# Patient Record
Sex: Female | Born: 1950
Health system: Southern US, Community
[De-identification: ages and names within clinical notes are randomized; demographics above are authoritative.]

## PROBLEM LIST (undated history)

## (undated) ENCOUNTER — Emergency Department (HOSPITAL_COMMUNITY): Admission: EM | Disposition: A | Discharge status: Home / Self Care | Payer: Medicare Other

## (undated) DIAGNOSIS — E785 Hyperlipidemia, unspecified: Secondary | ICD-10-CM

## (undated) DIAGNOSIS — H409 Unspecified glaucoma: Secondary | ICD-10-CM

## (undated) DIAGNOSIS — R609 Edema, unspecified: Secondary | ICD-10-CM

## (undated) DIAGNOSIS — R059 Cough, unspecified: Secondary | ICD-10-CM

## (undated) DIAGNOSIS — R7989 Other specified abnormal findings of blood chemistry: Secondary | ICD-10-CM

## (undated) DIAGNOSIS — I10 Essential (primary) hypertension: Secondary | ICD-10-CM

## (undated) DIAGNOSIS — M899 Disorder of bone, unspecified: Secondary | ICD-10-CM

## (undated) DIAGNOSIS — R000 Tachycardia, unspecified: Secondary | ICD-10-CM

## (undated) DIAGNOSIS — F209 Schizophrenia, unspecified: Secondary | ICD-10-CM

## (undated) DIAGNOSIS — F17200 Nicotine dependence, unspecified, uncomplicated: Secondary | ICD-10-CM

## (undated) DIAGNOSIS — R05 Cough: Secondary | ICD-10-CM

## (undated) DIAGNOSIS — M949 Disorder of cartilage, unspecified: Secondary | ICD-10-CM

## (undated) DIAGNOSIS — A562 Chlamydial infection of genitourinary tract, unspecified: Secondary | ICD-10-CM

## (undated) DIAGNOSIS — M8580 Other specified disorders of bone density and structure, unspecified site: Secondary | ICD-10-CM

## (undated) HISTORY — DX: Tachycardia, unspecified: R00.0

## (undated) HISTORY — DX: Disorder of cartilage, unspecified: M94.9

## (undated) HISTORY — DX: Cough, unspecified: R05.9

## (undated) HISTORY — DX: Edema, unspecified: R60.9

## (undated) HISTORY — DX: Nicotine dependence, unspecified, uncomplicated: F17.200

## (undated) HISTORY — DX: Essential (primary) hypertension: I10

## (undated) HISTORY — DX: Other specified disorders of bone density and structure, unspecified site: M85.80

## (undated) HISTORY — DX: Schizophrenia, unspecified: F20.9

## (undated) HISTORY — DX: Other specified abnormal findings of blood chemistry: R79.89

## (undated) HISTORY — DX: Chlamydial infection of genitourinary tract, unspecified: A56.2

## (undated) HISTORY — PX: NO PAST SURGERIES: SHX2092

## (undated) HISTORY — DX: Unspecified glaucoma: H40.9

## (undated) HISTORY — PX: BREAST EXCISIONAL BIOPSY: SUR124

## (undated) HISTORY — DX: Disorder of bone, unspecified: M89.9

## (undated) HISTORY — DX: Cough: R05

---

## 1999-04-06 ENCOUNTER — Emergency Department (HOSPITAL_COMMUNITY): Admission: EM | Admit: 1999-04-06 | Discharge: 1999-04-06 | Payer: Self-pay | Admitting: Emergency Medicine

## 1999-04-19 ENCOUNTER — Encounter: Admission: RE | Admit: 1999-04-19 | Discharge: 1999-04-22 | Payer: Self-pay | Admitting: Family Medicine

## 1999-04-23 ENCOUNTER — Encounter: Admission: RE | Admit: 1999-04-23 | Discharge: 1999-05-17 | Payer: Self-pay | Admitting: Family Medicine

## 2005-11-15 ENCOUNTER — Emergency Department (HOSPITAL_COMMUNITY): Admission: EM | Admit: 2005-11-15 | Discharge: 2005-11-15 | Payer: Self-pay | Admitting: Family Medicine

## 2008-05-22 ENCOUNTER — Emergency Department (HOSPITAL_COMMUNITY): Admission: EM | Admit: 2008-05-22 | Discharge: 2008-05-22 | Payer: Self-pay | Admitting: Family Medicine

## 2008-06-22 ENCOUNTER — Emergency Department (HOSPITAL_COMMUNITY): Admission: EM | Admit: 2008-06-22 | Discharge: 2008-06-22 | Payer: Self-pay | Admitting: Family Medicine

## 2010-05-29 ENCOUNTER — Emergency Department (HOSPITAL_COMMUNITY): Admission: EM | Admit: 2010-05-29 | Discharge: 2010-05-29 | Payer: Self-pay | Admitting: Emergency Medicine

## 2010-05-29 DIAGNOSIS — R000 Tachycardia, unspecified: Secondary | ICD-10-CM

## 2010-05-29 HISTORY — DX: Tachycardia, unspecified: R00.0

## 2011-02-04 ENCOUNTER — Encounter (HOSPITAL_COMMUNITY): Payer: 59 | Attending: Endocrinology

## 2011-02-04 DIAGNOSIS — M949 Disorder of cartilage, unspecified: Secondary | ICD-10-CM | POA: Insufficient documentation

## 2011-02-04 DIAGNOSIS — M899 Disorder of bone, unspecified: Secondary | ICD-10-CM | POA: Insufficient documentation

## 2011-04-09 ENCOUNTER — Other Ambulatory Visit (HOSPITAL_COMMUNITY): Payer: Self-pay | Admitting: Endocrinology

## 2011-04-15 ENCOUNTER — Encounter (HOSPITAL_COMMUNITY): Payer: Self-pay

## 2011-04-23 ENCOUNTER — Ambulatory Visit (HOSPITAL_COMMUNITY): Admission: RE | Admit: 2011-04-23 | Payer: Self-pay | Source: Ambulatory Visit

## 2011-04-23 ENCOUNTER — Ambulatory Visit (HOSPITAL_COMMUNITY): Payer: Self-pay

## 2011-04-23 ENCOUNTER — Encounter (HOSPITAL_COMMUNITY)
Admission: RE | Admit: 2011-04-23 | Discharge: 2011-04-23 | Disposition: A | Discharge status: Home / Self Care | Payer: PRIVATE HEALTH INSURANCE | Source: Ambulatory Visit | Attending: Endocrinology | Admitting: Endocrinology

## 2011-04-23 DIAGNOSIS — E213 Hyperparathyroidism, unspecified: Secondary | ICD-10-CM | POA: Insufficient documentation

## 2011-04-23 MED ORDER — TECHNETIUM TC 99M SESTAMIBI - CARDIOLITE
25.0000 | Freq: Once | INTRAVENOUS | Status: AC | PRN
  Administered 2011-04-23: 25 via INTRAVENOUS

## 2011-05-19 ENCOUNTER — Encounter (INDEPENDENT_AMBULATORY_CARE_PROVIDER_SITE_OTHER): Payer: Self-pay | Admitting: Surgery

## 2011-05-20 ENCOUNTER — Ambulatory Visit (INDEPENDENT_AMBULATORY_CARE_PROVIDER_SITE_OTHER): Payer: PRIVATE HEALTH INSURANCE | Admitting: Surgery

## 2011-05-20 ENCOUNTER — Encounter (INDEPENDENT_AMBULATORY_CARE_PROVIDER_SITE_OTHER): Payer: Self-pay | Admitting: Surgery

## 2011-05-20 VITALS — BP 116/68 | HR 60 | Temp 96.9°F | Ht 63.0 in | Wt 150.0 lb

## 2011-05-20 DIAGNOSIS — E210 Primary hyperparathyroidism: Secondary | ICD-10-CM

## 2011-05-20 NOTE — Progress Notes (Signed)
Chief Complaint  Patient presents with  . Parathyroid Adenoma    primary hyperparathyroidism    HISTORY: Patient is a 60 year old female referred by her endocrinologist for evaluation of suspected primary hyperparathyroidism. Patient was initially diagnosed with hypercalcemia by her primary care physician. Calcium levels of range from 11.2-11.6. Intact PTH levels have varied widely over the past several months ranging from a low of 17 to a high of 140 with the most recent level being 80. Patient does have a history of bone density scanning showing osteopenia. She denies nephrolithiasis. She denies fatigue. She denies fractures.  Patient has a history of schizophrenia. She has never taken lithium. She does take vitamin D supplements.  There is no family history of endocrinopathy.  Patient underwent nuclear medicine sestamibi scan on August 1. This study failed to localize evidence of a parathyroid adenoma. Patient is now referred to surgery for further recommendations for management.  Past Medical History  Diagnosis Date  . Hyperparathyroidism   . Schizophrenia   . Hypertension   . Osteopenia   . Vitamin D deficiency   . Cough      Current Outpatient Prescriptions  Medication Sig Dispense Refill  . alendronate (FOSAMAX) 70 MG tablet Take 70 mg by mouth every 7 (seven) days. Take with a full glass of water on an empty stomach.       Marland Kitchen atenolol (TENORMIN) 50 MG tablet Take 50 mg by mouth daily.        . brinzolamide (AZOPT) 1 % ophthalmic suspension Place 1 drop into both eyes 3 (three) times daily.        . cholecalciferol (VITAMIN D) 1000 UNITS tablet Take 2,000 Units by mouth daily.        . enalapril (VASOTEC) 10 MG tablet Take 10 mg by mouth daily.        Marland Kitchen thioridazine (MELLARIL) 50 MG tablet Take 50 mg by mouth 2 (two) times daily.       . Multiple Vitamins-Minerals (MULTIVITAMIN WITH MINERALS) tablet Take 1 tablet by mouth daily.           No Known Allergies   History  reviewed. No pertinent family history.   History   Social History  . Marital Status: Married    Spouse Name: N/A    Number of Children: N/A  . Years of Education: N/A   Social History Main Topics  . Smoking status: Never Smoker   . Smokeless tobacco: None  . Alcohol Use: Yes  . Drug Use: No  . Sexually Active:    Other Topics Concern  . None   Social History Narrative  . None     REVIEW OF SYSTEMS - PERTINENT POSITIVES ONLY: The patient denies fatigue. She does have a history of osteopenia. She denies fractures. She denies nephrolithiasis.   EXAM: Filed Vitals:   05/20/11 0934  BP: 116/68  Pulse: 60  Temp: 96.9 F (36.1 C)    HEENT: normocephalic; pupils equal and reactive; sclerae clear; dentition good; mucous membranes moist NECK:  ; symmetric on extension; no palpable anterior or posterior cervical lymphadenopathy; no supraclavicular masses; no tenderness CHEST: clear to auscultation bilaterally without rales, rhonchi, or wheezes CARDIAC: regular rate and rhythm without significant murmur; peripheral pulses are full EXT:  non-tender without edema; no deformity NEURO: no gross focal deficits; no sign of tremor   LABORATORY RESULTS: See E-Chart for most recent results   RADIOLOGY RESULTS: See E-Chart or I-Site for most recent results   IMPRESSION: #1  primary hyperparathyroidism #2 osteopenia #3 hypertension #4 schizophrenia #5 vitamin D deficiency   PLAN: The patient and I discussed all of the above findings. I have recommended an MRI scan of the neck with contrast in hopes of localizing a parathyroid adenoma. Approximately 15% of parathyroid adenoma fail to localize with nuclear medicine parathyroid scanning.  If the MRI scan indicates an abnormal parathyroid consistent with adenoma, we will consider minimally invasive surgery. If the MRI scan is unrevealing then I think the patient will return in 6 months with repeat laboratory studies her  evaluation.  We will contact the patient with the results of her MRI scan.   Velora Heckler, MD, FACS General & Endocrine Surgery [facility]      Visit Diagnoses: 1. Hyperparathyroidism, primary     Primary Care Physician: Doran Durand, MD

## 2011-05-28 ENCOUNTER — Ambulatory Visit
Admission: RE | Admit: 2011-05-28 | Discharge: 2011-05-28 | Disposition: A | Discharge status: Home / Self Care | Payer: PRIVATE HEALTH INSURANCE | Source: Ambulatory Visit | Attending: Surgery | Admitting: Surgery

## 2011-05-28 DIAGNOSIS — E210 Primary hyperparathyroidism: Secondary | ICD-10-CM

## 2011-12-18 ENCOUNTER — Other Ambulatory Visit: Payer: Self-pay | Admitting: Family Medicine

## 2011-12-18 DIAGNOSIS — A562 Chlamydial infection of genitourinary tract, unspecified: Secondary | ICD-10-CM

## 2011-12-18 DIAGNOSIS — Z1231 Encounter for screening mammogram for malignant neoplasm of breast: Secondary | ICD-10-CM

## 2011-12-18 HISTORY — DX: Chlamydial infection of genitourinary tract, unspecified: A56.2

## 2012-01-12 ENCOUNTER — Ambulatory Visit
Admission: RE | Admit: 2012-01-12 | Discharge: 2012-01-12 | Disposition: A | Discharge status: Home / Self Care | Payer: PRIVATE HEALTH INSURANCE | Source: Ambulatory Visit | Attending: Family Medicine | Admitting: Family Medicine

## 2012-01-12 DIAGNOSIS — Z1231 Encounter for screening mammogram for malignant neoplasm of breast: Secondary | ICD-10-CM

## 2012-01-12 LAB — HM MAMMOGRAPHY

## 2012-01-13 ENCOUNTER — Other Ambulatory Visit: Payer: Self-pay | Admitting: Family Medicine

## 2012-01-13 DIAGNOSIS — R928 Other abnormal and inconclusive findings on diagnostic imaging of breast: Secondary | ICD-10-CM

## 2012-01-20 ENCOUNTER — Other Ambulatory Visit: Payer: Self-pay | Admitting: Family Medicine

## 2012-01-20 ENCOUNTER — Ambulatory Visit
Admission: RE | Admit: 2012-01-20 | Discharge: 2012-01-20 | Disposition: A | Discharge status: Home / Self Care | Payer: PRIVATE HEALTH INSURANCE | Source: Ambulatory Visit | Attending: Family Medicine | Admitting: Family Medicine

## 2012-01-20 DIAGNOSIS — R928 Other abnormal and inconclusive findings on diagnostic imaging of breast: Secondary | ICD-10-CM

## 2012-02-02 ENCOUNTER — Ambulatory Visit
Admission: RE | Admit: 2012-02-02 | Discharge: 2012-02-02 | Disposition: A | Discharge status: Home / Self Care | Payer: PRIVATE HEALTH INSURANCE | Source: Ambulatory Visit | Attending: Family Medicine | Admitting: Family Medicine

## 2012-02-02 ENCOUNTER — Other Ambulatory Visit: Payer: Self-pay | Admitting: Family Medicine

## 2012-02-02 DIAGNOSIS — R928 Other abnormal and inconclusive findings on diagnostic imaging of breast: Secondary | ICD-10-CM

## 2012-04-20 ENCOUNTER — Other Ambulatory Visit: Payer: Self-pay | Admitting: Internal Medicine

## 2012-04-20 DIAGNOSIS — Z780 Asymptomatic menopausal state: Secondary | ICD-10-CM

## 2012-04-26 ENCOUNTER — Ambulatory Visit
Admission: RE | Admit: 2012-04-26 | Discharge: 2012-04-26 | Disposition: A | Discharge status: Home / Self Care | Payer: PRIVATE HEALTH INSURANCE | Source: Ambulatory Visit | Attending: Internal Medicine | Admitting: Internal Medicine

## 2012-04-26 DIAGNOSIS — Z780 Asymptomatic menopausal state: Secondary | ICD-10-CM

## 2012-07-29 ENCOUNTER — Emergency Department (INDEPENDENT_AMBULATORY_CARE_PROVIDER_SITE_OTHER)
Admission: EM | Admit: 2012-07-29 | Discharge: 2012-07-29 | Disposition: A | Discharge status: Home / Self Care | Payer: PRIVATE HEALTH INSURANCE | Source: Home / Self Care | Attending: Emergency Medicine | Admitting: Emergency Medicine

## 2012-07-29 ENCOUNTER — Encounter (HOSPITAL_COMMUNITY): Payer: Self-pay | Admitting: Emergency Medicine

## 2012-07-29 DIAGNOSIS — K5289 Other specified noninfective gastroenteritis and colitis: Secondary | ICD-10-CM

## 2012-07-29 DIAGNOSIS — K529 Noninfective gastroenteritis and colitis, unspecified: Secondary | ICD-10-CM

## 2012-07-29 LAB — POCT I-STAT, CHEM 8
Hemoglobin: 13.3 g/dL (ref 12.0–15.0)
Sodium: 143 mEq/L (ref 135–145)
TCO2: 22 mmol/L (ref 0–100)

## 2012-07-29 MED ORDER — CIPROFLOXACIN HCL 500 MG PO TABS: 500.0000 mg | ORAL_TABLET | Freq: Two times a day (BID) | ORAL | Status: DC

## 2012-07-29 MED ORDER — DIPHENOXYLATE-ATROPINE 2.5-0.025 MG PO TABS: 1.0000 | ORAL_TABLET | Freq: Four times a day (QID) | ORAL | Status: DC | PRN

## 2012-07-29 MED ORDER — OMEPRAZOLE 20 MG PO CPDR: 20.0000 mg | DELAYED_RELEASE_CAPSULE | Freq: Every day | ORAL | Status: DC

## 2012-07-29 MED ORDER — ONDANSETRON 4 MG PO TBDP
ORAL_TABLET | ORAL | Status: AC
  Filled 2012-07-29: qty 2

## 2012-07-29 MED ORDER — ONDANSETRON 4 MG PO TBDP
8.0000 mg | ORAL_TABLET | Freq: Once | ORAL | Status: AC
  Administered 2012-07-29: 8 mg via ORAL

## 2012-07-29 MED ORDER — ONDANSETRON 8 MG PO TBDP: 8.0000 mg | ORAL_TABLET | Freq: Three times a day (TID) | ORAL | Status: DC | PRN

## 2012-07-29 NOTE — ED Provider Notes (Signed)
Chief Complaint  Patient presents with  . Emesis    vomiting and diarrhea started on sat. after eating food from church.     History of Present Illness:   The patient is a 61 year old female who presents with a six-day history of nausea, vomiting, and diarrhea. This came on after she ate a plate of chitterlings that she had purchased at a church. She also has burning epigastric and periumbilical abdominal pain. She denies any fever, chills, blood in the vomitus, coffee-ground emesis, or bilious emesis. She's had no hematochezia or melena. She denies any other suspicious ingestions. She's had no prior history of GI problems. She has hypertension and takes atenolol and enalapril.  Review of Systems:  Other than noted above, the patient denies any of the following symptoms: Systemic:  No fevers, chills, sweats, weight loss or gain, fatigue, or tiredness. ENT:  No nasal congestion, rhinorrhea, or sore throat. Lungs:  No cough, wheezing, or shortness of breath. Cardiac:  No chest pain, syncope, or presyncope. GI:  No abdominal pain, nausea, vomiting, anorexia, diarrhea, constipation, blood in stool or vomitus. GU:  No dysuria, frequency, or urgency.  PMFSH:  Past medical history, family history, social history, meds, and allergies were reviewed.  Physical Exam:   Vital signs:  BP 148/77  Pulse 91  Temp 99 F (37.2 C) (Oral)  Resp 16  SpO2 97% Filed Vitals:   07/29/12 1709 07/29/12 1758 Supine  180011/07/13  Sitting  07/29/12 1801 Standing   BP: 152/74 137/87 148/78 148/77  Pulse: 95 90 91 91  Temp: 99 F (37.2 C)     TempSrc: Oral     Resp: 16     SpO2: 97%      General:  Alert and oriented.  In no distress.  Skin warm and dry.  Good skin turgor, brisk capillary refill. ENT:  No scleral icterus, moist mucous membranes, no oral lesions, pharynx clear. Lungs:  Breath sounds clear and equal bilaterally.  No wheezes, rales, or rhonchi. Heart:  Rhythm regular, without extrasystoles.   No gallops or murmers. Abdomen:  Soft, flat, nondistended. There is slight pain to palpation in the epigastrium and the periumbilical area but no guarding or rebound. No organomegaly or mass. Bowel sounds are hyperactive. Skin: Clear, warm, and dry.  Good turgor.  Brisk capillary refill.  Labs:   Results for orders placed during the hospital encounter of 07/29/12  POCT I-STAT, CHEM 8      Component Value Range   Sodium 143  135 - 145 mEq/L   Potassium 2.9 (*) 3.5 - 5.1 mEq/L   Chloride 107  96 - 112 mEq/L   BUN 6  6 - 23 mg/dL   Creatinine, Ser 1.61  0.50 - 1.10 mg/dL   Glucose, Bld 096 (*) 70 - 99 mg/dL   Calcium, Ion 0.45 (*) 1.12 - 1.23 mmol/L   TCO2 22  0 - 100 mmol/L   Hemoglobin 13.3  12.0 - 15.0 g/dL   HCT 40.9  81.1 - 91.4 %    Assessment:  The encounter diagnosis was Gastroenteritis.  This could be viral although it suspicious that she might have gotten an infection from the plate of chitterlings that she ate.  Plan:   1.  The following meds were prescribed:   New Prescriptions   CIPROFLOXACIN (CIPRO) 500 MG TABLET    Take 1 tablet (500 mg total) by mouth every 12 (twelve) hours.   DIPHENOXYLATE-ATROPINE (LOMOTIL) 2.5-0.025 MG PER TABLET  Take 1 tablet by mouth 4 (four) times daily as needed for diarrhea or loose stools.   OMEPRAZOLE (PRILOSEC) 20 MG CAPSULE    Take 1 capsule (20 mg total) by mouth daily.   ONDANSETRON (ZOFRAN ODT) 8 MG DISINTEGRATING TABLET    Take 1 tablet (8 mg total) by mouth every 8 (eight) hours as needed for nausea.   2.  The patient was instructed in symptomatic care and handouts were given. 3.  The patient was told to return if becoming worse in any way, if no better in 2 or 3 days, and given some red flag symptoms that would indicate earlier return. 4.  The patient was told to take only sips of clear liquids for the next 24 hours and then advance to a b.r.a.t. Diet.      Reuben Likes, MD 07/29/12 2128

## 2012-07-29 NOTE — ED Notes (Signed)
Pt c/o of vomiting episode on Saturday after eating food from church.   Sunday things got better. Pt states that she started to have the same symptoms again today sudden on set  Nausea/vomiting x2 and diarrhea. Pt states she has a burning sensation in her stomach.   Pt has used tums and tylenol with no relief of pain.

## 2012-12-23 ENCOUNTER — Other Ambulatory Visit: Payer: Self-pay

## 2012-12-23 DIAGNOSIS — Z1231 Encounter for screening mammogram for malignant neoplasm of breast: Secondary | ICD-10-CM

## 2013-01-07 ENCOUNTER — Other Ambulatory Visit (HOSPITAL_BASED_OUTPATIENT_CLINIC_OR_DEPARTMENT_OTHER): Payer: Self-pay | Admitting: Internal Medicine

## 2013-01-25 ENCOUNTER — Ambulatory Visit
Admission: RE | Admit: 2013-01-25 | Discharge: 2013-01-25 | Disposition: A | Discharge status: Home / Self Care | Payer: PRIVATE HEALTH INSURANCE | Source: Ambulatory Visit

## 2013-01-25 DIAGNOSIS — Z1231 Encounter for screening mammogram for malignant neoplasm of breast: Secondary | ICD-10-CM

## 2013-02-15 ENCOUNTER — Encounter: Payer: Self-pay | Admitting: *Deleted

## 2013-02-16 ENCOUNTER — Encounter: Payer: Self-pay | Admitting: Internal Medicine

## 2013-02-16 ENCOUNTER — Ambulatory Visit (INDEPENDENT_AMBULATORY_CARE_PROVIDER_SITE_OTHER): Payer: Medicare Other | Admitting: Internal Medicine

## 2013-02-16 VITALS — BP 138/76 | HR 74 | Temp 98.0°F | Resp 15 | Ht 63.0 in | Wt 157.0 lb

## 2013-02-16 DIAGNOSIS — F209 Schizophrenia, unspecified: Secondary | ICD-10-CM

## 2013-02-16 DIAGNOSIS — M1612 Unilateral primary osteoarthritis, left hip: Secondary | ICD-10-CM

## 2013-02-16 DIAGNOSIS — Z0000 Encounter for general adult medical examination without abnormal findings: Secondary | ICD-10-CM

## 2013-02-16 DIAGNOSIS — M169 Osteoarthritis of hip, unspecified: Secondary | ICD-10-CM

## 2013-02-16 DIAGNOSIS — H409 Unspecified glaucoma: Secondary | ICD-10-CM

## 2013-02-16 DIAGNOSIS — I10 Essential (primary) hypertension: Secondary | ICD-10-CM

## 2013-02-16 DIAGNOSIS — K219 Gastro-esophageal reflux disease without esophagitis: Secondary | ICD-10-CM

## 2013-02-16 DIAGNOSIS — M810 Age-related osteoporosis without current pathological fracture: Secondary | ICD-10-CM

## 2013-02-16 DIAGNOSIS — E785 Hyperlipidemia, unspecified: Secondary | ICD-10-CM

## 2013-02-16 NOTE — Progress Notes (Signed)
Subjective:    Patient ID: [name], female    DOB: 1951-02-23, 62 y.o.   MRN: [id number]  Chief Complaint  Patient presents with  . Annual Exam    Complains of Left hip pain   No Known Allergies   HPI  General exam- mammogram 12/23/12 normal. dexa scan 2012 showed ? osteoporosis as per patient. Normal pap smear 11/2011. uptodate with flu and tdap vaccine. Has not taken shingles vaccine she wants to read about it before deciding on taking it.  Left hip pain- She has been having pain in her left hip with trying to change position and then after she gets up, it goes away. Pain is 3/10, intermittent, non radiating, has not taken pain medication, no numbness/ tingling  Follows with dr balan from endocrinology for hyperparathyroidism  Taking her cholesterol medication and bp meds  Reflux symptoms are minimal. No further nausea  Review of Systems  Constitutional: Negative for fever, chills, appetite change and unexpected weight change.  HENT: Negative for hearing loss, ear pain, mouth sores and neck pain.   Eyes: Negative for visual disturbance.       Wears corrective lenses, seen by eye doctor in April 2014, using her eye drops for glaucoma  Respiratory: Negative for cough, chest tightness and shortness of breath.   Cardiovascular: Negative for chest pain, palpitations and leg swelling.  Gastrointestinal: Negative for nausea, vomiting, abdominal pain, diarrhea, constipation and blood in stool.  Endocrine: Negative for polydipsia, polyphagia and polyuria.  Genitourinary: Negative for dysuria, frequency, hematuria, flank pain and vaginal discharge.  Musculoskeletal: Negative for back pain, joint swelling and gait problem.  Skin: Negative for pallor and rash.  Neurological: Negative for dizziness, tremors, light-headedness and headaches.  Hematological: Negative for adenopathy.  Psychiatric/Behavioral: Negative for suicidal ideas, behavioral problems, sleep disturbance and  agitation.    Past Medical History  Diagnosis Date  . Hyperparathyroidism   . Schizophrenia   . Hypertension   . Osteopenia   . Vitamin D deficiency   . Cough   . Other abnormal blood chemistry   . Chlamydia trachomatis infection of unspecified genitourinary site 12/18/2011  . Other and unspecified hyperlipidemia   . Disorder of bone and cartilage, unspecified   . Edema   . Tobacco use disorder   . Tachycardia, unspecified 05/29/2010  . Unspecified glaucoma(365.9)    History reviewed. No pertinent past surgical history.  Family History  Problem Relation Age of Onset  . Hypertension Other   . Diabetes Other   . Cancer Mother   . Cancer Brother     Throat   History   Social History  . Marital Status: Married    Spouse Name: N/A    Number of Children: N/A  . Years of Education: N/A   Occupational History  . Not on file.   Social History Main Topics  . Smoking status: Never Smoker   . Smokeless tobacco: Not on file  . Alcohol Use: Yes  . Drug Use: No  . Sexually Active: Not on file   Other Topics Concern  . Not on file   Social History Narrative  . No narrative on file      Objective:   Physical Exam  Constitutional: She is oriented to person, place, and time. She appears well-developed and well-nourished. No distress.  HENT:  Head: Normocephalic and atraumatic.  Right Ear: External ear normal.  Left Ear: External ear normal.  Nose: Nose normal.  Mouth/Throat: No oropharyngeal exudate.  Eyes: Conjunctivae  and EOM are normal. Pupils are equal, round, and reactive to light. Right eye exhibits no discharge. Left eye exhibits no discharge. No scleral icterus.  Neck: Normal range of motion. Neck supple. No JVD present. No tracheal deviation present. No thyromegaly present.  No carotid bruits  Cardiovascular: Normal rate, regular rhythm, normal heart sounds and intact distal pulses.   No murmur heard. Pulmonary/Chest: Effort normal and breath sounds normal.  No stridor. No respiratory distress. She has no wheezes. She has no rales.  Abdominal: Soft. Bowel sounds are normal. She exhibits no distension and no mass. There is no tenderness. There is no rebound and no guarding.  Genitourinary: Vagina normal. No vaginal discharge found.  Musculoskeletal: Normal range of motion. She exhibits no edema and no tenderness.  Lymphadenopathy:    She has no cervical adenopathy.  Neurological: She is alert and oriented to person, place, and time. She has normal reflexes. No cranial nerve deficit. Coordination normal.  Skin: Skin is warm and dry. No rash noted. She is not diaphoretic. No erythema. No pallor.  Psychiatric: She has a normal mood and affect. Her behavior is normal. Judgment and thought content normal.    BP 138/76  Pulse 74  Temp(Src) 98 F (36.7 C) (Oral)  Resp 15  Ht 5\' 3"  (1.6 m)  Wt 157 lb (71.215 kg)  BMI 27.82 kg/m2  Labs-  12/16/2011 CBC Wbc 9.5 Rbc 4.11 Hemoglobin 12.3  CMP Glucose 101 Bun 9 Creatinine 0.69 Lipid Panel Cholesterol 222 Triglycerides 322 Hdl 45 Ldl 113  TSH 1.960  Vitamin D 25 Hydroxy 24.3  03/23/12 A1c 5.5 10/14/2012 CBC; WBC 6.2, RBC 4.38, Hemoglobin 13.2 CMP; Glucose 104, BUN 8, Creatinine 0.72 Lipid Panel; Cholesterol 155, Triglycerides 200, HDL 57, LDL 58     CBC    Component Value Date/Time   HGB 13.3 07/29/2012 1816   HCT 39.0 07/29/2012 1816    CMP     Component Value Date/Time   NA 143 07/29/2012 1816   K 2.9* 07/29/2012 1816   CL 107 07/29/2012 1816   GLUCOSE 106* 07/29/2012 1816   BUN 6 07/29/2012 1816   CREATININE 0.80 07/29/2012 1816   ekg reviewed, NSR, t wave abnormality in high lateral leads  Assessment & Plan:   Osteoporosis- tolerating alendronate well. Continue current regimen. Weight bearing exercise encouraged. Continue vit d  Hyperparathyroidism- follows with dr balan  HTN- bp well controlled. Continue current regimen. No changes made. Monitor renal function  Hyperlipidemia-  continue current regimen of statin  gerd- symptoms currently under control, continue ppi  Schizophrenia- takes thioridazine and sympotms stable at present. Monitor clinically  Glaucoma- follows with eye MD  General exam- uptodate with mammogram and dexa scan. upotdate with other immunization except shingles.   Left hip pain- likley arthritis pain with minimal discomfort, to take prn tylenol for now and notify if no improvement

## 2013-02-21 ENCOUNTER — Other Ambulatory Visit: Payer: Self-pay | Admitting: *Deleted

## 2013-02-21 ENCOUNTER — Other Ambulatory Visit: Payer: Self-pay | Admitting: Internal Medicine

## 2013-02-23 DIAGNOSIS — M1612 Unilateral primary osteoarthritis, left hip: Secondary | ICD-10-CM | POA: Insufficient documentation

## 2013-02-23 DIAGNOSIS — F209 Schizophrenia, unspecified: Secondary | ICD-10-CM | POA: Insufficient documentation

## 2013-02-23 DIAGNOSIS — K219 Gastro-esophageal reflux disease without esophagitis: Secondary | ICD-10-CM | POA: Insufficient documentation

## 2013-02-23 DIAGNOSIS — Z0000 Encounter for general adult medical examination without abnormal findings: Secondary | ICD-10-CM | POA: Insufficient documentation

## 2013-02-23 DIAGNOSIS — H409 Unspecified glaucoma: Secondary | ICD-10-CM | POA: Insufficient documentation

## 2013-02-23 DIAGNOSIS — M810 Age-related osteoporosis without current pathological fracture: Secondary | ICD-10-CM | POA: Insufficient documentation

## 2013-02-23 DIAGNOSIS — E785 Hyperlipidemia, unspecified: Secondary | ICD-10-CM | POA: Insufficient documentation

## 2013-02-23 DIAGNOSIS — I10 Essential (primary) hypertension: Secondary | ICD-10-CM | POA: Insufficient documentation

## 2013-03-02 LAB — FECAL OCCULT BLOOD, IMMUNOCHEMICAL: Fecal Occult Bld: NEGATIVE

## 2013-03-29 ENCOUNTER — Other Ambulatory Visit: Payer: Self-pay | Admitting: Geriatric Medicine

## 2013-03-29 MED ORDER — ENALAPRIL MALEATE 10 MG PO TABS: 10.0000 mg | ORAL_TABLET | Freq: Every day | ORAL | Status: DC

## 2013-05-04 ENCOUNTER — Other Ambulatory Visit: Payer: Self-pay | Admitting: Geriatric Medicine

## 2013-05-04 MED ORDER — ATORVASTATIN CALCIUM 10 MG PO TABS: 10.0000 mg | ORAL_TABLET | Freq: Every day | ORAL | Status: DC

## 2013-05-06 ENCOUNTER — Other Ambulatory Visit: Payer: Self-pay | Admitting: *Deleted

## 2013-05-06 MED ORDER — ATORVASTATIN CALCIUM 10 MG PO TABS: ORAL_TABLET | ORAL | Status: DC

## 2013-06-10 ENCOUNTER — Encounter: Payer: Self-pay | Admitting: *Deleted

## 2013-06-14 ENCOUNTER — Encounter: Payer: Self-pay | Admitting: Internal Medicine

## 2013-06-14 ENCOUNTER — Ambulatory Visit
Admission: RE | Admit: 2013-06-14 | Discharge: 2013-06-14 | Disposition: A | Discharge status: Home / Self Care | Payer: PRIVATE HEALTH INSURANCE | Source: Ambulatory Visit | Attending: Internal Medicine | Admitting: Internal Medicine

## 2013-06-14 ENCOUNTER — Ambulatory Visit (INDEPENDENT_AMBULATORY_CARE_PROVIDER_SITE_OTHER): Payer: Medicare Other | Admitting: Internal Medicine

## 2013-06-14 VITALS — BP 142/86 | HR 74 | Temp 97.9°F | Wt 159.0 lb

## 2013-06-14 DIAGNOSIS — M79609 Pain in unspecified limb: Secondary | ICD-10-CM

## 2013-06-14 DIAGNOSIS — Z23 Encounter for immunization: Secondary | ICD-10-CM | POA: Insufficient documentation

## 2013-06-14 DIAGNOSIS — M79671 Pain in right foot: Secondary | ICD-10-CM | POA: Insufficient documentation

## 2013-06-14 DIAGNOSIS — I10 Essential (primary) hypertension: Secondary | ICD-10-CM

## 2013-06-14 DIAGNOSIS — E785 Hyperlipidemia, unspecified: Secondary | ICD-10-CM

## 2013-06-14 DIAGNOSIS — K219 Gastro-esophageal reflux disease without esophagitis: Secondary | ICD-10-CM

## 2013-06-14 DIAGNOSIS — M810 Age-related osteoporosis without current pathological fracture: Secondary | ICD-10-CM

## 2013-06-14 MED ORDER — NAPROXEN 500 MG PO TABS: 500.0000 mg | ORAL_TABLET | Freq: Two times a day (BID) | ORAL | Status: DC

## 2013-06-14 NOTE — Patient Instructions (Signed)
  If you do not see improvement in your foot pain by the end of this week or notice further worsening of pain or swelling, please get in touch with the office for further evaluation

## 2013-06-14 NOTE — Progress Notes (Signed)
Patient ID: [name], female   DOB: 1951/05/26, 62 y.o.   MRN: [id number]  Chief Complaint  Patient presents with  . Medical Managment of Chronic Issues    4 month follow-up   . Foot Problem    Right foot injury x 1 month or longer- patient c/o of foot swelling and pain    No Known Allergies  HPI 62 y/o female patient is here for routine follow up.Today she complaints of pain in the right foot since last Thursday. She was at her cousin's funeral and was standing for 2 hours and was wearing heels. Finally when she sat down, she felt her right foot to be hurting. The pain is in medial side of great toe and foot. She has noticed some swelling in that area. She has applied ice pack, soaked in epsom salt and had ACE bandage on. She also took some aleve 220 mg which helped with the pain. At present she complaints of soreness and pain and grades it as 8/10. She also mentions about having trauma in the same foot several month back when meat from the freezer compartment had fallen on her leg. That pain had resolved though. She is using a crutch at present No new change in her diet Denies any known history of gout She is complaint with her medications She also has hx of osteoporosis and hyperparathyroidism and takes fosamax and vtamin d Her left hip pain has resolved Follows with dr Talmage Nap from endocrinology for hyperparathyroidism  Review of Systems  Constitutional: Negative for fever, chills, appetite change and unexpected weight change.  HENT: Negative for hearing loss, ear pain, mouth sores and neck pain.   Eyes: Negative for visual disturbance.        Wears corrective lenses, seen by eye doctor in April 2014, using her eye drops for glaucoma  Respiratory: Negative for cough, chest tightness and shortness of breath.   Cardiovascular: Negative for chest pain, palpitations and leg swelling.  Gastrointestinal: Negative for nausea, vomiting, abdominal pain, diarrhea, constipation and blood in  stool.  Endocrine: Negative for polydipsia, polyphagia and polyuria.  Genitourinary: Negative for dysuria, frequency, hematuria, flank pain and vaginal discharge.  Musculoskeletal: Negative for back pain Skin: Negative for pallor and rash.  Neurological: Negative for dizziness, tremors, light-headedness and headaches.  Hematological: Negative for adenopathy.  Psychiatric/Behavioral: Negative for suicidal ideas, behavioral problems, sleep disturbance and agitation.   Past Medical History  Diagnosis Date  . Hyperparathyroidism   . Schizophrenia   . Hypertension   . Osteopenia   . Vitamin D deficiency   . Cough   . Other abnormal blood chemistry   . Chlamydia trachomatis infection of unspecified genitourinary site 12/18/2011  . Other and unspecified hyperlipidemia   . Disorder of bone and cartilage, unspecified   . Edema   . Tobacco use disorder   . Tachycardia, unspecified 05/29/2010  . Unspecified glaucoma(365.9)    History reviewed. No pertinent past surgical history.  Medication reviewed  Physical Exam   BP 142/86  Pulse 74  Temp(Src) 97.9 F (36.6 C) (Oral)  Wt 159 lb (72.122 kg)  BMI 28.17 kg/m2  SpO2 99%  Constitutional: She is oriented to person, place, and time. She appears well-developed and well-nourished. No distress.  HENT:   Head: Normocephalic and atraumatic.  Nose: Nose normal.   Mouth/Throat: No oropharyngeal exudate.  Eyes: Conjunctivae and EOM are normal. Pupils are equal, round, and reactive to light. Right eye exhibits no discharge. Left eye exhibits no discharge.  No scleral icterus.  Neck: Normal range of motion. Neck supple. No JVD present. No tracheal deviation present. No thyromegaly present.  Cardiovascular: Normal rate, regular rhythm, normal heart sounds and intact distal pulses.    No murmur heard. Pulmonary/Chest: Effort normal and breath sounds normal. No stridor. No respiratory distress. She has no wheezes. She has no rales.  Abdominal:  Soft. Bowel sounds are normal. She exhibits no distension and no mass. There is no tenderness. There is no rebound and no guarding.  Musculoskeletal: tenderness on right foot in the metatarsal area, swelling noted, no warmth. Has discomfort on great toe flexion. Good dorsalis pedis pulse  Lymphadenopathy:    She has no cervical adenopathy.  Neurological: She is alert and oriented to person, place, and time. She has normal reflexes. No cranial nerve deficit. Coordination normal.  Skin: Skin is warm and dry. No rash noted. She is not diaphoretic. No erythema. No pallor.  Psychiatric: She has a normal mood and affect. Her behavior is normal. Judgment and thought content normal.    Assessment/plan  Right foot pain- likely from trauma with her prolonged standing on her heels causing inflammation and possible tendonitis.will get foot xray to rule out metatarsal fracture given her osteoporosis and hyperparathyroidism history.  Another possibility is acute gout. Check uric acid level. To avid weight bearing until xray rules out fracture, foot rest and naprosyn 500 mg bid for 5 days, heat pack. Reassess if no improvement  Osteoporosis- tolerating alendronate and vitamin d well. Continue current regimen.  HTN- bp well controlled. Continue current regimen. No changes made. Monitor renal function  gerd- symptoms currently under control, continue ppi

## 2013-06-15 ENCOUNTER — Telehealth: Payer: Self-pay

## 2013-06-15 ENCOUNTER — Ambulatory Visit: Payer: Medicare Other | Admitting: Internal Medicine

## 2013-06-15 MED ORDER — ALLOPURINOL 100 MG PO TABS: 100.0000 mg | ORAL_TABLET | Freq: Three times a day (TID) | ORAL | Status: DC

## 2013-06-15 MED ORDER — ALLOPURINOL 100 MG PO TABS: 100.0000 mg | ORAL_TABLET | Freq: Every day | ORAL | Status: DC

## 2013-06-15 NOTE — Telephone Encounter (Signed)
Message copied by Maurice Small on Wed Jun 15, 2013  1:28 PM ------      Message from: Kimber Relic      Created: Wed Jun 15, 2013 11:17 AM       Call patient. Uric acid is high. This is consistent with acute gout. Start allopurinol 100 mg (#100) One 3 times daily to help gout. Continue naprosyn until pain is better. ------

## 2013-06-15 NOTE — Telephone Encounter (Signed)
Discussed with patient, patient verbalized understanding of results. RX sent to pharmacy   

## 2013-06-24 ENCOUNTER — Other Ambulatory Visit (HOSPITAL_BASED_OUTPATIENT_CLINIC_OR_DEPARTMENT_OTHER): Payer: Self-pay | Admitting: Internal Medicine

## 2013-09-06 ENCOUNTER — Encounter: Payer: Medicare Other | Admitting: Internal Medicine

## 2013-09-08 NOTE — Progress Notes (Signed)
This encounter was created in error - please disregard.

## 2013-09-14 ENCOUNTER — Other Ambulatory Visit: Payer: Self-pay | Admitting: Nurse Practitioner

## 2013-09-21 ENCOUNTER — Other Ambulatory Visit: Payer: Self-pay | Admitting: Internal Medicine

## 2013-11-23 ENCOUNTER — Other Ambulatory Visit: Payer: Self-pay | Admitting: Internal Medicine

## 2013-12-20 ENCOUNTER — Encounter: Payer: Self-pay | Admitting: Internal Medicine

## 2013-12-20 ENCOUNTER — Ambulatory Visit (INDEPENDENT_AMBULATORY_CARE_PROVIDER_SITE_OTHER): Payer: Medicare Other | Admitting: Internal Medicine

## 2013-12-20 VITALS — BP 120/70 | HR 76 | Temp 99.5°F | Wt 154.0 lb

## 2013-12-20 DIAGNOSIS — E785 Hyperlipidemia, unspecified: Secondary | ICD-10-CM

## 2013-12-20 DIAGNOSIS — I10 Essential (primary) hypertension: Secondary | ICD-10-CM

## 2013-12-20 DIAGNOSIS — M810 Age-related osteoporosis without current pathological fracture: Secondary | ICD-10-CM

## 2013-12-20 DIAGNOSIS — H409 Unspecified glaucoma: Secondary | ICD-10-CM

## 2013-12-20 DIAGNOSIS — K219 Gastro-esophageal reflux disease without esophagitis: Secondary | ICD-10-CM

## 2013-12-20 MED ORDER — ATENOLOL 50 MG PO TABS: 50.0000 mg | ORAL_TABLET | Freq: Every day | ORAL | Status: DC

## 2013-12-20 NOTE — Progress Notes (Signed)
Patient ID: [name], female   DOB: 1950/11/20, 63 y.o.   MRN: [id number]     Chief Complaint  Patient presents with  . Medical Managment of Chronic Issues    Follow-up (last OV 06/14/2014)   No Known Allergies  HPI 63 y/o female patient is here for routine follow up. She denies any complaints this visit. She has been walking for exercise and has been careful about her diet. She has cut down on salt intake and eats more vegetables. She is complaint with her medications. She has history of hypertension and osteoporosis. She Follows with dr Talmage Nap from endocrinology for hyperparathyroidism  Review of Systems   Constitutional: Negative for fever, chills, appetite change. She has been exercising and lost 5 lbs since last visit. HENT: Negative for hearing loss, ear pain, mouth sores and neck pain.    Eyes: Negative for visual disturbance. Wears corrective lenses, seen by eye doctor feb 2015.using her eye drops for glaucoma  Respiratory: Negative for cough, chest tightness and shortness of breath.    Cardiovascular: Negative for chest pain, palpitations and leg swelling.   Gastrointestinal: Negative for nausea, vomiting, abdominal pain, diarrhea, constipation and blood in stool.   Endocrine: Negative for polydipsia, polyphagia and polyuria.   Genitourinary: Negative for dysuria, frequency, hematuria, flank pain and vaginal discharge.   Musculoskeletal: Negative for back pain Skin: Negative for pallor and rash.   Neurological: Negative for dizziness, tremors, light-headedness and headaches.   Hematological: Negative for adenopathy.   Psychiatric/Behavioral: Negative for suicidal ideas, behavioral problems, sleep disturbance and agitation.     Past Medical History  Diagnosis Date  . Hyperparathyroidism   . Schizophrenia   . Hypertension   . Osteopenia   . Vitamin D deficiency   . Cough   . Other abnormal blood chemistry   . Chlamydia trachomatis infection of unspecified  genitourinary site 12/18/2011  . Other and unspecified hyperlipidemia   . Disorder of bone and cartilage, unspecified   . Edema   . Tobacco use disorder   . Tachycardia, unspecified 05/29/2010  . Unspecified glaucoma    Current Outpatient Prescriptions on File Prior to Visit  Medication Sig Dispense Refill  . alendronate (FOSAMAX) 70 MG tablet Take 70 mg by mouth every 7 (seven) days. Take 1 tablet once weekly to treat osteoporosis.Take with a full glass of water on an empty stomach.      Marland Kitchen atenolol (TENORMIN) 50 MG tablet TAKE ONE AND A HALF TABLET BY MOUTH DAILY FOR BLOOD PRESSURE  45 tablet  5  . atorvastatin (LIPITOR) 10 MG tablet Take one tablet once a day at bedtime for cholesterol  30 tablet  5  . bimatoprost (LUMIGAN) 0.03 % ophthalmic solution 1 drop at bedtime. One drop in both eyes once a day for glaucoma      . brinzolamide (AZOPT) 1 % ophthalmic suspension Place 1 drop into both eyes 3 (three) times daily.        . cholecalciferol (VITAMIN D) 1000 UNITS tablet Take 2,000 Units by mouth daily.        . enalapril (VASOTEC) 10 MG tablet TAKE 1 TABLET (10 MG TOTAL) BY MOUTH DAILY.  30 tablet  3  . Multiple Vitamins-Minerals (MULTIVITAMIN WITH MINERALS) tablet Take 1 tablet by mouth daily. Take 1/2 tablet daily.      Marland Kitchen omeprazole (PRILOSEC) 20 MG capsule Take 1 capsule (20 mg total) by mouth daily.  15 capsule  0  . thioridazine (MELLARIL) 50 MG tablet Take  50 mg by mouth. Take 2 tablets at bedtime.       No current facility-administered medications on file prior to visit.   History reviewed. No pertinent past surgical history.  Physical exam BP 120/70  Pulse 76  Temp(Src) 99.5 F (37.5 C) (Oral)  Wt 154 lb (69.854 kg)  SpO2 98%  Constitutional: She is oriented to person, place, and time. She appears well-developed and well-nourished. No distress.  HENT:   Head: Normocephalic and atraumatic.  Nose: Nose normal.   Mouth/Throat: No oropharyngeal exudate.  Eyes:  Conjunctivae and EOM are normal. Pupils are equal, round, and reactive to light. Right eye exhibits no discharge. Left eye exhibits no discharge. No scleral icterus.  Neck: Normal range of motion. Neck supple. No JVD present. No tracheal deviation present. No thyromegaly present.No carotid bruits  Cardiovascular: Normal rate, regular rhythm, normal heart sounds and intact distal pulses.    No murmur heard. Pulmonary/Chest: Effort normal and breath sounds normal. No stridor. No respiratory distress. She has no wheezes. She has no rales.  Abdominal: Soft. Bowel sounds are normal. She exhibits no distension and no mass.  Musculoskeletal: Normal range of motion. She exhibits no edema and no tenderness.  Lymphadenopathy: She has no cervical adenopathy.  Neurological: She is alert and oriented to person, place, and time.  Skin: Skin is warm and dry. No rash noted. She is not diaphoretic. No erythema. No pallor.  Psychiatric: She has a normal mood and affect. Her behavior is normal. Judgment and thought content normal.   Labs- CBC    Component Value Date/Time   HGB 13.3 07/29/2012 1816   HCT 39.0 07/29/2012 1816    CMP     Component Value Date/Time   NA 143 07/29/2012 1816   K 2.9* 07/29/2012 1816   CL 107 07/29/2012 1816   GLUCOSE 106* 07/29/2012 1816   BUN 6 07/29/2012 1816   CREATININE 0.80 07/29/2012 1816     Assessment/plan  1. Unspecified essential hypertension Improved bp reading. Has lost weight, congratulated on exercising and losing weight. Will continue enalapril for now and decrease atenolol to 50 from 75 mg daily - CMP  2. GERD (gastroesophageal reflux disease) Stable. Continue prilosec 20 mg daily for now and  Consider tapering off if tolerated next visit - CBC with Differential  3. Osteoporosis, unspecified Stable. Continue alendronate for now with vitamin d - Vitamin D, 1,25-dihydroxy  4. Hyperlipidemia Continue lipitor 10 mg daily and check lipid panel - Lipid  Panel  5. Glaucoma Continue azopt and lumigan for now

## 2013-12-22 ENCOUNTER — Telehealth: Payer: Self-pay

## 2013-12-22 DIAGNOSIS — E876 Hypokalemia: Secondary | ICD-10-CM

## 2013-12-22 LAB — COMPREHENSIVE METABOLIC PANEL
A/G RATIO: 2.2 (ref 1.1–2.5)
ALT: 52 IU/L — AB (ref 0–32)
AST: 55 IU/L — AB (ref 0–40)
Albumin: 4.2 g/dL (ref 3.6–4.8)
Alkaline Phosphatase: 71 IU/L (ref 39–117)
BUN/Creatinine Ratio: 7 — ABNORMAL LOW (ref 11–26)
BUN: 5 mg/dL — ABNORMAL LOW (ref 8–27)
CHLORIDE: 106 mmol/L (ref 97–108)
CO2: 24 mmol/L (ref 18–29)
Calcium: 11.2 mg/dL — ABNORMAL HIGH (ref 8.7–10.3)
Creatinine, Ser: 0.68 mg/dL (ref 0.57–1.00)
GFR calc Af Amer: 109 mL/min/{1.73_m2} (ref >59)
GFR, EST NON AFRICAN AMERICAN: 95 mL/min/{1.73_m2} (ref >59)
GLUCOSE: 96 mg/dL (ref 65–99)
Globulin, Total: 1.9 g/dL (ref 1.5–4.5)
POTASSIUM: 3 mmol/L — AB (ref 3.5–5.2)
SODIUM: 149 mmol/L — AB (ref 134–144)
Total Bilirubin: 0.8 mg/dL (ref 0.0–1.2)
Total Protein: 6.1 g/dL (ref 6.0–8.5)

## 2013-12-22 LAB — CBC WITH DIFFERENTIAL/PLATELET
Basophils Absolute: 0 10*3/uL (ref 0.0–0.2)
Basos: 0 %
EOS ABS: 0 10*3/uL (ref 0.0–0.4)
EOS: 1 %
HEMATOCRIT: 37.2 % (ref 34.0–46.6)
Hemoglobin: 12.8 g/dL (ref 11.1–15.9)
IMMATURE GRANS (ABS): 0 10*3/uL (ref 0.0–0.1)
IMMATURE GRANULOCYTES: 0 %
LYMPHS ABS: 3.7 10*3/uL — AB (ref 0.7–3.1)
Lymphs: 52 %
MCH: 31.3 pg (ref 26.6–33.0)
MCHC: 34.4 g/dL (ref 31.5–35.7)
MCV: 91 fL (ref 79–97)
MONOS ABS: 0.5 10*3/uL (ref 0.1–0.9)
Monocytes: 7 %
NEUTROS PCT: 40 %
Neutrophils Absolute: 2.8 10*3/uL (ref 1.4–7.0)
RBC: 4.09 x10E6/uL (ref 3.77–5.28)
RDW: 14.4 % (ref 12.3–15.4)
WBC: 7.1 10*3/uL (ref 3.4–10.8)

## 2013-12-22 LAB — LIPID PANEL
CHOLESTEROL TOTAL: 128 mg/dL (ref 100–199)
Chol/HDL Ratio: 2 ratio units (ref 0.0–4.4)
HDL: 63 mg/dL (ref >39)
LDL Calculated: 32 mg/dL (ref 0–99)
TRIGLYCERIDES: 165 mg/dL — AB (ref 0–149)
VLDL CHOLESTEROL CAL: 33 mg/dL (ref 5–40)

## 2013-12-22 LAB — VITAMIN D 1,25 DIHYDROXY: Vit D, 1,25-Dihydroxy: 36.8 pg/mL (ref 19.9–79.3)

## 2013-12-22 MED ORDER — POTASSIUM CHLORIDE CRYS ER 20 MEQ PO TBCR: 20.0000 meq | EXTENDED_RELEASE_TABLET | Freq: Two times a day (BID) | ORAL | Status: DC

## 2013-12-22 NOTE — Telephone Encounter (Signed)
Discussed with patient, [facility] scheduled appointment for labs on 01/03/14 (patient will not pick-up rx until 12/27/12). Future order placed for labs

## 2013-12-22 NOTE — Telephone Encounter (Signed)
Message copied by Maurice SmallBEATTY, Carsin Randazzo C on Thu Dec 22, 2013  4:03 PM ------      Message from: Oneal GroutPANDEY, MAHIMA      Created: Wed Dec 21, 2013  7:00 PM       Pending vitamin d level. You have low potassium level. Send script for potassium chloride 20 meq twice a day for 1 week and recheck bmp. Your sodium level is high. I want you to keep yourself hydrated for now and cut down salt intake. ------

## 2014-01-03 ENCOUNTER — Other Ambulatory Visit: Payer: Medicare Other

## 2014-01-03 DIAGNOSIS — E876 Hypokalemia: Secondary | ICD-10-CM

## 2014-01-04 LAB — BASIC METABOLIC PANEL
BUN/Creatinine Ratio: 9 — ABNORMAL LOW (ref 11–26)
BUN: 6 mg/dL — ABNORMAL LOW (ref 8–27)
CO2: 25 mmol/L (ref 18–29)
Calcium: 11.3 mg/dL — ABNORMAL HIGH (ref 8.7–10.3)
Chloride: 108 mmol/L (ref 97–108)
Creatinine, Ser: 0.68 mg/dL (ref 0.57–1.00)
GFR calc Af Amer: 109 mL/min/{1.73_m2} (ref >59)
GFR calc non Af Amer: 95 mL/min/{1.73_m2} (ref >59)
Glucose: 136 mg/dL — ABNORMAL HIGH (ref 65–99)
Potassium: 4.1 mmol/L (ref 3.5–5.2)
Sodium: 147 mmol/L — ABNORMAL HIGH (ref 134–144)

## 2014-02-07 ENCOUNTER — Other Ambulatory Visit: Payer: Self-pay

## 2014-02-07 DIAGNOSIS — Z1231 Encounter for screening mammogram for malignant neoplasm of breast: Secondary | ICD-10-CM

## 2014-02-22 ENCOUNTER — Encounter (INDEPENDENT_AMBULATORY_CARE_PROVIDER_SITE_OTHER): Payer: Self-pay

## 2014-02-22 ENCOUNTER — Ambulatory Visit
Admission: RE | Admit: 2014-02-22 | Discharge: 2014-02-22 | Disposition: A | Discharge status: Home / Self Care | Payer: Medicare Other | Source: Ambulatory Visit

## 2014-02-22 DIAGNOSIS — Z1231 Encounter for screening mammogram for malignant neoplasm of breast: Secondary | ICD-10-CM

## 2014-03-13 ENCOUNTER — Other Ambulatory Visit: Payer: Self-pay | Admitting: Internal Medicine

## 2014-03-15 ENCOUNTER — Ambulatory Visit (INDEPENDENT_AMBULATORY_CARE_PROVIDER_SITE_OTHER): Payer: Medicare Other | Admitting: Internal Medicine

## 2014-03-15 ENCOUNTER — Encounter: Payer: Self-pay | Admitting: Internal Medicine

## 2014-03-15 VITALS — BP 134/60 | HR 96 | Temp 98.2°F | Resp 20 | Ht 63.39 in | Wt 152.6 lb

## 2014-03-15 DIAGNOSIS — R739 Hyperglycemia, unspecified: Secondary | ICD-10-CM

## 2014-03-15 DIAGNOSIS — M810 Age-related osteoporosis without current pathological fracture: Secondary | ICD-10-CM

## 2014-03-15 DIAGNOSIS — H409 Unspecified glaucoma: Secondary | ICD-10-CM

## 2014-03-15 DIAGNOSIS — E876 Hypokalemia: Secondary | ICD-10-CM

## 2014-03-15 DIAGNOSIS — I10 Essential (primary) hypertension: Secondary | ICD-10-CM

## 2014-03-15 DIAGNOSIS — F209 Schizophrenia, unspecified: Secondary | ICD-10-CM

## 2014-03-15 DIAGNOSIS — R7309 Other abnormal glucose: Secondary | ICD-10-CM

## 2014-03-15 DIAGNOSIS — Z0000 Encounter for general adult medical examination without abnormal findings: Secondary | ICD-10-CM

## 2014-03-15 DIAGNOSIS — E785 Hyperlipidemia, unspecified: Secondary | ICD-10-CM

## 2014-03-15 DIAGNOSIS — K219 Gastro-esophageal reflux disease without esophagitis: Secondary | ICD-10-CM

## 2014-03-15 MED ORDER — ZOSTER VACCINE LIVE 19400 UNT/0.65ML ~~LOC~~ SOLR: 0.6500 mL | Freq: Once | SUBCUTANEOUS | Status: DC

## 2014-03-15 MED ORDER — PNEUMOCOCCAL 13-VAL CONJ VACC IM SUSP: 0.5000 mL | INTRAMUSCULAR | Status: DC

## 2014-03-15 NOTE — Progress Notes (Signed)
Patient ID: [name], female   DOB: 26-Mar-1951, 63 y.o.   MRN: [id number]    Chief Complaint  Patient presents with  . Annual Exam   No Known Allergies  HPI 63 y/o female patient is here for annual exam. She has history of HTN, GERD, primary hyperparathyroidism, osteoporosis, schizophrenia, OA, glaucoma. Last pap smear 2013 normal Never had colonoscopy before. Denies melena, rectal bleed or constipation No falls reported Mood has been good. Smoking persists. Does not want to quit Denies leg pain while walking. No skin color change Denies sores/ wounds in legs dexa scan 2013 osteoporosis- no record for review Her reflux symptom have resolved completely  Review of Systems  Constitutional: Negative for fever, chills, malaise/fatigue and diaphoresis.  HENT: Negative for congestion, hearing loss and sore throat.   Eyes: Negative for blurred vision, double vision and discharge.  Respiratory: Negative for cough, sputum production, shortness of breath and wheezing.   Cardiovascular: Negative for chest pain, palpitations, orthopnea and leg swelling.  Gastrointestinal: Negative for heartburn, nausea, vomiting, abdominal pain, diarrhea and constipation.  Genitourinary: Negative for dysuria, urgency, frequency and flank pain.  Musculoskeletal: Negative for back pain, falls, joint pain and myalgias.  Skin: Negative for itching and rash.  Neurological: Negative for dizziness, tingling, focal weakness and headaches.  Psychiatric/Behavioral: Negative for depression and memory loss. The patient is not nervous/anxious.    Past Medical History  Diagnosis Date  . Hyperparathyroidism   . Schizophrenia   . Hypertension   . Osteopenia   . Vitamin D deficiency   . Cough   . Other abnormal blood chemistry   . Chlamydia trachomatis infection of unspecified genitourinary site 12/18/2011  . Other and unspecified hyperlipidemia   . Disorder of bone and cartilage, unspecified   . Edema   .  Tobacco use disorder   . Tachycardia, unspecified 05/29/2010  . Unspecified glaucoma    History reviewed. No pertinent past surgical history. Family History  Problem Relation Age of Onset  . Hypertension Other   . Diabetes Other   . Cancer Mother   . Cancer Brother     Throat   History   Social History  . Marital Status: Married    Spouse Name: N/A    Number of Children: N/A  . Years of Education: N/A   Occupational History  . Not on file.   Social History Main Topics  . Smoking status: Former Smoker    Quit date: 09/22/2012  . Smokeless tobacco: Not on file     Comment: Quite age 63   . Alcohol Use: Yes  . Drug Use: No  . Sexual Activity: Not on file   Other Topics Concern  . Not on file   Social History Narrative  . No narrative on file   Current Outpatient Prescriptions on File Prior to Visit  Medication Sig Dispense Refill  . atenolol (TENORMIN) 50 MG tablet Take 1 tablet (50 mg total) by mouth daily.  30 tablet  5  . atorvastatin (LIPITOR) 10 MG tablet Take one tablet once a day at bedtime for cholesterol  30 tablet  5  . bimatoprost (LUMIGAN) 0.03 % ophthalmic solution 1 drop at bedtime. One drop in both eyes once a day for glaucoma      . brinzolamide (AZOPT) 1 % ophthalmic suspension Place 1 drop into both eyes 3 (three) times daily.        . cholecalciferol (VITAMIN D) 1000 UNITS tablet Take 2,000 Units by mouth daily.        .Marland Kitchen  enalapril (VASOTEC) 10 MG tablet TAKE 1 TABLET (10 MG TOTAL) BY MOUTH DAILY.  30 tablet  3  . Multiple Vitamins-Minerals (MULTIVITAMIN WITH MINERALS) tablet Take 1 tablet by mouth daily. Take 1/2 tablet daily.      Marland Kitchen. omeprazole (PRILOSEC) 20 MG capsule Take 1 capsule (20 mg total) by mouth daily.  15 capsule  0  . potassium chloride SA (K-DUR,KLOR-CON) 20 MEQ tablet Take 1 tablet (20 mEq total) by mouth 2 (two) times daily.  60 tablet  0  . thioridazine (MELLARIL) 50 MG tablet Take 50 mg by mouth. Take 2 tablets at bedtime.      Marland Kitchen.  alendronate (FOSAMAX) 70 MG tablet Take 70 mg by mouth every 7 (seven) days. Take 1 tablet once weekly to treat osteoporosis.Take with a full glass of water on an empty stomach.       No current facility-administered medications on file prior to visit.     Physical exam BP 134/60  Pulse 96  Temp(Src) 98.2 F (36.8 C) (Oral)  Resp 20  Ht 5' 3.39" (1.61 m)  Wt 152 lb 9.6 oz (69.219 kg)  BMI 26.70 kg/m2  SpO2 98%  Wt Readings from Last 3 Encounters:  03/15/14 152 lb 9.6 oz (69.219 kg)  12/20/13 154 lb (69.854 kg)  06/14/13 159 lb (72.122 kg)   Constitutional: She is oriented to person, place, and time. She appears well-developed and well-nourished. No distress. She is overweight HENT:   Head: Normocephalic and atraumatic.  Nose: Nose normal.   Mouth/Throat: No oropharyngeal exudate. Upper and lower dentures present Eyes: Conjunctivae and EOM are normal. Pupils are equal, round, and reactive to light. Right eye exhibits no discharge. Left eye exhibits no discharge. No scleral icterus. Wears glasses Neck: Normal range of motion. Neck supple. No JVD present. No tracheal deviation present. No thyromegaly present.No carotid bruits   Cardiovascular: Normal rate, regular rhythm, normal heart sounds and intact distal pulses.    No murmur heard. Pulmonary/Chest: Effort normal and breath sounds normal. No stridor. No respiratory distress. She has no wheezes. She has no rales.   Abdominal: Soft. Bowel sounds are normal. She exhibits no distension and no mass.   Musculoskeletal: Normal range of motion. She exhibits no edema and no tenderness.  Lymphadenopathy: She has no cervical adenopathy.  Neurological: She is alert and oriented to person, place, and time.   Skin: Skin is warm and dry. No rash noted. She is not diaphoretic. No erythema. No pallor.  Psychiatric: She has a normal mood and affect. Her behavior is normal. Judgment and thought content normal  imaging 03/15/14 MMSE 26/30, failed  clock draw 03/15/14 EKG normal sinus rhythm, T wave abnormality in high lateral leads  Labs- Lab Results  Component Value Date   WBC 7.1 12/20/2013   HGB 12.8 12/20/2013   HCT 37.2 12/20/2013   MCV 91 12/20/2013   CMP     Component Value Date/Time   NA 147* 01/03/2014 0855   NA 143 07/29/2012 1816   K 4.1 01/03/2014 0855   CL 108 01/03/2014 0855   CO2 25 01/03/2014 0855   GLUCOSE 136* 01/03/2014 0855   GLUCOSE 106* 07/29/2012 1816   BUN 6* 01/03/2014 0855   BUN 6 07/29/2012 1816   CREATININE 0.68 01/03/2014 0855   CALCIUM 11.3* 01/03/2014 0855   PROT 6.1 12/20/2013 1537   AST 55* 12/20/2013 1537   ALT 52* 12/20/2013 1537   ALKPHOS 71 12/20/2013 1537   BILITOT 0.8 12/20/2013 1537  GFRNONAA 95 01/03/2014 0855   GFRAA 109 01/03/2014 0855   Lipid Panel     Component Value Date/Time   TRIG 165* 12/20/2013 1537   HDL 63 12/20/2013 1537   CHOLHDL 2.0 12/20/2013 1537   LDLCALC 32 12/20/2013 1537    Immunization History  Administered Date(s) Administered  . Influenza,inj,Quad PF,36+ Mos 06/14/2013  . Td 09/23/1995  . Tdap 12/18/2011   Assessment/plan  1. Unspecified essential hypertension Stable readings. continue enalapril 10 mg daily with atenolol 50 mg daily  2. Gastroesophageal reflux disease, esophagitis presence not specified Resolved. D/c omerpazole for now with her hx of osteoporosis and absence of symptom  3. Osteoporosis, unspecified Continue weekly alendronate and vit d supplement  4. Glaucoma Stable, continue her lumigan and azopt  5. Hyperlipidemia Continue lipitor 10 mg daily. Monitor clinically  6. Schizophrenia, unspecified type Stable. Continue thioridazine 50 mg daily  7. Routine general medical examination at a health care facility the patient was counseled regarding the appropriate use of alcohol, regular self-examination of the breasts on a monthly basis, prevention of dental and periodontal disease, diet, regular sustained exercise for at least 30 minutes 5  times per week, routine screening interval for mammogram as recommended by the American Cancer Society and ACOG, the proper use of sunscreen and protective clothing, tobacco use, and recommended schedule for GI hemoccult testing, colonoscopy, cholesterol, thyroid and diabetes screening. Pneumococcal vaccine prevnar 13 provided today. Script for zostavax sent to pharmacy. Negative depression screening. FOBT card provided for screening for colon cancer. Last pap 2013 normal  8. Hyperglycemia Check a1c with her hyperglycemia and overweight with history fo HTN - Hemoglobin A1c - Basic Metabolic Panel  9. Hypokalemia Off kcl supplement. Check bmp - Basic Metabolic Panel

## 2014-03-15 NOTE — Progress Notes (Signed)
Clock not passed

## 2014-03-16 LAB — BASIC METABOLIC PANEL
BUN/Creatinine Ratio: 7 — ABNORMAL LOW (ref 11–26)
BUN: 7 mg/dL — ABNORMAL LOW (ref 8–27)
CALCIUM: 11.7 mg/dL — AB (ref 8.7–10.3)
CO2: 21 mmol/L (ref 18–29)
Chloride: 106 mmol/L (ref 97–108)
Creatinine, Ser: 0.96 mg/dL (ref 0.57–1.00)
GFR, EST AFRICAN AMERICAN: 74 mL/min/{1.73_m2} (ref >59)
GFR, EST NON AFRICAN AMERICAN: 64 mL/min/{1.73_m2} (ref >59)
Glucose: 113 mg/dL — ABNORMAL HIGH (ref 65–99)
POTASSIUM: 3.7 mmol/L (ref 3.5–5.2)
SODIUM: 144 mmol/L (ref 134–144)

## 2014-03-16 LAB — HEMOGLOBIN A1C
ESTIMATED AVERAGE GLUCOSE: 117 mg/dL
Hgb A1c MFr Bld: 5.7 % — ABNORMAL HIGH (ref 4.8–5.6)

## 2014-03-17 ENCOUNTER — Telehealth: Payer: Self-pay | Admitting: *Deleted

## 2014-03-17 NOTE — Telephone Encounter (Signed)
Spoke with patient regarding lab results and ask patient about her calcium supplement intake, she stated that she is taking a multi vitamin with D3 (1000 mg) daily. I informed her to stop taking the multi vitamin until further notice, and that we needed to recheck her calcium level. She stated that she would call me back on Monday to schedule appointment for labs. She also stated that she will cut back on sweets, fried foods, and try to exercise on a regular basis.

## 2014-03-17 NOTE — Telephone Encounter (Signed)
Message copied by Lamont SnowballICE, SHARON L on Fri Mar 17, 2014  4:37 PM ------      Message from: Oneal GroutPANDEY, MAHIMA      Created: Fri Mar 17, 2014 12:23 PM       Your calcium level is high. Are you on any calcium supplement? If yes, let's stop it. Also we need repeat lab for total calcium, ionized calcium and PTH level to assess further why your calcium level is high.  Please schedule this and notify patient. Your blood test is suggestive of prediabetes- no diabetes at present but we need to keep a check on your sugar level. Cut down on sweets, fried food and do exercise on regular basis for now ------

## 2014-03-20 ENCOUNTER — Other Ambulatory Visit: Payer: Self-pay | Admitting: *Deleted

## 2014-03-20 ENCOUNTER — Other Ambulatory Visit: Payer: Medicare Other

## 2014-03-21 LAB — PTH, INTACT AND CALCIUM
Calcium: 11.5 mg/dL — ABNORMAL HIGH (ref 8.7–10.3)
PTH: 95 pg/mL — ABNORMAL HIGH (ref 15–65)

## 2014-03-23 ENCOUNTER — Telehealth: Payer: Self-pay | Admitting: *Deleted

## 2014-03-23 NOTE — Telephone Encounter (Signed)
Message copied by Lamont SnowballICE, Khalila Buechner L on Thu Mar 23, 2014 11:21 AM ------      Message from: Oneal GroutPANDEY, MAHIMA      Created: Fri Mar 17, 2014 12:23 PM       Your calcium level is high. Are you on any calcium supplement? If yes, let's stop it. Also we need repeat lab for total calcium, ionized calcium and PTH level to assess further why your calcium level is high.  Please schedule this and notify patient. Your blood test is suggestive of prediabetes- no diabetes at present but we need to keep a check on your sugar level. Cut down on sweets, fried food and do exercise on regular basis for now ------

## 2014-03-23 NOTE — Telephone Encounter (Signed)
Spoke with patient regarding the repeated total calcium, done on Tuesday waiting for results.

## 2014-03-30 ENCOUNTER — Encounter: Payer: Self-pay | Admitting: Internal Medicine

## 2014-04-12 ENCOUNTER — Encounter: Payer: Self-pay | Admitting: Internal Medicine

## 2014-04-12 ENCOUNTER — Ambulatory Visit (INDEPENDENT_AMBULATORY_CARE_PROVIDER_SITE_OTHER): Payer: Medicare Other | Admitting: Internal Medicine

## 2014-04-12 VITALS — BP 146/68 | HR 87 | Temp 98.0°F | Wt 151.4 lb

## 2014-04-12 DIAGNOSIS — E210 Primary hyperparathyroidism: Secondary | ICD-10-CM | POA: Insufficient documentation

## 2014-04-12 DIAGNOSIS — Z23 Encounter for immunization: Secondary | ICD-10-CM

## 2014-04-12 MED ORDER — ZOSTER VACCINE LIVE 19400 UNT/0.65ML ~~LOC~~ SOLR: 0.6500 mL | Freq: Once | SUBCUTANEOUS | Status: DC

## 2014-04-12 NOTE — Progress Notes (Signed)
Patient ID: [name], female   DOB: May 29, 1951, 63 y.o.   MRN: [id number]    Chief Complaint  Patient presents with  . Follow-up    f/u on labs on 03/20/14   No Known Allergies  HPI Pt here to review her lab result and discuss management plan her recent lab had shown elevated calcium level. PTH was ordered and is elevated as well Denies any history of kidney disease No hx of kidney stones Denies headache or confusion No bone pain No falls Has hx of HTN and Osteoporosis on fosamax  ROS See hpi  Past Medical History  Diagnosis Date  . Hyperparathyroidism   . Schizophrenia   . Hypertension   . Osteopenia   . Vitamin D deficiency   . Cough   . Other abnormal blood chemistry   . Chlamydia trachomatis infection of unspecified genitourinary site 12/18/2011  . Other and unspecified hyperlipidemia   . Disorder of bone and cartilage, unspecified   . Edema   . Tobacco use disorder   . Tachycardia, unspecified 05/29/2010  . Unspecified glaucoma    Current Outpatient Prescriptions on File Prior to Visit  Medication Sig Dispense Refill  . alendronate (FOSAMAX) 70 MG tablet Take 70 mg by mouth every 7 (seven) days. Take 1 tablet once weekly to treat osteoporosis.Take with a full glass of water on an empty stomach.      Marland Kitchen atenolol (TENORMIN) 50 MG tablet Take 1 tablet (50 mg total) by mouth daily.  30 tablet  5  . atorvastatin (LIPITOR) 10 MG tablet Take one tablet once a day at bedtime for cholesterol  30 tablet  5  . bimatoprost (LUMIGAN) 0.03 % ophthalmic solution 1 drop at bedtime. One drop in both eyes once a day for glaucoma      . brinzolamide (AZOPT) 1 % ophthalmic suspension Place 1 drop into both eyes 3 (three) times daily.        . enalapril (VASOTEC) 10 MG tablet TAKE 1 TABLET (10 MG TOTAL) BY MOUTH DAILY.  30 tablet  3  . thioridazine (MELLARIL) 50 MG tablet Take 50 mg by mouth. Take 2 tablets at bedtime.       No current facility-administered medications on file  prior to visit.    Physical exam BP 146/68  Pulse 87  Temp(Src) 98 F (36.7 C) (Oral)  Wt 151 lb 6.4 oz (68.675 kg)  SpO2 99%  gen- elderly female in NAD HEENT- no pallor, no icterus Neck- no lymphadenopathy, no thyromegaly, no carotid bruit cvs- normal s1, s2, rrr respi- CTAB abdo- bowel sound present, soft , non tender Ext- able to move all 4 Neuro- aaox 3, no focal deficit  Lab Results  Component Value Date   CALCIUM 11.5* 03/20/2014   Lab Results  Component Value Date   CREATININE 0.96 03/15/2014   BUN 7* 03/15/2014   NA 144 03/15/2014   K 3.7 03/15/2014   CL 106 03/15/2014   CO2 21 03/15/2014   Assessment/plan  1. Primary hyperparathyroidism She is asymptomatic at present. Will get dexa scan to assess her bone density given her osteoporosis history. Reviewed PTH scan from 2012 negative for adenoma. Also has HTN. Has normal GFR and no personal hx of kidney stones. Given her history of low BMD and calcium 1 point above the upper limit of normal, despite of being asymptomatic, she meets criteria for benefitting from parathyroidectomy. I will set her up for dexa scan and after reviewing result will likely refer  her to endocrinology for further evaluation for surgery  2. Need for prophylactic vaccination and inoculation against other viral diseases(V04.89) - zoster vaccine live, PF, (ZOSTAVAX) [id number] UNT/0.65ML injection; Inject 19,400 Units into the skin once.  Dispense: 1 each; Refill: 0

## 2014-04-27 ENCOUNTER — Ambulatory Visit
Admission: RE | Admit: 2014-04-27 | Discharge: 2014-04-27 | Disposition: A | Discharge status: Home / Self Care | Payer: Medicare Other | Source: Ambulatory Visit | Attending: Internal Medicine | Admitting: Internal Medicine

## 2014-04-27 DIAGNOSIS — E210 Primary hyperparathyroidism: Secondary | ICD-10-CM

## 2014-05-07 ENCOUNTER — Other Ambulatory Visit: Payer: Self-pay | Admitting: Nurse Practitioner

## 2014-06-03 ENCOUNTER — Other Ambulatory Visit: Payer: Self-pay | Admitting: Internal Medicine

## 2014-07-18 ENCOUNTER — Encounter: Payer: Self-pay | Admitting: Internal Medicine

## 2014-07-18 ENCOUNTER — Ambulatory Visit (INDEPENDENT_AMBULATORY_CARE_PROVIDER_SITE_OTHER): Payer: Medicare Other | Admitting: Internal Medicine

## 2014-07-18 ENCOUNTER — Other Ambulatory Visit: Payer: Self-pay | Admitting: Internal Medicine

## 2014-07-18 ENCOUNTER — Other Ambulatory Visit: Payer: Medicare Other

## 2014-07-18 ENCOUNTER — Ambulatory Visit: Payer: Medicare Other | Admitting: Internal Medicine

## 2014-07-18 VITALS — BP 130/80 | HR 69 | Temp 98.3°F | Ht 63.0 in | Wt 153.6 lb

## 2014-07-18 DIAGNOSIS — R7309 Other abnormal glucose: Secondary | ICD-10-CM

## 2014-07-18 DIAGNOSIS — Z1211 Encounter for screening for malignant neoplasm of colon: Secondary | ICD-10-CM

## 2014-07-18 DIAGNOSIS — Z23 Encounter for immunization: Secondary | ICD-10-CM | POA: Insufficient documentation

## 2014-07-18 DIAGNOSIS — R7303 Prediabetes: Secondary | ICD-10-CM | POA: Insufficient documentation

## 2014-07-18 DIAGNOSIS — M810 Age-related osteoporosis without current pathological fracture: Secondary | ICD-10-CM

## 2014-07-18 DIAGNOSIS — I10 Essential (primary) hypertension: Secondary | ICD-10-CM | POA: Insufficient documentation

## 2014-07-18 DIAGNOSIS — E785 Hyperlipidemia, unspecified: Secondary | ICD-10-CM

## 2014-07-18 MED ORDER — ALENDRONATE SODIUM 70 MG PO TABS: 70.0000 mg | ORAL_TABLET | ORAL | Status: DC

## 2014-07-18 MED ORDER — ALENDRONATE SODIUM 70 MG PO TABS: ORAL_TABLET | ORAL | Status: DC

## 2014-07-18 NOTE — Progress Notes (Signed)
Patient ID: [name], female   DOB: 11-28-1950, 63 y.o.   MRN: [id number]    Chief Complaint  Patient presents with  . Medical Management of Chronic Issues    4 month follow up   No Known Allergies  HPI 63 y/o female pt is here for RV. Has ran out of fosamax and needs refill Compliant with her other meds bp well controlled this visit No concerns this visit.  No Known Allergies  HPI 63 y/o female patient is here for annual exam. She has history of HTN, GERD, primary hyperparathyroidism, osteoporosis, schizophrenia, OA, glaucoma. Last pap smear 2013 normal Never had colonoscopy before. Denies melena, rectal bleed or constipation No falls reported Mood has been good. Smoking persists. Does not want to quit Denies leg pain while walking. No skin color change Denies sores/ wounds in legs dexa scan 2013 osteoporosis- no record for review Her reflux symptom have resolved completely  Review of Systems  Constitutional: Negative for fever, chills, malaise/fatigue and diaphoresis.  HENT: Negative for congestion, hearing loss and sore throat.   Respiratory: Negative for cough, sputum production, shortness of breath and wheezing.   Cardiovascular: Negative for chest pain, palpitations, orthopnea and leg swelling.  Gastrointestinal: Negative for heartburn, nausea, vomiting, abdominal pain, diarrhea and constipation.  Musculoskeletal: Negative for back pain, falls, joint pain and myalgias.  Skin: Negative for itching and rash.  Neurological: Negative for dizziness, tingling, focal weakness and headaches.  Psychiatric/Behavioral: Negative for depression and memory loss. The patient is not nervous/anxious.      Past Medical History  Diagnosis Date  . Hyperparathyroidism   . Schizophrenia   . Hypertension   . Osteopenia   . Vitamin D deficiency   . Cough   . Other abnormal blood chemistry   . Chlamydia trachomatis infection of unspecified genitourinary site 12/18/2011  .  Other and unspecified hyperlipidemia   . Disorder of bone and cartilage, unspecified   . Edema   . Tobacco use disorder   . Tachycardia, unspecified 05/29/2010  . Unspecified glaucoma    Medication reviewed. [facility]   Physical exam BP 130/80 mmHg  Pulse 69  Temp(Src) 98.3 F (36.8 C) (Oral)  Ht 5\' 3"  (1.6 m)  Wt 153 lb 9.6 oz (69.673 kg)  BMI 27.22 kg/m2  SpO2 99%   Wt Readings from Last 3 Encounters:  07/18/14 153 lb 9.6 oz (69.673 kg)  04/12/14 151 lb 6.4 oz (68.675 kg)  03/15/14 152 lb 9.6 oz (69.219 kg)   Constitutional: She is oriented to person, place, and time. She appears well-developed and well-nourished. No distress. She is overweight HENT:   Head: Normocephalic and atraumatic.  Neck: Normal range of motion. Neck supple.  Cardiovascular: Normal rate, regular rhythm, normal heart sounds and intact distal pulses.    No murmur heard. Pulmonary/Chest: Effort normal and breath sounds normal. No stridor. No respiratory distress. She has no wheezes. She has no rales.   Abdominal: Soft. Bowel sounds are normal. She exhibits no distension and no mass.   Musculoskeletal: Normal range of motion. She exhibits no edema and no tenderness.  Lymphadenopathy: She has no cervical adenopathy.  Neurological: She is alert and oriented to person, place, and time.   Skin: Skin is warm and dry.  Labs   Lipid Panel     Component Value Date/Time   TRIG 165* 12/20/2013 1537   HDL 63 12/20/2013 1537   CHOLHDL 2.0 12/20/2013 1537   LDLCALC 32 12/20/2013 1537   Lab Results  Component Value Date   CREATININE 0.96 03/15/2014   imaging 03/15/14 MMSE 26/30, failed clock draw 03/15/14 EKG normal sinus rhythm, T wave abnormality in high lateral leads   Assessment/plan  1. Encounter for immunization vaccinantion provided  2. Hyperlipidemia Continue lipitor. Diet restriction - CMP - Lipid Panel - CBC (no diff)  3. Osteoporosis Fall precautions. Continue weekly alendronate and vit d  supplement  4. Essential hypertension Stable readings. continue enalapril 10 mg daily with atenolol 50 mg daily - CMP  5. Prediabetes Check a1c to rule out dm. Dietary restriction and daily exercise advised Body mass index is 27.22 kg/(m^2). - Hemoglobin A1c

## 2014-07-19 LAB — COMPREHENSIVE METABOLIC PANEL
ALBUMIN: 4.4 g/dL (ref 3.6–4.8)
ALK PHOS: 74 IU/L (ref 39–117)
ALT: 16 IU/L (ref 0–32)
AST: 21 IU/L (ref 0–40)
Albumin/Globulin Ratio: 2 (ref 1.1–2.5)
BUN/Creatinine Ratio: 12 (ref 11–26)
BUN: 10 mg/dL (ref 8–27)
CHLORIDE: 104 mmol/L (ref 97–108)
CO2: 24 mmol/L (ref 18–29)
CREATININE: 0.86 mg/dL (ref 0.57–1.00)
Calcium: 11.3 mg/dL — ABNORMAL HIGH (ref 8.7–10.3)
GFR calc Af Amer: 84 mL/min/{1.73_m2} (ref >59)
GFR calc non Af Amer: 73 mL/min/{1.73_m2} (ref >59)
GLOBULIN, TOTAL: 2.2 g/dL (ref 1.5–4.5)
GLUCOSE: 113 mg/dL — AB (ref 65–99)
Potassium: 3 mmol/L — ABNORMAL LOW (ref 3.5–5.2)
Sodium: 144 mmol/L (ref 134–144)
TOTAL PROTEIN: 6.6 g/dL (ref 6.0–8.5)
Total Bilirubin: 0.4 mg/dL (ref 0.0–1.2)

## 2014-07-19 LAB — LIPID PANEL
CHOL/HDL RATIO: 3.5 ratio (ref 0.0–4.4)
CHOLESTEROL TOTAL: 145 mg/dL (ref 100–199)
HDL: 41 mg/dL (ref >39)
LDL Calculated: 61 mg/dL (ref 0–99)
TRIGLYCERIDES: 216 mg/dL — AB (ref 0–149)
VLDL CHOLESTEROL CAL: 43 mg/dL — AB (ref 5–40)

## 2014-07-19 LAB — CBC
HCT: 40.3 % (ref 34.0–46.6)
Hemoglobin: 13.1 g/dL (ref 11.1–15.9)
MCH: 30 pg (ref 26.6–33.0)
MCHC: 32.5 g/dL (ref 31.5–35.7)
MCV: 92 fL (ref 79–97)
PLATELETS: 152 10*3/uL (ref 150–379)
RBC: 4.36 x10E6/uL (ref 3.77–5.28)
RDW: 13.2 % (ref 12.3–15.4)
WBC: 7.7 10*3/uL (ref 3.4–10.8)

## 2014-07-19 LAB — HEMOGLOBIN A1C
ESTIMATED AVERAGE GLUCOSE: 117 mg/dL
HEMOGLOBIN A1C: 5.7 % — AB (ref 4.8–5.6)

## 2014-07-22 ENCOUNTER — Other Ambulatory Visit: Payer: Self-pay | Admitting: Internal Medicine

## 2014-07-25 ENCOUNTER — Telehealth: Payer: Self-pay | Admitting: *Deleted

## 2014-07-25 ENCOUNTER — Other Ambulatory Visit: Payer: Self-pay | Admitting: *Deleted

## 2014-07-25 DIAGNOSIS — E0590 Thyrotoxicosis, unspecified without thyrotoxic crisis or storm: Secondary | ICD-10-CM

## 2014-07-25 MED ORDER — ATORVASTATIN CALCIUM 20 MG PO TABS: 20.0000 mg | ORAL_TABLET | Freq: Every day | ORAL | Status: DC

## 2014-07-25 MED ORDER — POTASSIUM CHLORIDE CRYS ER 20 MEQ PO TBCR: 20.0000 meq | EXTENDED_RELEASE_TABLET | Freq: Two times a day (BID) | ORAL | Status: AC

## 2014-07-25 NOTE — Telephone Encounter (Signed)
-----   Message from Oneal GroutMahima Pandey, MD sent at 07/21/2014  3:54 PM EDT ----- Low potassium level. Call in for potassium chloride 20 meq twice a day for 5 days and then recheck bmp. Worsened cholesterol level. Increase lipitor to 20 mg daily.  Persistent high calcium level. Please provide endocrine referral for primary hyperparathyroidism.

## 2014-07-25 NOTE — Telephone Encounter (Signed)
Spoke with patient regarding lab results she states that she thought that she was doing well and would not have to come back for 6 mos. I inform her that her potassium was a little low and she was given some medication to bring it back up, then we will recheck it. I informed her that all medication have been sent to the pharmacy and she should hear from them soon.

## 2014-11-08 DIAGNOSIS — H4011X2 Primary open-angle glaucoma, moderate stage: Secondary | ICD-10-CM | POA: Diagnosis not present

## 2014-11-14 ENCOUNTER — Encounter: Payer: Self-pay | Admitting: Internal Medicine

## 2014-11-17 ENCOUNTER — Other Ambulatory Visit: Payer: Self-pay | Admitting: Internal Medicine

## 2014-12-27 DIAGNOSIS — E559 Vitamin D deficiency, unspecified: Secondary | ICD-10-CM | POA: Diagnosis not present

## 2014-12-27 DIAGNOSIS — M899 Disorder of bone, unspecified: Secondary | ICD-10-CM | POA: Diagnosis not present

## 2014-12-27 DIAGNOSIS — E210 Primary hyperparathyroidism: Secondary | ICD-10-CM | POA: Diagnosis not present

## 2014-12-27 DIAGNOSIS — I10 Essential (primary) hypertension: Secondary | ICD-10-CM | POA: Diagnosis not present

## 2014-12-29 ENCOUNTER — Other Ambulatory Visit (HOSPITAL_COMMUNITY): Payer: Self-pay | Admitting: Endocrinology

## 2014-12-29 DIAGNOSIS — E213 Hyperparathyroidism, unspecified: Secondary | ICD-10-CM

## 2015-01-02 ENCOUNTER — Encounter (HOSPITAL_COMMUNITY)
Admission: RE | Admit: 2015-01-02 | Discharge: 2015-01-02 | Disposition: A | Discharge status: Home / Self Care | Payer: Medicare Other | Source: Ambulatory Visit | Attending: Endocrinology | Admitting: Endocrinology

## 2015-01-02 ENCOUNTER — Encounter (HOSPITAL_COMMUNITY): Payer: Medicare Other

## 2015-01-02 DIAGNOSIS — M8580 Other specified disorders of bone density and structure, unspecified site: Secondary | ICD-10-CM | POA: Diagnosis not present

## 2015-01-02 DIAGNOSIS — I10 Essential (primary) hypertension: Secondary | ICD-10-CM | POA: Insufficient documentation

## 2015-01-02 DIAGNOSIS — E559 Vitamin D deficiency, unspecified: Secondary | ICD-10-CM | POA: Diagnosis not present

## 2015-01-02 DIAGNOSIS — E785 Hyperlipidemia, unspecified: Secondary | ICD-10-CM | POA: Insufficient documentation

## 2015-01-02 DIAGNOSIS — E213 Hyperparathyroidism, unspecified: Secondary | ICD-10-CM | POA: Diagnosis not present

## 2015-01-02 MED ORDER — TECHNETIUM TC 99M SESTAMIBI GENERIC - CARDIOLITE
25.0000 | Freq: Once | INTRAVENOUS | Status: AC | PRN
  Administered 2015-01-02: 25 via INTRAVENOUS

## 2015-01-10 DIAGNOSIS — E210 Primary hyperparathyroidism: Secondary | ICD-10-CM | POA: Diagnosis not present

## 2015-01-16 ENCOUNTER — Encounter: Payer: Medicare Other | Admitting: Internal Medicine

## 2015-01-24 ENCOUNTER — Other Ambulatory Visit (HOSPITAL_COMMUNITY): Payer: Self-pay | Admitting: Endocrinology

## 2015-01-24 ENCOUNTER — Other Ambulatory Visit: Payer: Self-pay | Admitting: Internal Medicine

## 2015-01-24 ENCOUNTER — Encounter (HOSPITAL_COMMUNITY): Payer: Self-pay

## 2015-01-24 ENCOUNTER — Encounter (HOSPITAL_COMMUNITY)
Admission: RE | Admit: 2015-01-24 | Discharge: 2015-01-24 | Disposition: A | Discharge status: Home / Self Care | Payer: Medicare Other | Source: Ambulatory Visit | Attending: Endocrinology | Admitting: Endocrinology

## 2015-01-24 MED ORDER — SODIUM CHLORIDE 0.9 % IV SOLN
Freq: Once | INTRAVENOUS | Status: AC
  Administered 2015-01-24: 10:00:00 via INTRAVENOUS

## 2015-01-24 MED ORDER — ZOLEDRONIC ACID 4 MG/5ML IV CONC
4.0000 mg | Freq: Once | INTRAVENOUS | Status: AC
  Administered 2015-01-24: 4 mg via INTRAVENOUS
  Filled 2015-01-24: qty 5

## 2015-01-24 NOTE — Discharge Instructions (Signed)

## 2015-01-25 ENCOUNTER — Encounter: Payer: Self-pay | Admitting: Nurse Practitioner

## 2015-01-25 ENCOUNTER — Ambulatory Visit (INDEPENDENT_AMBULATORY_CARE_PROVIDER_SITE_OTHER): Payer: Medicare Other | Admitting: Nurse Practitioner

## 2015-01-25 DIAGNOSIS — I10 Essential (primary) hypertension: Secondary | ICD-10-CM

## 2015-01-25 DIAGNOSIS — E785 Hyperlipidemia, unspecified: Secondary | ICD-10-CM

## 2015-01-25 DIAGNOSIS — F209 Schizophrenia, unspecified: Secondary | ICD-10-CM | POA: Diagnosis not present

## 2015-01-25 DIAGNOSIS — R7309 Other abnormal glucose: Secondary | ICD-10-CM

## 2015-01-25 DIAGNOSIS — E210 Primary hyperparathyroidism: Secondary | ICD-10-CM | POA: Diagnosis not present

## 2015-01-25 DIAGNOSIS — H409 Unspecified glaucoma: Secondary | ICD-10-CM

## 2015-01-25 DIAGNOSIS — R7303 Prediabetes: Secondary | ICD-10-CM

## 2015-01-25 MED ORDER — ZOSTER VACCINE LIVE 19400 UNT/0.65ML ~~LOC~~ SOLR: 0.6500 mL | Freq: Once | SUBCUTANEOUS | Status: DC

## 2015-01-25 NOTE — Progress Notes (Signed)
Patient ID: [name], female   DOB: 1951-08-28, 64 y.o.   MRN: [id number]    PCP: Sharon SellerEUBANKS, Abbigaile Rockman K, NP  No Known Allergies  Chief Complaint  Patient presents with  . Medical Management of Chronic Issues     HPI: Patient is a 64 y.o. female seen in the office today for routine follow up, scheduled for annual exam but physical is not due at this time.   Went to endocrine due to persistent high calcium, seeing Dr Talmage NapBalan   Still following with mental health, needs appt for refills  Willing to do cologuard-- has not had screening colonoscopy   Doing well, does not have any complaints or concerns at this time.   Advanced Directive information Does patient have an advance directive?: No, Would patient like information on creating an advanced directive?: Yes - Educational materials given Review of Systems:  Review of Systems  Constitutional: Negative for fever, chills, appetite change and unexpected weight change.  HENT: Positive for dental problem (dental appt this month). Negative for ear pain, hearing loss and mouth sores.   Eyes: Negative for visual disturbance.       Using her eye drops for glaucoma-- appt this month with eye doctor  Respiratory: Negative for cough, chest tightness and shortness of breath.   Cardiovascular: Negative for chest pain, palpitations and leg swelling.  Gastrointestinal: Positive for constipation (occasionally). Negative for nausea, vomiting, abdominal pain and diarrhea.  Endocrine: Negative for polydipsia, polyphagia and polyuria.  Genitourinary: Negative for difficulty urinating.  Musculoskeletal: Negative for back pain, joint swelling and arthralgias.  Skin: Negative for pallor and rash.  Neurological: Negative for dizziness, tremors, light-headedness and headaches.  Hematological: Negative for adenopathy.  Psychiatric/Behavioral: Negative for suicidal ideas, behavioral problems, sleep disturbance and agitation.    Past Medical History    Diagnosis Date  . Hyperparathyroidism   . Schizophrenia   . Hypertension   . Osteopenia   . Vitamin D deficiency   . Cough   . Other abnormal blood chemistry   . Chlamydia trachomatis infection of unspecified genitourinary site 12/18/2011  . Other and unspecified hyperlipidemia   . Disorder of bone and cartilage, unspecified   . Edema   . Tobacco use disorder   . Tachycardia, unspecified 05/29/2010  . Unspecified glaucoma    History reviewed. No pertinent past surgical history. Social History:   reports that she quit smoking about 2 years ago. She does not have any smokeless tobacco history on file. She reports that she drinks alcohol. She reports that she does not use illicit drugs.  Family History  Problem Relation Age of Onset  . Hypertension Other   . Diabetes Other   . Cancer Mother   . Cancer Brother     Throat    Medications: Patient's Medications  New Prescriptions   No medications on file  Previous Medications   ALENDRONATE (FOSAMAX) 70 MG TABLET    Take 1 tablet once weekly to treat osteoporosis.Take with a full glass of water on an empty stomach.   ATENOLOL (TENORMIN) 50 MG TABLET    TAKE 1 TABLET BY MOUTH EVERY DAY   ATORVASTATIN (LIPITOR) 20 MG TABLET    Take 1 tablet (20 mg total) by mouth daily.   BIMATOPROST (LUMIGAN) 0.03 % OPHTHALMIC SOLUTION    1 drop at bedtime. One drop in both eyes once a day for glaucoma   BRINZOLAMIDE (AZOPT) 1 % OPHTHALMIC SUSPENSION    Place 1 drop into both eyes 3 (three)  times daily.     ENALAPRIL (VASOTEC) 10 MG TABLET    TAKE 1 TABLET BY MOUTH DAILY   THIORIDAZINE (MELLARIL) 50 MG TABLET    Take 50 mg by mouth. Take 2 tablets at bedtime.  Modified Medications   No medications on file  Discontinued Medications   ZOSTER VACCINE LIVE, PF, (ZOSTAVAX) [id number] UNT/0.65ML INJECTION    Inject 19,400 Units into the skin once.     Physical Exam:  Filed Vitals:   01/25/15 1127  BP: 142/78  Pulse: 100  Temp: 98.1 F (36.7 C)   TempSrc: Oral  Resp: 16  Height: 5' 3.5" (1.613 m)  Weight: 150 lb (68.04 kg)  SpO2: 99%    Physical Exam  Constitutional: She is oriented to person, place, and time. She appears well-developed and well-nourished. No distress.  HENT:  Head: Normocephalic and atraumatic.  Mouth/Throat: Oropharynx is clear and moist. No oropharyngeal exudate.  Eyes: Conjunctivae are normal. Pupils are equal, round, and reactive to light.  Neck: Normal range of motion. Neck supple.  Cardiovascular: Normal rate, regular rhythm and normal heart sounds.   Pulmonary/Chest: Effort normal and breath sounds normal.  Abdominal: Soft. Bowel sounds are normal.  Musculoskeletal: She exhibits no edema or tenderness.  Neurological: She is alert and oriented to person, place, and time.  Skin: Skin is warm and dry. She is not diaphoretic.  Psychiatric: She has a normal mood and affect.    Labs reviewed: Basic Metabolic Panel:  Recent Labs  44/09/204/24/15 1525 03/20/14 1326 07/18/14 1253  NA 144  --  144  K 3.7  --  3.0*  CL 106  --  104  CO2 21  --  24  GLUCOSE 113*  --  113*  BUN 7*  --  10  CREATININE 0.96  --  0.86  CALCIUM 11.7* 11.5* 11.3*   Liver Function Tests:  Recent Labs  07/18/14 1253  AST 21  ALT 16  ALKPHOS 74  BILITOT 0.4  PROT 6.6   No results for input(s): LIPASE, AMYLASE in the last 8760 hours. No results for input(s): AMMONIA in the last 8760 hours. CBC:  Recent Labs  07/18/14 1253  WBC 7.7  HGB 13.1  HCT 40.3  MCV 92  PLT 152   Lipid Panel:  Recent Labs  07/18/14 1253  CHOL 145  HDL 41  LDLCALC 61  TRIG 216*  CHOLHDL 3.5   TSH: No results for input(s): TSH in the last 8760 hours. A1C: Lab Results  Component Value Date   HGBA1C 5.7* 07/18/2014     Assessment/Plan  1. Essential hypertension -elevated today, reports compliance with medications -discussed lifestyle modifications   - Comprehensive metabolic panel - CBC with Differential/Platelet;  Future - Comprehensive metabolic panel; Future -will follow up blood pressure in 2 weeks  2. Primary hyperparathyroidism -do not see records from endocrine, will request these records  3. Prediabetes -lifestyle modifications  - Hemoglobin A1c; Future  4. Schizophrenia, unspecified type -following with mental health, needs appts for refill and follow up, conts on mellaril, denies changes in mood  5. Glaucoma -cont on eye drops, has follow up this month  6. Hyperlipidemia -conts on lipitor  - Lipid panel; Future  2 weeks for BP check and Follow up in 5 months with fasting blood work prior to visit

## 2015-01-25 NOTE — Patient Instructions (Signed)
Please sign record release from endocrine -- we need records from Dr Willeen CassBalan's office.  Follow up in 5 months with fasting blood work proper to visit for physical

## 2015-01-26 ENCOUNTER — Other Ambulatory Visit: Payer: Self-pay | Admitting: Nurse Practitioner

## 2015-01-26 LAB — COMPREHENSIVE METABOLIC PANEL
A/G RATIO: 2.3 (ref 1.1–2.5)
ALBUMIN: 4.5 g/dL (ref 3.6–4.8)
ALT: 45 IU/L — ABNORMAL HIGH (ref 0–32)
AST: 40 IU/L (ref 0–40)
Alkaline Phosphatase: 64 IU/L (ref 39–117)
BUN/Creatinine Ratio: 11 (ref 11–26)
BUN: 8 mg/dL (ref 8–27)
Bilirubin Total: 0.5 mg/dL (ref 0.0–1.2)
CALCIUM: 11.5 mg/dL — AB (ref 8.7–10.3)
CO2: 26 mmol/L (ref 18–29)
CREATININE: 0.7 mg/dL (ref 0.57–1.00)
Chloride: 104 mmol/L (ref 97–108)
GFR calc Af Amer: 107 mL/min/{1.73_m2} (ref >59)
GFR, EST NON AFRICAN AMERICAN: 93 mL/min/{1.73_m2} (ref >59)
GLOBULIN, TOTAL: 2 g/dL (ref 1.5–4.5)
GLUCOSE: 123 mg/dL — AB (ref 65–99)
Potassium: 2.9 mmol/L — ABNORMAL LOW (ref 3.5–5.2)
Sodium: 149 mmol/L — ABNORMAL HIGH (ref 134–144)
TOTAL PROTEIN: 6.5 g/dL (ref 6.0–8.5)

## 2015-01-26 MED ORDER — POTASSIUM CHLORIDE CRYS ER 20 MEQ PO TBCR: EXTENDED_RELEASE_TABLET | ORAL | Status: DC

## 2015-01-29 ENCOUNTER — Telehealth: Payer: Self-pay | Admitting: *Deleted

## 2015-01-29 NOTE — Telephone Encounter (Signed)
Patient called and wanted to know why [facility] was called in to her pharmacy. Reviewed labs and patient's Potassium was low and Jessica faxed to pharmacy. Explained to patient and agreed. Appointment made for 1 week for patient to follow up. Lab wasn't sent to clinical due to system being messed up.

## 2015-02-05 ENCOUNTER — Ambulatory Visit (INDEPENDENT_AMBULATORY_CARE_PROVIDER_SITE_OTHER): Payer: Medicare Other | Admitting: Nurse Practitioner

## 2015-02-05 ENCOUNTER — Encounter: Payer: Self-pay | Admitting: Nurse Practitioner

## 2015-02-05 VITALS — BP 140/70 | HR 120 | Temp 98.2°F | Resp 16 | Ht 64.0 in | Wt 153.0 lb

## 2015-02-05 DIAGNOSIS — E876 Hypokalemia: Secondary | ICD-10-CM

## 2015-02-05 DIAGNOSIS — R000 Tachycardia, unspecified: Secondary | ICD-10-CM | POA: Diagnosis not present

## 2015-02-05 DIAGNOSIS — I10 Essential (primary) hypertension: Secondary | ICD-10-CM

## 2015-02-05 NOTE — Patient Instructions (Signed)
Low sodium diet Will follow up in 2-3 weeks after dental work has been complete  Make sure you are staying hydrated

## 2015-02-05 NOTE — Progress Notes (Signed)
Patient ID: [name], female   DOB: 1950-10-03, 64 y.o.   MRN: [id number]    PCP: Sharon SellerEUBANKS, Khloie Hamada K, NP  No Known Allergies  Chief Complaint  Patient presents with  . Follow-up    Follow-up from 01/25/15, low potassium and high blood pressure      HPI: Patient is a 64 y.o. female seen in the office today to follow up lab work, had low potassium on recent lab work and was prescribed 40 meq KCL for 2 days, now taking 20 meq daily.  No chest pains, shortness of breath, palpitations, leg cramps.  Review of Systems:  Review of Systems  Constitutional: Negative for fever, chills, appetite change and unexpected weight change.  HENT: Positive for dental problem (on amoxilcillin for lab work). Negative for ear pain, hearing loss and mouth sores.   Eyes: Negative for visual disturbance.  Respiratory: Negative for cough, chest tightness and shortness of breath.   Cardiovascular: Negative for chest pain, palpitations and leg swelling.  Skin: Negative for pallor and rash.  Neurological: Negative for dizziness, light-headedness and headaches.  Psychiatric/Behavioral: Negative for suicidal ideas and sleep disturbance.    Past Medical History  Diagnosis Date  . Hyperparathyroidism   . Schizophrenia   . Hypertension   . Osteopenia   . Vitamin D deficiency   . Cough   . Other abnormal blood chemistry   . Chlamydia trachomatis infection of unspecified genitourinary site 12/18/2011  . Other and unspecified hyperlipidemia   . Disorder of bone and cartilage, unspecified   . Edema   . Tobacco use disorder   . Tachycardia, unspecified 05/29/2010  . Unspecified glaucoma    No past surgical history on file. Social History:   reports that she quit smoking about 2 years ago. She does not have any smokeless tobacco history on file. She reports that she drinks alcohol. She reports that she does not use illicit drugs.  Family History  Problem Relation Age of Onset  . Hypertension Other   .  Diabetes Other   . Cancer Mother   . Cancer Brother     Throat    Medications: Patient's Medications  New Prescriptions   No medications on file  Previous Medications   ALENDRONATE (FOSAMAX) 70 MG TABLET    Take 1 tablet once weekly to treat osteoporosis.Take with a full glass of water on an empty stomach.   AMOXICILLIN (AMOXIL) 500 MG CAPSULE    1 by mouth three times daily for tooth infection, will completed Thursday 02/08/15   ATENOLOL (TENORMIN) 50 MG TABLET    TAKE 1 TABLET BY MOUTH EVERY DAY   ATORVASTATIN (LIPITOR) 20 MG TABLET    Take 1 tablet (20 mg total) by mouth daily.   BIMATOPROST (LUMIGAN) 0.03 % OPHTHALMIC SOLUTION    1 drop at bedtime. One drop in both eyes once a day for glaucoma   BRINZOLAMIDE (AZOPT) 1 % OPHTHALMIC SUSPENSION    Place 1 drop into both eyes 3 (three) times daily.     ENALAPRIL (VASOTEC) 10 MG TABLET    TAKE 1 TABLET BY MOUTH DAILY   THIORIDAZINE (MELLARIL) 50 MG TABLET    Take 50 mg by mouth. Take 2 tablets at bedtime.  Modified Medications   Modified Medication Previous Medication   POTASSIUM CHLORIDE SA (K-DUR,KLOR-CON) 20 MEQ TABLET potassium chloride SA (K-DUR,KLOR-CON) 20 MEQ tablet      1 tablet daily for supplement    Take 2 tablets (to equal 40 meq) by mouth  today and tomorrow then reduce to 1 tablet daily for supplement  Discontinued Medications   No medications on file     Physical Exam:  Filed Vitals:   02/05/15 1454  BP: 140/70  Pulse: 120  Temp: 98.2 F (36.8 C)  TempSrc: Oral  Resp: 16  Height: 5\' 4"  (1.626 m)  Weight: 153 lb (69.4 kg)  SpO2: 98%    Physical Exam  Constitutional: She is oriented to person, place, and time. She appears well-developed and well-nourished. No distress.  Neck: Normal range of motion. Neck supple.  Cardiovascular: Normal rate, regular rhythm and normal heart sounds.   Pulmonary/Chest: Effort normal and breath sounds normal.  Abdominal: Soft. Bowel sounds are normal.  Musculoskeletal: She  exhibits no edema or tenderness.  Neurological: She is alert and oriented to person, place, and time.  Skin: Skin is warm and dry. She is not diaphoretic.  Psychiatric: She has a normal mood and affect.    Labs reviewed: Basic Metabolic Panel:  Recent Labs  16/06/9605/24/15 1525 03/20/14 1326 07/18/14 1253 01/25/15 1210  NA 144  --  144 149*  K 3.7  --  3.0* 2.9*  CL 106  --  104 104  CO2 21  --  24 26  GLUCOSE 113*  --  113* 123*  BUN 7*  --  10 8  CREATININE 0.96  --  0.86 0.70  CALCIUM 11.7* 11.5* 11.3* 11.5*   Liver Function Tests:  Recent Labs  07/18/14 1253 01/25/15 1210  AST 21 40  ALT 16 45*  ALKPHOS 74 64  BILITOT 0.4 0.5  PROT 6.6 6.5   No results for input(s): LIPASE, AMYLASE in the last 8760 hours. No results for input(s): AMMONIA in the last 8760 hours. CBC:  Recent Labs  07/18/14 1253  WBC 7.7  HGB 13.1  HCT 40.3  MCV 92  PLT 152   Lipid Panel:  Recent Labs  07/18/14 1253  CHOL 145  HDL 41  LDLCALC 61  TRIG 216*  CHOLHDL 3.5   TSH: No results for input(s): TSH in the last 8760 hours. A1C: Lab Results  Component Value Date   HGBA1C 5.7* 07/18/2014     Assessment/Plan 1. Tachycardia - EKG 12-Lead, without acute findings, heart rate improved by end of visit to 90 -will cont to monitor will check TSH at next visit, also may benefit from dose adjustment in her betablocker for improved blood pressure and HR control  2. Hypokalemia -has been complaint with potassium, will follow up  Basic metabolic panel  3. Essential hypertension -conts on enalapril and atenolol    Follow up TSH at next visit

## 2015-02-06 DIAGNOSIS — H4011X2 Primary open-angle glaucoma, moderate stage: Secondary | ICD-10-CM | POA: Diagnosis not present

## 2015-02-06 LAB — BASIC METABOLIC PANEL
BUN/Creatinine Ratio: 8 — ABNORMAL LOW (ref 11–26)
BUN: 6 mg/dL — AB (ref 8–27)
CO2: 25 mmol/L (ref 18–29)
Calcium: 12.2 mg/dL — ABNORMAL HIGH (ref 8.7–10.3)
Chloride: 104 mmol/L (ref 97–108)
Creatinine, Ser: 0.73 mg/dL (ref 0.57–1.00)
GFR calc Af Amer: 102 mL/min/{1.73_m2} (ref >59)
GFR, EST NON AFRICAN AMERICAN: 89 mL/min/{1.73_m2} (ref >59)
GLUCOSE: 140 mg/dL — AB (ref 65–99)
Potassium: 2.7 mmol/L — ABNORMAL LOW (ref 3.5–5.2)
Sodium: 145 mmol/L — ABNORMAL HIGH (ref 134–144)

## 2015-02-12 DIAGNOSIS — Z1211 Encounter for screening for malignant neoplasm of colon: Secondary | ICD-10-CM | POA: Diagnosis not present

## 2015-02-12 DIAGNOSIS — Z1212 Encounter for screening for malignant neoplasm of rectum: Secondary | ICD-10-CM | POA: Diagnosis not present

## 2015-02-12 LAB — FECAL OCCULT BLOOD, GUAIAC: FECAL OCCULT BLD: NEGATIVE

## 2015-02-13 ENCOUNTER — Ambulatory Visit (INDEPENDENT_AMBULATORY_CARE_PROVIDER_SITE_OTHER): Payer: Medicare Other | Admitting: Nurse Practitioner

## 2015-02-13 ENCOUNTER — Encounter: Payer: Self-pay | Admitting: Nurse Practitioner

## 2015-02-13 VITALS — BP 118/66 | HR 88 | Temp 98.1°F | Resp 20 | Ht 62.0 in | Wt 145.4 lb

## 2015-02-13 DIAGNOSIS — E876 Hypokalemia: Secondary | ICD-10-CM

## 2015-02-13 DIAGNOSIS — I10 Essential (primary) hypertension: Secondary | ICD-10-CM

## 2015-02-13 MED ORDER — POTASSIUM CHLORIDE ER 20 MEQ PO TBCR: EXTENDED_RELEASE_TABLET | ORAL | Status: DC

## 2015-02-13 NOTE — Patient Instructions (Signed)
continue to take potassium 2 tablets daily for supplement, will get blood work today and be intouch with you.

## 2015-02-13 NOTE — Progress Notes (Signed)
Patient ID: [name], female   DOB: December 11, 1950, 64 y.o.   MRN: [id number]    PCP: [name], NP  No Known Allergies  Chief Complaint  Patient presents with  . Follow-up    1 week follow-up, Discuss labs ( copy printed)     HPI: Patient is a 64 y.o. female seen in the office today to follow up lab work.  Took potassium 20 meq 2 tablets for 2 days then 2 tablets there after. Does not like taking them because they are so big but reports compliance.  No diarrhea, N/V, denies chest pains palpitations, leg pains.   Review of Systems:  Review of Systems  Constitutional: Negative for fever, chills, appetite change and unexpected weight change.  HENT: Positive for dental problem (plans to get teeth pulled).   Eyes: Negative for visual disturbance.  Respiratory: Negative for cough, chest tightness and shortness of breath.   Cardiovascular: Negative for chest pain, palpitations and leg swelling.  Gastrointestinal: Negative for nausea, abdominal pain, diarrhea, constipation and abdominal distention.  Skin: Negative for pallor and rash.  Neurological: Negative for dizziness, light-headedness and headaches.  Psychiatric/Behavioral: Negative for suicidal ideas and sleep disturbance.    Past Medical History  Diagnosis Date  . Hyperparathyroidism   . Schizophrenia   . Hypertension   . Osteopenia   . Vitamin D deficiency   . Cough   . Other abnormal blood chemistry   . Chlamydia trachomatis infection of unspecified genitourinary site 12/18/2011  . Other and unspecified hyperlipidemia   . Disorder of bone and cartilage, unspecified   . Edema   . Tobacco use disorder   . Tachycardia, unspecified 05/29/2010  . Unspecified glaucoma    History reviewed. No pertinent past surgical history. Social History:   reports that she quit smoking about 2 years ago. She does not have any smokeless tobacco history on file. She reports that she drinks alcohol. She reports that she does  not use illicit drugs.  Family History  Problem Relation Age of Onset  . Hypertension Other   . Diabetes Other   . Cancer Mother   . Cancer Brother     Throat    Medications: Patient's Medications  New Prescriptions   No medications on file  Previous Medications   ALENDRONATE (FOSAMAX) 70 MG TABLET    Take 1 tablet once weekly to treat osteoporosis.Take with a full glass of water on an empty stomach.   ATENOLOL (TENORMIN) 50 MG TABLET    TAKE 1 TABLET BY MOUTH EVERY DAY   ATORVASTATIN (LIPITOR) 20 MG TABLET    Take 1 tablet (20 mg total) by mouth daily.   ENALAPRIL (VASOTEC) 10 MG TABLET    TAKE 1 TABLET BY MOUTH DAILY   POTASSIUM CHLORIDE SA (K-DUR,KLOR-CON) 20 MEQ TABLET    2 by mouth two times daily x 2 days then 2 by mouth once daily, recheck potassium in 1 week   THIORIDAZINE (MELLARIL) 50 MG TABLET    Take 50 mg by mouth. Take 2 tablets at bedtime.  Modified Medications   No medications on file  Discontinued Medications   AMOXICILLIN (AMOXIL) 500 MG CAPSULE    1 by mouth three times daily for tooth infection, will completed Thursday 02/08/15   BIMATOPROST (LUMIGAN) 0.03 % OPHTHALMIC SOLUTION    1 drop at bedtime. One drop in both eyes once a day for glaucoma   BRINZOLAMIDE (AZOPT) 1 % OPHTHALMIC SUSPENSION    Place 1 drop into both  eyes 3 (three) times daily.       Physical Exam:  Filed Vitals:   02/13/15 0944  BP: 118/66  Pulse: 88  Temp: 98.1 F (36.7 C)  TempSrc: Oral  Resp: 20  Height: 5\' 2"  (1.575 m)  Weight: 145 lb 6.4 oz (65.953 kg)  SpO2: 97%    Physical Exam  Constitutional: She is oriented to person, place, and time. She appears well-developed and well-nourished. No distress.  Neck: Normal range of motion. Neck supple.  Cardiovascular: Normal rate, regular rhythm and normal heart sounds.   Pulmonary/Chest: Effort normal and breath sounds normal.  Abdominal: Soft. Bowel sounds are normal.  Musculoskeletal: She exhibits no edema or tenderness.    Neurological: She is alert and oriented to person, place, and time.  Skin: Skin is warm and dry. She is not diaphoretic.  Psychiatric: She has a normal mood and affect.    Labs reviewed: Basic Metabolic Panel:  Recent Labs  16/06/9609/27/15 1253 01/25/15 1210 02/05/15 1544  NA 144 149* 145*  K 3.0* 2.9* 2.7*  CL 104 104 104  CO2 24 26 25   GLUCOSE 113* 123* 140*  BUN 10 8 6*  CREATININE 0.86 0.70 0.73  CALCIUM 11.3* 11.5* 12.2*   Liver Function Tests:  Recent Labs  07/18/14 1253 01/25/15 1210  AST 21 40  ALT 16 45*  ALKPHOS 74 64  BILITOT 0.4 0.5  PROT 6.6 6.5   No results for input(s): LIPASE, AMYLASE in the last 8760 hours. No results for input(s): AMMONIA in the last 8760 hours. CBC:  Recent Labs  07/18/14 1253  WBC 7.7  HGB 13.1  HCT 40.3  MCV 92  PLT 152   Lipid Panel:  Recent Labs  07/18/14 1253  CHOL 145  HDL 41  LDLCALC 61  TRIG 216*  CHOLHDL 3.5   TSH: No results for input(s): TSH in the last 8760 hours. A1C: Lab Results  Component Value Date   HGBA1C 5.7* 07/18/2014     Assessment/Plan 1. Hypokalemia -compliance with medications, medications reviewed, no obvious cause to why her potassium is low.  - Basic metabolic panel - potassium chloride 20 MEQ TBCR; Take 2 tablets to equal 40 meq daily for potassium supplement  Dispense: 60 tablet; Refill: 2  2. Essential hypertension Improved today, will cont current regimen

## 2015-02-14 ENCOUNTER — Telehealth: Payer: Self-pay | Admitting: Nurse Practitioner

## 2015-02-14 ENCOUNTER — Observation Stay (HOSPITAL_COMMUNITY)
Admission: EM | Admit: 2015-02-14 | Discharge: 2015-02-16 | Disposition: A | Discharge status: Home / Self Care | Payer: Medicare Other | Attending: Internal Medicine | Admitting: Internal Medicine

## 2015-02-14 ENCOUNTER — Encounter (HOSPITAL_COMMUNITY): Payer: Self-pay | Admitting: Family Medicine

## 2015-02-14 DIAGNOSIS — F209 Schizophrenia, unspecified: Secondary | ICD-10-CM | POA: Diagnosis present

## 2015-02-14 DIAGNOSIS — E785 Hyperlipidemia, unspecified: Secondary | ICD-10-CM | POA: Diagnosis not present

## 2015-02-14 DIAGNOSIS — E559 Vitamin D deficiency, unspecified: Secondary | ICD-10-CM | POA: Diagnosis not present

## 2015-02-14 DIAGNOSIS — E876 Hypokalemia: Secondary | ICD-10-CM | POA: Insufficient documentation

## 2015-02-14 DIAGNOSIS — H409 Unspecified glaucoma: Secondary | ICD-10-CM | POA: Diagnosis not present

## 2015-02-14 DIAGNOSIS — I10 Essential (primary) hypertension: Secondary | ICD-10-CM | POA: Insufficient documentation

## 2015-02-14 DIAGNOSIS — E210 Primary hyperparathyroidism: Principal | ICD-10-CM | POA: Diagnosis present

## 2015-02-14 HISTORY — DX: Hypercalcemia: E83.52

## 2015-02-14 LAB — CBC
HCT: 40.5 % (ref 36.0–46.0)
Hemoglobin: 13 g/dL (ref 12.0–15.0)
MCH: 29.9 pg (ref 26.0–34.0)
MCHC: 32.1 g/dL (ref 30.0–36.0)
MCV: 93.1 fL (ref 78.0–100.0)
PLATELETS: 199 10*3/uL (ref 150–400)
RBC: 4.35 MIL/uL (ref 3.87–5.11)
RDW: 13.1 % (ref 11.5–15.5)
WBC: 7.9 10*3/uL (ref 4.0–10.5)

## 2015-02-14 LAB — BASIC METABOLIC PANEL
BUN/Creatinine Ratio: 14 (ref 11–26)
BUN: 12 mg/dL (ref 8–27)
CHLORIDE: 101 mmol/L (ref 97–108)
CO2: 20 mmol/L (ref 18–29)
Calcium: 14.5 mg/dL (ref 8.7–10.3)
Creatinine, Ser: 0.85 mg/dL (ref 0.57–1.00)
GFR calc Af Amer: 85 mL/min/{1.73_m2} (ref >59)
GFR calc non Af Amer: 74 mL/min/{1.73_m2} (ref >59)
Glucose: 88 mg/dL (ref 65–99)
Potassium: 5.1 mmol/L (ref 3.5–5.2)
Sodium: 138 mmol/L (ref 134–144)

## 2015-02-14 LAB — COMPREHENSIVE METABOLIC PANEL
ALK PHOS: 58 U/L (ref 38–126)
ALT: 40 U/L (ref 14–54)
ANION GAP: 9 (ref 5–15)
AST: 38 U/L (ref 15–41)
Albumin: 4.3 g/dL (ref 3.5–5.0)
BUN: 13 mg/dL (ref 6–20)
CO2: 20 mmol/L — AB (ref 22–32)
CREATININE: 1.03 mg/dL — AB (ref 0.44–1.00)
Calcium: 14.2 mg/dL (ref 8.9–10.3)
Chloride: 107 mmol/L (ref 101–111)
GFR calc Af Amer: >60 mL/min (ref >60)
GFR calc non Af Amer: 57 mL/min — ABNORMAL LOW (ref >60)
GLUCOSE: 136 mg/dL — AB (ref 65–99)
POTASSIUM: 4.9 mmol/L (ref 3.5–5.1)
Sodium: 136 mmol/L (ref 135–145)
TOTAL PROTEIN: 7.5 g/dL (ref 6.5–8.1)
Total Bilirubin: 0.8 mg/dL (ref 0.3–1.2)

## 2015-02-14 LAB — CREATININE, SERUM
Creatinine, Ser: 0.88 mg/dL (ref 0.44–1.00)
GFR calc Af Amer: >60 mL/min (ref >60)

## 2015-02-14 LAB — CBC WITH DIFFERENTIAL/PLATELET
Basophils Absolute: 0 10*3/uL (ref 0.0–0.1)
Basophils Relative: 0 % (ref 0–1)
EOS PCT: 1 % (ref 0–5)
Eosinophils Absolute: 0.1 10*3/uL (ref 0.0–0.7)
HCT: 42.3 % (ref 36.0–46.0)
Hemoglobin: 14 g/dL (ref 12.0–15.0)
LYMPHS ABS: 2.7 10*3/uL (ref 0.7–4.0)
Lymphocytes Relative: 33 % (ref 12–46)
MCH: 30.6 pg (ref 26.0–34.0)
MCHC: 33.1 g/dL (ref 30.0–36.0)
MCV: 92.6 fL (ref 78.0–100.0)
MONO ABS: 0.7 10*3/uL (ref 0.1–1.0)
MONOS PCT: 8 % (ref 3–12)
Neutro Abs: 4.7 10*3/uL (ref 1.7–7.7)
Neutrophils Relative %: 58 % (ref 43–77)
PLATELETS: 210 10*3/uL (ref 150–400)
RBC: 4.57 MIL/uL (ref 3.87–5.11)
RDW: 13.1 % (ref 11.5–15.5)
WBC: 8.2 10*3/uL (ref 4.0–10.5)

## 2015-02-14 MED ORDER — HEPARIN SODIUM (PORCINE) 5000 UNIT/ML IJ SOLN
5000.0000 [IU] | Freq: Three times a day (TID) | INTRAMUSCULAR | Status: DC
  Administered 2015-02-14 – 2015-02-16 (×5): 5000 [IU] via SUBCUTANEOUS
  Filled 2015-02-14 (×7): qty 1

## 2015-02-14 MED ORDER — ACETAMINOPHEN 325 MG PO TABS: 650.0000 mg | ORAL_TABLET | Freq: Four times a day (QID) | ORAL | Status: DC | PRN

## 2015-02-14 MED ORDER — FUROSEMIDE 10 MG/ML IJ SOLN
20.0000 mg | Freq: Two times a day (BID) | INTRAMUSCULAR | Status: DC
  Administered 2015-02-14 – 2015-02-15 (×2): 20 mg via INTRAVENOUS
  Filled 2015-02-14 (×4): qty 2

## 2015-02-14 MED ORDER — ONDANSETRON HCL 4 MG/2ML IJ SOLN: 4.0000 mg | Freq: Four times a day (QID) | INTRAMUSCULAR | Status: DC | PRN

## 2015-02-14 MED ORDER — CINACALCET HCL 30 MG PO TABS
30.0000 mg | ORAL_TABLET | Freq: Two times a day (BID) | ORAL | Status: DC
  Administered 2015-02-14 – 2015-02-16 (×5): 30 mg via ORAL
  Filled 2015-02-14 (×6): qty 1

## 2015-02-14 MED ORDER — HYDROCODONE-ACETAMINOPHEN 5-325 MG PO TABS: 1.0000 | ORAL_TABLET | ORAL | Status: DC | PRN

## 2015-02-14 MED ORDER — SODIUM CHLORIDE 0.9 % IV SOLN
INTRAVENOUS | Status: AC
  Administered 2015-02-14: 15:00:00 via INTRAVENOUS

## 2015-02-14 MED ORDER — ATENOLOL 50 MG PO TABS
50.0000 mg | ORAL_TABLET | Freq: Every day | ORAL | Status: DC
  Administered 2015-02-15 – 2015-02-16 (×2): 50 mg via ORAL
  Filled 2015-02-14 (×2): qty 1

## 2015-02-14 MED ORDER — ACETAMINOPHEN 650 MG RE SUPP: 650.0000 mg | Freq: Four times a day (QID) | RECTAL | Status: DC | PRN

## 2015-02-14 MED ORDER — ALENDRONATE SODIUM 70 MG PO TABS: 70.0000 mg | ORAL_TABLET | ORAL | Status: DC

## 2015-02-14 MED ORDER — BISACODYL 10 MG RE SUPP: 10.0000 mg | Freq: Every day | RECTAL | Status: DC | PRN

## 2015-02-14 MED ORDER — DORZOLAMIDE HCL 2 % OP SOLN
1.0000 [drp] | Freq: Three times a day (TID) | OPHTHALMIC | Status: DC
  Administered 2015-02-14 – 2015-02-16 (×7): 1 [drp] via OPHTHALMIC
  Filled 2015-02-14: qty 10

## 2015-02-14 MED ORDER — LATANOPROST 0.005 % OP SOLN
1.0000 [drp] | Freq: Every day | OPHTHALMIC | Status: DC
  Administered 2015-02-14 – 2015-02-15 (×2): 1 [drp] via OPHTHALMIC
  Filled 2015-02-14: qty 2.5

## 2015-02-14 MED ORDER — ONDANSETRON HCL 4 MG/2ML IJ SOLN: 4.0000 mg | Freq: Three times a day (TID) | INTRAMUSCULAR | Status: DC | PRN

## 2015-02-14 MED ORDER — ATORVASTATIN CALCIUM 20 MG PO TABS
20.0000 mg | ORAL_TABLET | Freq: Every day | ORAL | Status: DC
  Administered 2015-02-15 – 2015-02-16 (×2): 20 mg via ORAL
  Filled 2015-02-14 (×2): qty 1

## 2015-02-14 MED ORDER — SODIUM CHLORIDE 0.9 % IV SOLN
INTRAVENOUS | Status: DC
  Administered 2015-02-14: 17:00:00 via INTRAVENOUS

## 2015-02-14 MED ORDER — SODIUM CHLORIDE 0.9 % IV SOLN: INTRAVENOUS | Status: DC

## 2015-02-14 MED ORDER — POLYETHYLENE GLYCOL 3350 17 G PO PACK
17.0000 g | PACK | Freq: Every day | ORAL | Status: DC | PRN
  Filled 2015-02-14: qty 1

## 2015-02-14 MED ORDER — SODIUM CHLORIDE 0.9 % IV BOLUS (SEPSIS)
1000.0000 mL | Freq: Once | INTRAVENOUS | Status: AC
  Administered 2015-02-14: 1000 mL via INTRAVENOUS

## 2015-02-14 MED ORDER — ONDANSETRON HCL 4 MG PO TABS: 4.0000 mg | ORAL_TABLET | Freq: Four times a day (QID) | ORAL | Status: DC | PRN

## 2015-02-14 MED ORDER — THIORIDAZINE HCL 100 MG PO TABS
100.0000 mg | ORAL_TABLET | Freq: Every day | ORAL | Status: DC
  Administered 2015-02-14 – 2015-02-15 (×2): 100 mg via ORAL
  Filled 2015-02-14 (×3): qty 1

## 2015-02-14 MED ORDER — ENALAPRIL MALEATE 10 MG PO TABS
10.0000 mg | ORAL_TABLET | Freq: Every day | ORAL | Status: DC
  Administered 2015-02-15 – 2015-02-16 (×2): 10 mg via ORAL
  Filled 2015-02-14 (×2): qty 1

## 2015-02-14 NOTE — ED Notes (Signed)
Pt here for elevated calcium of 14. Sent here by doctor. Denies any problems, symptoms, complaints.

## 2015-02-14 NOTE — Progress Notes (Signed)
Report given.

## 2015-02-14 NOTE — ED Provider Notes (Signed)
CSN: [id number]     Arrival date & time 02/14/15  1013 History   First MD Initiated Contact with Patient 02/14/15 1031     Chief Complaint  Patient presents with  . elevated calcium      (Consider location/radiation/quality/duration/timing/severity/associated sxs/prior Treatment) HPI Comments: Patient with history of primary hyperparathyroidism on Zometa per her endocrinologist -- presents with complaint of elevated calcium. Patient had a office visit yesterday and had labs drawn. Her calcium was found to be greater than 14.5 and patient was referred to the emergency department. Patient denies weakness, chest pain, shortness of breath. No urinary symptoms. She has had constipation for which she has taken a laxitive. No nausea or vomiting. No abdominal pain. No bone pain. No new or worsening fatigue. No bradycardia, lightheadedness, or dizziness. No new confusion or hallucinations. Patient is on potassium supplementation. No evidence of calcium supplementation. She was told to stop multivitamin last year.   The history is provided by the patient and medical records.    Past Medical History  Diagnosis Date  . Hyperparathyroidism   . Schizophrenia   . Hypertension   . Osteopenia   . Vitamin D deficiency   . Cough   . Other abnormal blood chemistry   . Chlamydia trachomatis infection of unspecified genitourinary site 12/18/2011  . Other and unspecified hyperlipidemia   . Disorder of bone and cartilage, unspecified   . Edema   . Tobacco use disorder   . Tachycardia, unspecified 05/29/2010  . Unspecified glaucoma    History reviewed. No pertinent past surgical history. Family History  Problem Relation Age of Onset  . Hypertension Other   . Diabetes Other   . Cancer Mother   . Cancer Brother     Throat   History  Substance Use Topics  . Smoking status: Former Smoker    Quit date: 09/22/2012  . Smokeless tobacco: Not on file     Comment: Quite age 43   . Alcohol Use: Yes   OB  History    No data available     Review of Systems  Constitutional: Negative for fever.  HENT: Negative for rhinorrhea and sore throat.   Eyes: Negative for redness.  Respiratory: Negative for cough.   Cardiovascular: Negative for chest pain.  Gastrointestinal: Positive for constipation. Negative for nausea, vomiting, abdominal pain and diarrhea.  Genitourinary: Negative for dysuria and hematuria.  Musculoskeletal: Negative for myalgias.  Skin: Negative for rash.  Neurological: Negative for headaches.      Allergies  Review of patient's allergies indicates no known allergies.  Home Medications   Prior to Admission medications   Medication Sig Start Date End Date Taking? Authorizing Provider  alendronate (FOSAMAX) 70 MG tablet Take 1 tablet once weekly to treat osteoporosis.Take with a full glass of water on an empty stomach. 07/18/14   Oneal Grout, MD  atenolol (TENORMIN) 50 MG tablet TAKE 1 TABLET BY MOUTH EVERY DAY 11/17/14   Oneal Grout, MD  atorvastatin (LIPITOR) 20 MG tablet Take 1 tablet (20 mg total) by mouth daily. 07/25/14   Oneal Grout, MD  dorzolamide (TRUSOPT) 2 % ophthalmic solution Place 1 drop into both eyes 3 (three) times daily.    Historical Provider, MD  enalapril (VASOTEC) 10 MG tablet TAKE 1 TABLET BY MOUTH DAILY 01/24/15   Kimber Relic, MD  latanoprost (XALATAN) 0.005 % ophthalmic solution Place 1 drop into both eyes at bedtime.    Historical Provider, MD  potassium chloride 20 MEQ TBCR Take 2  tablets to equal 40 meq daily for potassium supplement 02/13/15   Sharon SellerJessica K Eubanks, NP  thioridazine (MELLARIL) 50 MG tablet Take 50 mg by mouth. Take 2 tablets at bedtime.    Historical Provider, MD   BP 153/72 mmHg  Pulse 96  Temp(Src) 97.9 F (36.6 C)  Resp 18  SpO2 100%   Physical Exam  Constitutional: She is oriented to person, place, and time. She appears well-developed and well-nourished.  HENT:  Head: Normocephalic and atraumatic.  Mouth/Throat:  Oropharynx is clear and moist.  Eyes: Conjunctivae are normal. Right eye exhibits no discharge. Left eye exhibits no discharge.  Neck: Normal range of motion. Neck supple.  Cardiovascular: Normal rate, regular rhythm and normal heart sounds.   No murmur heard. Pulmonary/Chest: Effort normal and breath sounds normal. No respiratory distress. She has no wheezes. She has no rales.  Abdominal: Soft. There is no tenderness.  Neurological: She is alert and oriented to person, place, and time. She has normal reflexes. No cranial nerve deficit. Coordination normal.  Skin: Skin is warm and dry.  Psychiatric: She has a normal mood and affect.  Nursing note and vitals reviewed.   ED Course  Procedures (including critical care time) Labs Review Labs Reviewed  COMPREHENSIVE METABOLIC PANEL - Abnormal; Notable for the following:    CO2 20 (*)    Glucose, Bld 136 (*)    Creatinine, Ser 1.03 (*)    Calcium 14.2 (*)    GFR calc non Af Amer 57 (*)    All other components within normal limits  CBC WITH DIFFERENTIAL/PLATELET  PARATHYROID HORMONE, INTACT (NO CA)  CALCIUM, IONIZED    Imaging Review No results found.   EKG Interpretation None          11:05 AM Patient seen and examined. Work-up initiated. Medications ordered. EKG reviewed. No bradycardia or short Qtc.= 397.   Vital signs reviewed and are as follows: BP 153/72 mmHg  Pulse 96  Temp(Src) 97.9 F (36.6 C)  Resp 18  SpO2 100%  12:40 PM Admit to [facility].   MDM   Final diagnoses:  Hypercalcemia   Patient with mildly symptomatic hypercalcemia. Admit.    Renne CriglerJoshua Samanthamarie Ezzell, PA-C 02/14/15 1241  Geoffery Lyonsouglas Delo, MD 02/14/15 [phone]

## 2015-02-14 NOTE — ED Notes (Signed)
Pt undressed, in gown, on monitor 

## 2015-02-14 NOTE — Progress Notes (Signed)
PHARMACIST - PHYSICIAN COMMUNICATION  CONCERNING: P&T Medication Policy Regarding Oral Bisphosphonates  RECOMMENDATION: Your order for alendronate (Fosamax), ibandronate (Boniva), or risedronate (Actonel) has been discontinued at this time.  If the patient's post-hospital medical condition warrants safe use of this class of drugs, please resume the pre-hospital regimen upon discharge.  DESCRIPTION:  Alendronate (Fosamax), ibandronate (Boniva), and risedronate (Actonel) can cause severe esophageal erosions in patients who are unable to remain upright at least 30 minutes after taking this medication.   Since brief interruptions in therapy are thought to have minimal impact on bone mineral density, the Pharmacy & Therapeutics Committee has established that bisphosphonate orders should be routinely discontinued during hospitalization.   To override this safety policy and permit administration of Boniva, Fosamax, or Actonel in the hospital, prescribers must write "DO NOT HOLD" in the comments section when placing the order for this class of medications.   Baldemar FridayMasters, Britania Shreeve M  02/14/2015 4:24 PM

## 2015-02-14 NOTE — ED Notes (Signed)
Pt reports her MD told her to come in because her calcium was elevated. Pt admits she does take calcium pills. Denies any symptoms.

## 2015-02-14 NOTE — ED Notes (Signed)
Unable to obtain IV access X 2.  

## 2015-02-14 NOTE — H&P (Signed)
Patient Demographics  [name], is a 64 y.o. female  MRN: [id number]   DOB - June 24, 1951  Admit Date - 02/14/2015  Outpatient Primary MD for the patient is [name], NP   With History of -  Past Medical History  Diagnosis Date  . Hyperparathyroidism   . Schizophrenia   . Hypertension   . Osteopenia   . Vitamin D deficiency   . Cough   . Other abnormal blood chemistry   . Chlamydia trachomatis infection of unspecified genitourinary site 12/18/2011  . Other and unspecified hyperlipidemia   . Disorder of bone and cartilage, unspecified   . Edema   . Tobacco use disorder   . Tachycardia, unspecified 05/29/2010  . Unspecified glaucoma       History reviewed. No pertinent past surgical history.  in for   Chief Complaint  Patient presents with  . elevated calcium      HPI  [name]  is a 64 y.o. female, with past medical history of primary hyperparathyroidism, hypertension, glaucoma, osteoporosis, schizophrenia, vitamin D deficiency, patient was sent by her PCP for hypercalcemia ,patient with knownHistory of primary hyperparathyroidism, following with Dr. Talmage Nap, from endocrinology, patient reports there are no plans for surgery as of recently, patient has been taking Fosamax, and Zometa infusions (reports most recent infusion was in earlier this month her hypercalcemia, baseline calcium is 11.5, at [facility] office yesterday was 14.5, so they called her and requested her to come to ED today, patient denies any bone pain, fatigue, paresthesias, cramps, only endorses constipation, patient started on IV fluids in ED, and hospitalist service requested to admit, patient reports she is on calcium supplement, even though it could not be verified by her medication list, As well reports she was recently started on potassium supplement given her hypokalemia .   Review of Systems    In addition to the HPI above, No Fever-chills, No Headache, No changes with Vision or  hearing, No problems swallowing food or Liquids, No Chest pain, Cough or Shortness of Breath, No Abdominal pain, No Nausea or Vommitting,  reports constipation No Blood in stool or Urine, No dysuria, No new skin rashes or bruises, No new joints pains-aches,  No new weakness, tingling, numbness in any extremity, No recent weight gain or loss, No polyuria, polydypsia or polyphagia, No significant Mental Stressors.  A full 10 point Review of Systems was done, except as stated above, all other Review of Systems were negative.   Social History History  Substance Use Topics  . Smoking status: Former Smoker    Quit date: 09/22/2012  . Smokeless tobacco: Not on file     Comment: Quite age 60   . Alcohol Use: Yes     Family History Family History  Problem Relation Age of Onset  . Hypertension Other   . Diabetes Other   . Cancer Mother   . Cancer Brother     Throat     Prior to Admission medications   Medication Sig Start Date End Date Taking? Authorizing Provider  alendronate (FOSAMAX) 70 MG tablet Take 1 tablet once weekly to treat osteoporosis.Take with a full glass of water on an empty stomach. 07/18/14  Yes Mahima Pandey, MD  atenolol (TENORMIN) 50 MG tablet TAKE 1 TABLET BY MOUTH EVERY DAY 11/17/14  Yes Mahima Pandey, MD  atorvastatin (LIPITOR) 20 MG tablet Take 1 tablet (20 mg total) by mouth daily. 07/25/14  Yes Mahima Pandey, MD  dorzolamide (TRUSOPT) 2 %  ophthalmic solution Place 1 drop into both eyes 3 (three) times daily.   Yes Historical Provider, MD  enalapril (VASOTEC) 10 MG tablet TAKE 1 TABLET BY MOUTH DAILY 01/24/15  Yes Kimber RelicArthur G Green, MD  latanoprost (XALATAN) 0.005 % ophthalmic solution Place 1 drop into both eyes at bedtime.   Yes Historical Provider, MD  potassium chloride 20 MEQ TBCR Take 2 tablets to equal 40 meq daily for potassium supplement 02/13/15  Yes Sharon SellerJessica K Eubanks, NP  thioridazine (MELLARIL) 50 MG tablet Take 50 mg by mouth. Take 2 tablets at  bedtime.    Historical Provider, MD    No Known Allergies  Physical Exam  Vitals  Blood pressure 129/63, pulse 87, temperature 97.9 F (36.6 C), resp. rate 13, SpO2 100 %.   1. General  elderly female  lying in bed in NAD,    2. Normal affect and insight, Not Suicidal or Homicidal, Awake Alert, Oriented X 3.  3. No F.N deficits, ALL C.Nerves Intact, Strength 5/5 all 4 extremities, Sensation intact all 4 extremities, Plantars down going.  4. Ears and Eyes appear Normal, Conjunctivae clear, PERRLA. Moist Oral Mucosa.  5. Supple Neck, No JVD, No cervical lymphadenopathy appriciated, No Carotid Bruits.  6. Symmetrical Chest wall movement, Good air movement bilaterally, CTAB.  7. RRR, No Gallops, Rubs or Murmurs, No Parasternal Heave.  8. Positive Bowel Sounds, Abdomen Soft, No tenderness, No organomegaly appriciated,No rebound -guarding or rigidity.  9.  No Cyanosis, Normal Skin Turgor, No Skin Rash or Bruise.  10. Good muscle tone,  joints appear normal , no effusions, Normal ROM.  11. No Palpable Lymph Nodes in Neck or Axillae    Data Review  CBC  Recent Labs Lab 02/14/15 1041  WBC 8.2  HGB 14.0  HCT 42.3  PLT 210  MCV 92.6  MCH 30.6  MCHC 33.1  RDW 13.1  LYMPHSABS 2.7  MONOABS 0.7  EOSABS 0.1  BASOSABS 0.0   ------------------------------------------------------------------------------------------------------------------  Chemistries   Recent Labs Lab 02/13/15 1029 02/14/15 1041  NA 138 136  K 5.1 4.9  CL 101 107  CO2 20 20*  GLUCOSE 88 136*  BUN 12 13  CREATININE 0.85 1.03*  CALCIUM 14.5* 14.2*  AST  --  38  ALT  --  40  ALKPHOS  --  58  BILITOT  --  0.8   ------------------------------------------------------------------------------------------------------------------ estimated creatinine clearance is 50.5 mL/min (by C-G formula based on Cr of  1.03). ------------------------------------------------------------------------------------------------------------------ No results for input(s): TSH, T4TOTAL, T3FREE, THYROIDAB in the last 72 hours.  Invalid input(s): FREET3   Coagulation profile No results for input(s): INR, PROTIME in the last 168 hours. ------------------------------------------------------------------------------------------------------------------- No results for input(s): DDIMER in the last 72 hours. -------------------------------------------------------------------------------------------------------------------  Cardiac Enzymes No results for input(s): CKMB, TROPONINI, MYOGLOBIN in the last 168 hours.  Invalid input(s): CK ------------------------------------------------------------------------------------------------------------------ Invalid input(s): POCBNP   ---------------------------------------------------------------------------------------------------------------  Urinalysis No results found for: COLORURINE, APPEARANCEUR, LABSPEC, PHURINE, GLUCOSEU, HGBUR, BILIRUBINUR, KETONESUR, PROTEINUR, UROBILINOGEN, NITRITE, LEUKOCYTESUR  ----------------------------------------------------------------------------------------------------------------  Imaging results:   No results found.  My personal review of EKG: Rhythm NSR, Rate  82 /min, QTc 397  , J-point elevation in inferior leads.    Assessment & Plan  Principal Problem:   Hyperlipidemia Active Problems:   Hyperparathyroidism, primary   Schizophrenia   Glaucoma   Hypercalcemia    Hypercalcemia secondary to primary hyperparathyroidism  - Patient is asymptomatic , presented aftershe was called by her  PCP for abnormal labs , no EKG changes . -  We'll start on IV normal saline, received 1 L fluid bolus in ED, will continue at 125 mL/h, and start on Lasix 20 mg IV twice a day . - Will start on Sensipar 30 mg twice a day  - Will continue  with Fosamax , plan was discussed with nephrology Dr. Hyman Hopes, if no improvement with calcium level with current management, may need official nephrology consult. - We'll check ionized calcium, PTH intact, PTH related peptide for baseline. - hold calcium supplements - Monitor on telemetry  -Continue  to follow with endocrinology as an outpatient dr Talmage Nap.   Hyperlipidemia - Continue with statin  History of schizophrenia  - Continue with home medication   Glaucoma - Continue with home medication  Hypertension - Continue with home medication  History of hypokalemia - Potassium is currently on higher normal side, will hold supplement, but anticipates it will drop on diuresis, will monitor closely, and will resume potassium supplement if needed.  DVT Prophylaxis Heparin -   AM Labs Ordered, also please review Full Orders  Family Communication: Admission, patients condition and plan of care including tests being ordered have been discussed with the patient  who indicate understanding and agree with the plan and Code Status.  Code Status full  Likely DC to  home when stable   Condition GUARDED    Time spent in minutes : 55 minutes     Ronni Osterberg M.D on 02/14/2015 at 1:03 PM  Between 7am to 7pm - Pager - [phone]  After 7pm go to [email or web address] - password [email or web address]  And look for the night coverage person covering me after hours  [facility] Office  [phone]

## 2015-02-14 NOTE — Progress Notes (Signed)
Pt admitted to the unit at 1400. Pt mental status is alert and oriented. Pt oriented to room, staff, and call bell. Skin is intact. Full assessment charted in [facility]. Call bell within reach. Visitor guidelines reviewed w/ pt and/or family.

## 2015-02-14 NOTE — Telephone Encounter (Signed)
Dr Leanord Hawkingobson called early this morning for critical lab, who called to notify and discuss plan of care due to elevated calcium. Pt with pmh of schizophrenia and resist to go to the ED in the past.  Elevated calcium of > 14.5 on most recent labs, pt being treated by endocrine for hyperparathyroidism by Dr Lurene ShadowBallen and was infused on 01/24/15 with Zometa. Dr Lurene ShadowBallen called with lab results for consult (hospitalization vs outpt options). it was decided pt needed to go to ED for IV fluid and treatment of elevated calcium. Pt called and informed of lab results and agreeable to go to the hospital. Pt wished to go to [place] and we notified the ED she was on her way.

## 2015-02-15 DIAGNOSIS — E210 Primary hyperparathyroidism: Secondary | ICD-10-CM

## 2015-02-15 DIAGNOSIS — E785 Hyperlipidemia, unspecified: Secondary | ICD-10-CM | POA: Diagnosis not present

## 2015-02-15 DIAGNOSIS — F209 Schizophrenia, unspecified: Secondary | ICD-10-CM

## 2015-02-15 LAB — COMPREHENSIVE METABOLIC PANEL
ALT: 36 U/L (ref 14–54)
AST: 34 U/L (ref 15–41)
Albumin: 3.4 g/dL — ABNORMAL LOW (ref 3.5–5.0)
Alkaline Phosphatase: 45 U/L (ref 38–126)
Anion gap: 6 (ref 5–15)
BILIRUBIN TOTAL: 0.5 mg/dL (ref 0.3–1.2)
BUN: 10 mg/dL (ref 6–20)
CO2: 18 mmol/L — ABNORMAL LOW (ref 22–32)
Calcium: 12.4 mg/dL — ABNORMAL HIGH (ref 8.9–10.3)
Chloride: 115 mmol/L — ABNORMAL HIGH (ref 101–111)
Creatinine, Ser: 0.81 mg/dL (ref 0.44–1.00)
GFR calc Af Amer: >60 mL/min (ref >60)
GFR calc non Af Amer: >60 mL/min (ref >60)
GLUCOSE: 88 mg/dL (ref 65–99)
Potassium: 4.2 mmol/L (ref 3.5–5.1)
SODIUM: 139 mmol/L (ref 135–145)
Total Protein: 6 g/dL — ABNORMAL LOW (ref 6.5–8.1)

## 2015-02-15 LAB — CBC
HCT: 39.1 % (ref 36.0–46.0)
Hemoglobin: 12.5 g/dL (ref 12.0–15.0)
MCH: 30.3 pg (ref 26.0–34.0)
MCHC: 32 g/dL (ref 30.0–36.0)
MCV: 94.7 fL (ref 78.0–100.0)
Platelets: 166 10*3/uL (ref 150–400)
RBC: 4.13 MIL/uL (ref 3.87–5.11)
RDW: 13.3 % (ref 11.5–15.5)
WBC: 5.2 10*3/uL (ref 4.0–10.5)

## 2015-02-15 LAB — PARATHYROID HORMONE, INTACT (NO CA)
PTH: 222 pg/mL — ABNORMAL HIGH (ref 15–65)
PTH: 48 pg/mL (ref 15–65)

## 2015-02-15 LAB — CALCIUM, IONIZED
CALCIUM, IONIZED, SERUM: 7.7 mg/dL — AB (ref 4.5–5.6)
Calcium, Ionized, Serum: 7.6 mg/dL — ABNORMAL HIGH (ref 4.5–5.6)

## 2015-02-15 NOTE — Evaluation (Addendum)
Physical Therapy Evaluation/Discharge Patient Details Name: [name] MRN: [id number] DOB: [birth year] Today's Date: 02/15/2015   History of Present Illness  Pt adm with Hypercalcemia secondary to primary hyperparathyroidism. PMH - schizophrenia, HTN  Clinical Impression  Pt doing well with mobility and no further PT needed.  Ready for dc from PT standpoint.      Follow Up Recommendations No PT follow up    Equipment Recommendations  None recommended by PT    Recommendations for Other Services       Precautions / Restrictions Precautions Precautions: None      Mobility  Bed Mobility Overal bed mobility: Independent                Transfers Overall transfer level: Independent Equipment used: None                Ambulation/Gait Ambulation/Gait assistance: Modified independent (Device/Increase time) Ambulation Distance (Feet): 500 Feet Assistive device: None Gait Pattern/deviations: WFL(Within Functional Limits)        Stairs            Wheelchair Mobility    Modified Rankin (Stroke Patients Only)       Balance Overall balance assessment: No apparent balance deficits (not formally assessed)                                           Pertinent Vitals/Pain Pain Assessment: No/denies pain    Home Living Family/patient expects to be discharged to:: Private residence Living Arrangements: Alone;Other (Comment) (husband sometimes)                    Prior Function Level of Independence: Independent               Hand Dominance        Extremity/Trunk Assessment   Upper Extremity Assessment: Overall WFL for tasks assessed           Lower Extremity Assessment: Overall WFL for tasks assessed      Cervical / Trunk Assessment: Normal  Communication   Communication: No difficulties  Cognition Arousal/Alertness: Awake/alert Behavior During Therapy: WFL for tasks assessed/performed Overall  Cognitive Status: Within Functional Limits for tasks assessed                      General Comments      Exercises        Assessment/Plan    PT Assessment Patent does not need any further PT services  PT Diagnosis Difficulty walking   PT Problem List    PT Treatment Interventions     PT Goals (Current goals can be found in the Care Plan section) Acute Rehab PT Goals PT Goal Formulation: All assessment and education complete, DC therapy    Frequency     Barriers to discharge        Co-evaluation               End of Session   Activity Tolerance: Patient tolerated treatment well Patient left: in chair;with call bell/phone within reach Nurse Communication: Mobility status         Time: [id number] PT Time Calculation (min) (ACUTE ONLY): 12 min   Charges:   PT Evaluation $Initial PT Evaluation Tier I: 1 Procedure     PT G Codes:        [name] 02/15/2015, 2:09 PM  [employer] PT [phone]

## 2015-02-15 NOTE — Progress Notes (Signed)
PROGRESS NOTE  [name]:[id number] DOB: 04-11-1951 DOA: 02/14/2015 PCP: [name], NP  Assessment/Plan: Hypercalcemia secondary to primary hyperparathyroidism  - Patient is asymptomatic , presented after she was called by her PCP for abnormal labs , no EKG changes . - received 1 L fluid bolus in ED, d/c IVF and lasix - Will start on Sensipar 30 mg twice a day  - per admitting dr, plan was discussed with nephrology Dr. Hyman Hopes, if no improvement with calcium level with current management, may need official nephrology consult. - PTH intact, PTH related peptide for baseline- first was 222- repeat was 48 - hold calcium supplements - Monitor on telemetry  -Continue to follow with endocrinology as an outpatient dr Talmage Nap.   Hyperlipidemia - Continue with statin  History of schizophrenia  - Continue with home medication   Glaucoma - Continue with home medication  Hypertension - Continue with home medication  History of hypokalemia - Potassium is currently on higher normal side, will hold supplement, but anticipates it will drop on diuresis, will monitor closely, and will resume potassium supplement if needed.   Code Status: full Family Communication: patient Disposition Plan: home in AM?   Consultants:    Procedures:    Antibiotics:    HPI/Subjective: Feeling well, no SOB  Objective: Filed Vitals:   02/15/15 1133  BP: 124/62  Pulse:   Temp:   Resp:     Intake/Output Summary (Last 24 hours) at 02/15/15 1427 Last data filed at 02/15/15 1103  Gross per 24 hour  Intake    360 ml  Output   4350 ml  Net  -3990 ml   Filed Weights   02/14/15 1422  Weight: 655.448 kg (1445 lb)    Exam:   General:  A+OX3, NAD  Cardiovascular: rrr  Respiratory: clear  Abdomen: +BS, soft  Musculoskeletal: no edema   Data Reviewed: Basic Metabolic Panel:  Recent Labs Lab 02/13/15 1029 02/14/15 1041 02/14/15 1610 02/15/15 0517  NA 138 136   --  139  K 5.1 4.9  --  4.2  CL 101 107  --  115*  CO2 20 20*  --  18*  GLUCOSE 88 136*  --  88  BUN 12 13  --  10  CREATININE 0.85 1.03* 0.88 0.81  CALCIUM 14.5* 14.2*  --  12.4*   Liver Function Tests:  Recent Labs Lab 02/14/15 1041 02/15/15 0517  AST 38 34  ALT 40 36  ALKPHOS 58 45  BILITOT 0.8 0.5  PROT 7.5 6.0*  ALBUMIN 4.3 3.4*   No results for input(s): LIPASE, AMYLASE in the last 168 hours. No results for input(s): AMMONIA in the last 168 hours. CBC:  Recent Labs Lab 02/14/15 1041 02/14/15 1610 02/15/15 0517  WBC 8.2 7.9 5.2  NEUTROABS 4.7  --   --   HGB 14.0 13.0 12.5  HCT 42.3 40.5 39.1  MCV 92.6 93.1 94.7  PLT 210 199 166   Cardiac Enzymes: No results for input(s): CKTOTAL, CKMB, CKMBINDEX, TROPONINI in the last 168 hours. BNP (last 3 results) No results for input(s): BNP in the last 8760 hours.  ProBNP (last 3 results) No results for input(s): PROBNP in the last 8760 hours.  CBG: No results for input(s): GLUCAP in the last 168 hours.  No results found for this or any previous visit (from the past 240 hour(s)).   Studies: No results found.  Scheduled Meds: . atenolol  50 mg Oral Daily  . atorvastatin  20 mg Oral Daily  . cinacalcet  30 mg Oral BID WC  . dorzolamide  1 drop Both Eyes TID  . enalapril  10 mg Oral Daily  . furosemide  20 mg Intravenous Q12H  . heparin  5,000 Units Subcutaneous 3 times per day  . latanoprost  1 drop Both Eyes QHS  . thioridazine  100 mg Oral QHS   Continuous Infusions: . sodium chloride     Antibiotics Given (last 72 hours)    None      Principal Problem:   Hyperlipidemia Active Problems:   Hyperparathyroidism, primary   Schizophrenia   Glaucoma   Hypercalcemia    Time spent: 25 min    Naseer Hearn  [facility] Pager [phone]. If 7PM-7AM, please contact night-coverage at [email or web address], password [email or web address] 02/15/2015, 2:27 PM  LOS: 1 day

## 2015-02-15 NOTE — Progress Notes (Signed)
Utilization review completed.  

## 2015-02-16 ENCOUNTER — Encounter (HOSPITAL_COMMUNITY)
Admission: EM | Disposition: A | Discharge status: Home / Self Care | Payer: Self-pay | Source: Home / Self Care | Attending: Emergency Medicine

## 2015-02-16 DIAGNOSIS — E210 Primary hyperparathyroidism: Secondary | ICD-10-CM | POA: Diagnosis not present

## 2015-02-16 DIAGNOSIS — E785 Hyperlipidemia, unspecified: Secondary | ICD-10-CM | POA: Diagnosis not present

## 2015-02-16 DIAGNOSIS — F209 Schizophrenia, unspecified: Secondary | ICD-10-CM | POA: Diagnosis not present

## 2015-02-16 LAB — CBC
HCT: 38 % (ref 36.0–46.0)
Hemoglobin: 12.1 g/dL (ref 12.0–15.0)
MCH: 29.4 pg (ref 26.0–34.0)
MCHC: 31.8 g/dL (ref 30.0–36.0)
MCV: 92.5 fL (ref 78.0–100.0)
PLATELETS: 178 10*3/uL (ref 150–400)
RBC: 4.11 MIL/uL (ref 3.87–5.11)
RDW: 13 % (ref 11.5–15.5)
WBC: 6.6 10*3/uL (ref 4.0–10.5)

## 2015-02-16 LAB — BASIC METABOLIC PANEL
ANION GAP: 8 (ref 5–15)
BUN: 9 mg/dL (ref 6–20)
CALCIUM: 12.8 mg/dL — AB (ref 8.9–10.3)
CO2: 20 mmol/L — ABNORMAL LOW (ref 22–32)
CREATININE: 1.06 mg/dL — AB (ref 0.44–1.00)
Chloride: 111 mmol/L (ref 101–111)
GFR calc Af Amer: >60 mL/min (ref >60)
GFR calc non Af Amer: 55 mL/min — ABNORMAL LOW (ref >60)
GLUCOSE: 108 mg/dL — AB (ref 65–99)
Potassium: 3.8 mmol/L (ref 3.5–5.1)
Sodium: 139 mmol/L (ref 135–145)

## 2015-02-16 SURGERY — RIGHT HEART CATH
Anesthesia: LOCAL

## 2015-02-16 MED ORDER — CINACALCET HCL 30 MG PO TABS: 30.0000 mg | ORAL_TABLET | Freq: Two times a day (BID) | ORAL | Status: DC

## 2015-02-16 MED ORDER — THIORIDAZINE HCL 50 MG PO TABS: 50.0000 mg | ORAL_TABLET | Freq: Every day | ORAL | Status: DC

## 2015-02-16 NOTE — Progress Notes (Signed)
Pt received discharge instruction and two Rx's, pt verbalized understanding instruction, med list and follow up appt. IV dc'd, vitals stable. Waiting on family for ride.  02/16/2015 6:40 PM Josip Merolla

## 2015-02-16 NOTE — Discharge Summary (Addendum)
Physician Discharge Summary  [name] XLK:[id number] DOB: [birth year] DOA: 02/14/2015  PCP: Sharon SellerEUBANKS, JESSICA K, NP  Admit date: 02/14/2015 Discharge date: 02/16/2015  Time spent: 35 minutes  Recommendations for Outpatient Follow-up:  1. Calcium in 1 week 2. Needs to follow with Dr. Talmage NapBalan (endo) 1-2 weeks 3. Instructed to not take calcium supplements 4. Called sister to discuss patient but no answer- would think a family member would need to be involved as patient does not appear to have a good grasp of her diseases and treatment plan  Discharge Diagnoses:  Principal Problem:   Hyperlipidemia Active Problems:   Hyperparathyroidism, primary   Schizophrenia   Glaucoma   Hypercalcemia   Discharge Condition: improved  Diet recommendation: cardiac  Filed Weights   02/14/15 1422 02/15/15 1443  Weight: 655.448 kg (1445 lb) 66.588 kg (146 lb 12.8 oz)    History of present illness:  [name] is a 64 y.o. female, with past medical history of primary hyperparathyroidism, hypertension, glaucoma, osteoporosis, schizophrenia, vitamin D deficiency, patient was sent by her PCP for hypercalcemia ,patient with knownHistory of primary hyperparathyroidism, following with Dr. Talmage NapBalan, from endocrinology, patient reports there are no plans for surgery as of recently, patient has been taking Fosamax, and Zometa infusions (reports most recent infusion was in earlier this month her hypercalcemia, baseline calcium is 11.5, [facility] 14.5, so they called her and requested her to come to ED today, patient denies any bone pain, fatigue, paresthesias, cramps, only endorses constipation, patient started on IV fluids in ED, and hospitalist service requested to admit, patient reports she is on calcium supplement, even though it could not be verified by her medication list, As well reports she was recently started on potassium supplement given her hypokalemia .  Hospital Course:   Hypercalcemia secondary to primary hyperparathyroidism  - Patient is asymptomatic  - s/p IVF and lasix. - Will start on Sensipar 30 mg twice a day - discussed with Dr. Hyman HopesWebb - hold calcium supplements - Monitor on telemetry  -Continue to follow with endocrinology as an outpatient dr Talmage NapBalan.   Hyperlipidemia - Continue with statin  History of schizophrenia  - Continue with home medication   Glaucoma - Continue with home medication  Hypertension - Continue with home medication  History of hypokalemia - Potassium is currently on higher normal side, will hold supplement -recheck labs   Procedures:    Consultations:  Renal (phone)  Discharge Exam: Filed Vitals:   02/16/15 0520  BP: 119/58  Pulse: 88  Temp: 98.2 F (36.8 C)  Resp: 20    General: pleasant/cooperative Cardiovascular: rrr Respiratory: clear  Discharge Instructions   Discharge Instructions    Diet - low sodium heart healthy    Complete by:  As directed      Discharge instructions    Complete by:  As directed   Needs to follow up with Dr. Talmage NapBalan with in 1-2 weeks Calcium lab draw in 1 week     Increase activity slowly    Complete by:  As directed           Current Discharge Medication List    START taking these medications   Details  cinacalcet (SENSIPAR) 30 MG tablet Take 1 tablet (30 mg total) by mouth 2 (two) times daily with a meal. Qty: 60 tablet, Refills: 0      CONTINUE these medications which have NOT CHANGED   Details  atenolol (TENORMIN) 50 MG tablet TAKE 1 TABLET BY MOUTH  EVERY DAY Qty: 30 tablet, Refills: 5    atorvastatin (LIPITOR) 20 MG tablet Take 1 tablet (20 mg total) by mouth daily. Qty: 90 tablet, Refills: 3    dorzolamide (TRUSOPT) 2 % ophthalmic solution Place 1 drop into both eyes 3 (three) times daily.    enalapril (VASOTEC) 10 MG tablet TAKE 1 TABLET BY MOUTH DAILY Qty: 30 tablet, Refills: 5    latanoprost (XALATAN) 0.005 % ophthalmic solution Place 1  drop into both eyes at bedtime.    thioridazine (MELLARIL) 50 MG tablet Take 50 mg by mouth. Take 2 tablets at bedtime.      STOP taking these medications     alendronate (FOSAMAX) 70 MG tablet      potassium chloride 20 MEQ TBCR        No Known Allergies    The results of significant diagnostics from this hospitalization (including imaging, microbiology, ancillary and laboratory) are listed below for reference.    Significant Diagnostic Studies: No results found.  Microbiology: No results found for this or any previous visit (from the past 240 hour(s)).   Labs: Basic Metabolic Panel:  Recent Labs Lab 02/13/15 1029 02/14/15 1041 02/14/15 1610 02/15/15 0517 02/16/15 0639  NA 138 136  --  139 139  K 5.1 4.9  --  4.2 3.8  CL 101 107  --  115* 111  CO2 20 20*  --  18* 20*  GLUCOSE 88 136*  --  88 108*  BUN 12 13  --  10 9  CREATININE 0.85 1.03* 0.88 0.81 1.06*  CALCIUM 14.5* 14.2*  --  12.4* 12.8*   Liver Function Tests:  Recent Labs Lab 02/14/15 1041 02/15/15 0517  AST 38 34  ALT 40 36  ALKPHOS 58 45  BILITOT 0.8 0.5  PROT 7.5 6.0*  ALBUMIN 4.3 3.4*   No results for input(s): LIPASE, AMYLASE in the last 168 hours. No results for input(s): AMMONIA in the last 168 hours. CBC:  Recent Labs Lab 02/14/15 1041 02/14/15 1610 02/15/15 0517 02/16/15 0639  WBC 8.2 7.9 5.2 6.6  NEUTROABS 4.7  --   --   --   HGB 14.0 13.0 12.5 12.1  HCT 42.3 40.5 39.1 38.0  MCV 92.6 93.1 94.7 92.5  PLT 210 199 166 178   Cardiac Enzymes: No results for input(s): CKTOTAL, CKMB, CKMBINDEX, TROPONINI in the last 168 hours. BNP: BNP (last 3 results) No results for input(s): BNP in the last 8760 hours.  ProBNP (last 3 results) No results for input(s): PROBNP in the last 8760 hours.  CBG: No results for input(s): GLUCAP in the last 168 hours.     SignedMarlin Canary  [facility] 02/16/2015, 3:23 PM

## 2015-02-16 NOTE — Progress Notes (Signed)
NCM awaiting benefit check on sensipar 30mg  bid.

## 2015-02-16 NOTE — Progress Notes (Signed)
Patient Demographics     Patient Name Sex DOB SSN Address    [name], Betty C Female 1951/02/19 [id number] [place] [id number]    Phone: [phone] (Home) *Preferred*      Message  Received: Today    Betty NatterAndra M Robinson  [name] [name], RN            02/16/2015 Called [facility] at [phone]. Talked to CSR Laren Boomiffany G. SENSIPAR is covered as a TIER 3 Medication. No Prior Authorization required. Patient will have a retail pharmacy co-payment of $45.00 for a 30 Day Supply and $125.00 for a 90 Day Supply.    [facility].       Previous Messages

## 2015-02-16 NOTE — Progress Notes (Signed)
Pt discharging home, vitals stable, ambulates steady gait,IV dc'd. Discharging education reviewed with pt.

## 2015-02-16 NOTE — Progress Notes (Signed)
Pt is discharged to home with family members. All personal belongings  sent with pt.

## 2015-02-17 LAB — CALCIUM, IONIZED: CALCIUM, IONIZED, SERUM: 7.2 mg/dL — AB (ref 4.5–5.6)

## 2015-02-17 LAB — PARATHYROID HORMONE, INTACT (NO CA): PTH: 218 pg/mL — ABNORMAL HIGH (ref 15–65)

## 2015-02-17 NOTE — Progress Notes (Signed)
PT Note Late G Code Entry    02/15/15 1410  PT G-Codes **NOT FOR INPATIENT CLASS**  Functional Assessment Tool Used clinical judgement  Functional Limitation Mobility: Walking and moving around  Mobility: Walking and Moving Around Current Status [phone](G8978) [facility]  Mobility: Walking and Moving Around Goal Status [phone](G8979) [facility]  Mobility: Walking and Moving Around Discharge Status [phone](G8980) [facility]    [name] PT [phone]

## 2015-02-21 ENCOUNTER — Encounter: Payer: Self-pay | Admitting: *Deleted

## 2015-02-22 ENCOUNTER — Ambulatory Visit (INDEPENDENT_AMBULATORY_CARE_PROVIDER_SITE_OTHER): Payer: Medicare Other | Admitting: Nurse Practitioner

## 2015-02-22 ENCOUNTER — Encounter: Payer: Self-pay | Admitting: Nurse Practitioner

## 2015-02-22 VITALS — BP 100/64 | HR 75 | Temp 97.8°F | Resp 20 | Ht 64.0 in | Wt 145.2 lb

## 2015-02-22 DIAGNOSIS — I10 Essential (primary) hypertension: Secondary | ICD-10-CM

## 2015-02-22 DIAGNOSIS — E876 Hypokalemia: Secondary | ICD-10-CM

## 2015-02-22 DIAGNOSIS — F209 Schizophrenia, unspecified: Secondary | ICD-10-CM | POA: Diagnosis not present

## 2015-02-22 DIAGNOSIS — E210 Primary hyperparathyroidism: Secondary | ICD-10-CM

## 2015-02-22 LAB — PTH-RELATED PEPTIDE

## 2015-02-22 NOTE — Progress Notes (Signed)
Patient ID: [name], female   DOB: [birth year], 64 y.o.   MRN: [id number]    PCP: Sharon SellerEUBANKS, Anastasia Tompson K, NP  No Known Allergies  Chief Complaint  Patient presents with  . Hospitalization Follow-up    Hospital follow-up    HPI: Patient is a 64 y.o. female seen in the office today for hospital follow up. Pt with past medical history of primary hyperparathyroidism, hypertension, glaucoma, osteoporosis, schizophrenia, vitamin D deficiency, who was hospitalized after critical calcium level of 14.5, pt was started on Sensipar 30 mg twice a day. Pt also was followed for hypokalemia but potassium was actually higher normal so potassium was being held.  Pt denies taking calcium supplements today.  Taking Sensipar twice daily  Pt is still taking potassium supplements even though she was advised not to in the hospital.  Been doing well since she has been home.  Plans to follow up with Dr Lisabeth DevoidBallan next week to discuss plans for her hyperparathyroid, ?discussion of surgery  Review of Systems:  Review of Systems  Constitutional: Negative for fever, chills, appetite change and unexpected weight change.  Eyes: Negative for visual disturbance.  Respiratory: Negative for cough, chest tightness and shortness of breath.   Cardiovascular: Negative for chest pain, palpitations and leg swelling.  Gastrointestinal: Negative for nausea, abdominal pain, diarrhea, constipation and abdominal distention.  Skin: Negative for pallor and rash.  Neurological: Negative for dizziness, light-headedness and headaches.  Psychiatric/Behavioral: Negative for suicidal ideas and sleep disturbance.    Past Medical History  Diagnosis Date  . Hyperparathyroidism   . Schizophrenia   . Hypertension   . Osteopenia   . Vitamin D deficiency   . Cough   . Other abnormal blood chemistry   . Chlamydia trachomatis infection of unspecified genitourinary site 12/18/2011  . Other and unspecified hyperlipidemia   . Disorder of  bone and cartilage, unspecified   . Edema   . Tobacco use disorder   . Tachycardia, unspecified 05/29/2010  . Unspecified glaucoma   . Hypercalcemia 02/14/2015   Past Surgical History  Procedure Laterality Date  . No past surgeries     Social History:   reports that she quit smoking about 2 years ago. She has never used smokeless tobacco. She reports that she drinks alcohol. She reports that she does not use illicit drugs.  Family History  Problem Relation Age of Onset  . Hypertension Other   . Diabetes Other   . Cancer Mother   . Cancer Brother     Throat    Medications: Patient's Medications  New Prescriptions   No medications on file  Previous Medications   ATENOLOL (TENORMIN) 50 MG TABLET    TAKE 1 TABLET BY MOUTH EVERY DAY   ATORVASTATIN (LIPITOR) 20 MG TABLET    Take 1 tablet (20 mg total) by mouth daily.   CINACALCET (SENSIPAR) 30 MG TABLET    Take 1 tablet (30 mg total) by mouth 2 (two) times daily with a meal.   DORZOLAMIDE (TRUSOPT) 2 % OPHTHALMIC SOLUTION    Place 1 drop into both eyes 3 (three) times daily.   ENALAPRIL (VASOTEC) 10 MG TABLET    TAKE 1 TABLET BY MOUTH DAILY   LATANOPROST (XALATAN) 0.005 % OPHTHALMIC SOLUTION    Place 1 drop into both eyes at bedtime.   THIORIDAZINE (MELLARIL) 50 MG TABLET    Take 1 tablet (50 mg total) by mouth at bedtime. Take 2 tablets at bedtime.  Modified Medications   No medications  on file  Discontinued Medications   No medications on file     Physical Exam:  Filed Vitals:   02/22/15 0946  BP: 100/64  Pulse: 75  Temp: 97.8 F (36.6 C)  TempSrc: Oral  Resp: 20  Height:  (1.626 m)  Weight: 145 lb 3.2 oz (65.862 kg)  SpO2: 99%    Physical Exam  Constitutional: She is oriented to person, place, and time. She appears well-developed and well-nourished. No distress.  Neck: Normal range of motion. Neck supple.  Cardiovascular: Normal rate, regular rhythm and normal heart sounds.   Pulmonary/Chest: Effort  normal and breath sounds normal.  Abdominal: Soft. Bowel sounds are normal.  Musculoskeletal: She exhibits no edema or tenderness.  Neurological: She is alert and oriented to person, place, and time.  Skin: Skin is warm and dry. She is not diaphoretic.  Psychiatric: She has a normal mood and affect.    Labs reviewed: Basic Metabolic Panel:  Recent Labs  40/98/11 1041 02/14/15 1610 02/15/15 0517 02/16/15 0639  NA 136  --  139 139  K 4.9  --  4.2 3.8  CL 107  --  115* 111  CO2 20*  --  18* 20*  GLUCOSE 136*  --  88 108*  BUN 13  --  10 9  CREATININE 1.03* 0.88 0.81 1.06*  CALCIUM 14.2*  --  12.4* 12.8*   Liver Function Tests:  Recent Labs  01/25/15 1210 02/14/15 1041 02/15/15 0517  AST 40 38 34  ALT 45* 40 36  ALKPHOS 64 58 45  BILITOT 0.5 0.8 0.5  PROT 6.5 7.5 6.0*  ALBUMIN  --  4.3 3.4*   No results for input(s): LIPASE, AMYLASE in the last 8760 hours. No results for input(s): AMMONIA in the last 8760 hours. CBC:  Recent Labs  02/14/15 1041 02/14/15 1610 02/15/15 0517 02/16/15 0639  WBC 8.2 7.9 5.2 6.6  NEUTROABS 4.7  --   --   --   HGB 14.0 13.0 12.5 12.1  HCT 42.3 40.5 39.1 38.0  MCV 92.6 93.1 94.7 92.5  PLT 210 199 166 178   Lipid Panel:  Recent Labs  07/18/14 1253  CHOL 145  HDL 41  LDLCALC 61  TRIG 216*  CHOLHDL 3.5   TSH: No results for input(s): TSH in the last 8760 hours. A1C: Lab Results  Component Value Date   HGBA1C 5.7* 07/18/2014     Assessment/Plan 1. Hyperparathyroidism, primary -following with Dr Talmage Nap, to make an appt for next week due to elevated calcium and recent hospitalization -follow up bmp for calcium today  - Basic metabolic panel  2. Hypokalemia -to stop all supplements, unless instructed otherwise after lab work -follow up bmp for potassium today - Basic metabolic panel  3. Hypercalcemia -will update labs, conts on sensipar 30 mg BID - Basic metabolic panel  4. Hypertension -blood pressure  controlled today, conts on enalapril and atenolol  5. Schizophrenia -mood is stable, cont current regimen

## 2015-02-22 NOTE — Patient Instructions (Signed)
STOP potassium supplement for now  Make appt with Dr Talmage NapBalan endocrinologist, have records sent to our office   Follow up here in 6 weeks

## 2015-02-23 ENCOUNTER — Telehealth: Payer: Self-pay | Admitting: *Deleted

## 2015-02-23 ENCOUNTER — Inpatient Hospital Stay (HOSPITAL_COMMUNITY)
Admission: EM | Admit: 2015-02-23 | Discharge: 2015-02-26 | DRG: 644 | Disposition: A | Discharge status: Home / Self Care | Payer: Medicare Other | Attending: Internal Medicine | Admitting: Internal Medicine

## 2015-02-23 ENCOUNTER — Encounter (HOSPITAL_COMMUNITY): Payer: Self-pay | Admitting: Emergency Medicine

## 2015-02-23 DIAGNOSIS — E210 Primary hyperparathyroidism: Secondary | ICD-10-CM | POA: Diagnosis not present

## 2015-02-23 DIAGNOSIS — R531 Weakness: Secondary | ICD-10-CM | POA: Diagnosis not present

## 2015-02-23 DIAGNOSIS — H409 Unspecified glaucoma: Secondary | ICD-10-CM | POA: Diagnosis not present

## 2015-02-23 DIAGNOSIS — N179 Acute kidney failure, unspecified: Secondary | ICD-10-CM | POA: Diagnosis not present

## 2015-02-23 DIAGNOSIS — K219 Gastro-esophageal reflux disease without esophagitis: Secondary | ICD-10-CM | POA: Diagnosis not present

## 2015-02-23 DIAGNOSIS — I10 Essential (primary) hypertension: Secondary | ICD-10-CM | POA: Diagnosis not present

## 2015-02-23 DIAGNOSIS — M25512 Pain in left shoulder: Secondary | ICD-10-CM | POA: Diagnosis not present

## 2015-02-23 DIAGNOSIS — Z9114 Patient's other noncompliance with medication regimen: Secondary | ICD-10-CM | POA: Diagnosis present

## 2015-02-23 DIAGNOSIS — F2089 Other schizophrenia: Secondary | ICD-10-CM

## 2015-02-23 DIAGNOSIS — E860 Dehydration: Secondary | ICD-10-CM | POA: Diagnosis not present

## 2015-02-23 DIAGNOSIS — F209 Schizophrenia, unspecified: Secondary | ICD-10-CM | POA: Diagnosis present

## 2015-02-23 DIAGNOSIS — F17210 Nicotine dependence, cigarettes, uncomplicated: Secondary | ICD-10-CM | POA: Diagnosis not present

## 2015-02-23 DIAGNOSIS — Z79899 Other long term (current) drug therapy: Secondary | ICD-10-CM | POA: Diagnosis not present

## 2015-02-23 DIAGNOSIS — R5383 Other fatigue: Secondary | ICD-10-CM | POA: Diagnosis not present

## 2015-02-23 DIAGNOSIS — E785 Hyperlipidemia, unspecified: Secondary | ICD-10-CM | POA: Diagnosis present

## 2015-02-23 HISTORY — DX: Hyperlipidemia, unspecified: E78.5

## 2015-02-23 LAB — BASIC METABOLIC PANEL
BUN / CREAT RATIO: 10 — AB (ref 11–26)
BUN: 14 mg/dL (ref 8–27)
CHLORIDE: 106 mmol/L (ref 97–108)
CO2: 18 mmol/L (ref 18–29)
Calcium: 13.6 mg/dL (ref 8.7–10.3)
Creatinine, Ser: 1.36 mg/dL — ABNORMAL HIGH (ref 0.57–1.00)
GFR calc non Af Amer: 42 mL/min/{1.73_m2} — ABNORMAL LOW (ref >59)
GFR, EST AFRICAN AMERICAN: 48 mL/min/{1.73_m2} — AB (ref >59)
Glucose: 132 mg/dL — ABNORMAL HIGH (ref 65–99)
Potassium: 5 mmol/L (ref 3.5–5.2)
SODIUM: 136 mmol/L (ref 134–144)

## 2015-02-23 LAB — MAGNESIUM
Magnesium: 1 mg/dL — ABNORMAL LOW (ref 1.7–2.4)
Magnesium: 1 mg/dL — ABNORMAL LOW (ref 1.7–2.4)

## 2015-02-23 LAB — COMPREHENSIVE METABOLIC PANEL
ALBUMIN: 4.3 g/dL (ref 3.5–5.0)
ALT: 29 U/L (ref 14–54)
AST: 31 U/L (ref 15–41)
Alkaline Phosphatase: 54 U/L (ref 38–126)
Anion gap: 8 (ref 5–15)
BUN: 8 mg/dL (ref 6–20)
CHLORIDE: 111 mmol/L (ref 101–111)
CO2: 18 mmol/L — AB (ref 22–32)
CREATININE: 1.33 mg/dL — AB (ref 0.44–1.00)
Calcium: 14 mg/dL (ref 8.9–10.3)
GFR calc non Af Amer: 42 mL/min — ABNORMAL LOW (ref >60)
GFR, EST AFRICAN AMERICAN: 49 mL/min — AB (ref >60)
GLUCOSE: 117 mg/dL — AB (ref 65–99)
Potassium: 4.9 mmol/L (ref 3.5–5.1)
Sodium: 137 mmol/L (ref 135–145)
TOTAL PROTEIN: 7.8 g/dL (ref 6.5–8.1)
Total Bilirubin: 0.5 mg/dL (ref 0.3–1.2)

## 2015-02-23 LAB — CBC WITH DIFFERENTIAL/PLATELET
Basophils Absolute: 0 10*3/uL (ref 0.0–0.1)
Basophils Relative: 0 % (ref 0–1)
Eosinophils Absolute: 0.1 10*3/uL (ref 0.0–0.7)
Eosinophils Relative: 1 % (ref 0–5)
HCT: 40.9 % (ref 36.0–46.0)
Hemoglobin: 13.2 g/dL (ref 12.0–15.0)
Lymphocytes Relative: 32 % (ref 12–46)
Lymphs Abs: 2.3 10*3/uL (ref 0.7–4.0)
MCH: 29.9 pg (ref 26.0–34.0)
MCHC: 32.3 g/dL (ref 30.0–36.0)
MCV: 92.7 fL (ref 78.0–100.0)
Monocytes Absolute: 0.7 10*3/uL (ref 0.1–1.0)
Monocytes Relative: 10 % (ref 3–12)
Neutro Abs: 4 10*3/uL (ref 1.7–7.7)
Neutrophils Relative %: 57 % (ref 43–77)
Platelets: 201 10*3/uL (ref 150–400)
RBC: 4.41 MIL/uL (ref 3.87–5.11)
RDW: 12.5 % (ref 11.5–15.5)
WBC: 7 10*3/uL (ref 4.0–10.5)

## 2015-02-23 LAB — I-STAT TROPONIN, ED: Troponin i, poc: 0 ng/mL (ref 0.00–0.08)

## 2015-02-23 LAB — PHOSPHORUS: PHOSPHORUS: 2.7 mg/dL (ref 2.5–4.6)

## 2015-02-23 MED ORDER — ATORVASTATIN CALCIUM 20 MG PO TABS
20.0000 mg | ORAL_TABLET | Freq: Every day | ORAL | Status: DC
  Administered 2015-02-23 – 2015-02-25 (×3): 20 mg via ORAL
  Filled 2015-02-23 (×4): qty 1

## 2015-02-23 MED ORDER — SODIUM CHLORIDE 0.9 % IV BOLUS (SEPSIS)
1000.0000 mL | Freq: Once | INTRAVENOUS | Status: AC
  Administered 2015-02-23: 1000 mL via INTRAVENOUS

## 2015-02-23 MED ORDER — SODIUM CHLORIDE 0.9 % IV BOLUS (SEPSIS): 1000.0000 mL | Freq: Once | INTRAVENOUS | Status: DC

## 2015-02-23 MED ORDER — MAGNESIUM SULFATE 2 GM/50ML IV SOLN
2.0000 g | Freq: Once | INTRAVENOUS | Status: AC
  Administered 2015-02-23: 2 g via INTRAVENOUS
  Filled 2015-02-23: qty 50

## 2015-02-23 MED ORDER — ACETAMINOPHEN 325 MG PO TABS: 650.0000 mg | ORAL_TABLET | Freq: Four times a day (QID) | ORAL | Status: DC | PRN

## 2015-02-23 MED ORDER — ACETAMINOPHEN 650 MG RE SUPP: 650.0000 mg | Freq: Four times a day (QID) | RECTAL | Status: DC | PRN

## 2015-02-23 MED ORDER — LATANOPROST 0.005 % OP SOLN
1.0000 [drp] | Freq: Every day | OPHTHALMIC | Status: DC
  Administered 2015-02-23 – 2015-02-25 (×3): 1 [drp] via OPHTHALMIC
  Filled 2015-02-23: qty 2.5

## 2015-02-23 MED ORDER — DORZOLAMIDE HCL 2 % OP SOLN
1.0000 [drp] | Freq: Three times a day (TID) | OPHTHALMIC | Status: DC
  Administered 2015-02-23 – 2015-02-26 (×8): 1 [drp] via OPHTHALMIC
  Filled 2015-02-23: qty 10

## 2015-02-23 MED ORDER — ATENOLOL 50 MG PO TABS
50.0000 mg | ORAL_TABLET | Freq: Every day | ORAL | Status: DC
Start: 2015-02-23 — End: 2015-02-26
  Administered 2015-02-23 – 2015-02-26 (×4): 50 mg via ORAL
  Filled 2015-02-23 (×4): qty 1

## 2015-02-23 MED ORDER — PANTOPRAZOLE SODIUM 40 MG PO TBEC
40.0000 mg | DELAYED_RELEASE_TABLET | Freq: Every day | ORAL | Status: DC
  Administered 2015-02-24 – 2015-02-25 (×2): 40 mg via ORAL
  Filled 2015-02-23 (×2): qty 1

## 2015-02-23 MED ORDER — CINACALCET HCL 30 MG PO TABS
30.0000 mg | ORAL_TABLET | Freq: Two times a day (BID) | ORAL | Status: DC
  Administered 2015-02-23 – 2015-02-25 (×5): 30 mg via ORAL
  Filled 2015-02-23 (×8): qty 1

## 2015-02-23 MED ORDER — SODIUM CHLORIDE 0.9 % IV SOLN
90.0000 mg | Freq: Once | INTRAVENOUS | Status: AC
  Administered 2015-02-23: 90 mg via INTRAVENOUS
  Filled 2015-02-23: qty 10

## 2015-02-23 MED ORDER — ONDANSETRON HCL 4 MG PO TABS: 4.0000 mg | ORAL_TABLET | Freq: Four times a day (QID) | ORAL | Status: DC | PRN

## 2015-02-23 MED ORDER — HEPARIN SODIUM (PORCINE) 5000 UNIT/ML IJ SOLN
5000.0000 [IU] | Freq: Three times a day (TID) | INTRAMUSCULAR | Status: DC
  Administered 2015-02-24 – 2015-02-26 (×7): 5000 [IU] via SUBCUTANEOUS
  Filled 2015-02-23 (×10): qty 1

## 2015-02-23 MED ORDER — SODIUM CHLORIDE 0.9 % IV SOLN
INTRAVENOUS | Status: DC
  Administered 2015-02-23 – 2015-02-24 (×3): via INTRAVENOUS

## 2015-02-23 MED ORDER — THIORIDAZINE HCL 50 MG PO TABS
100.0000 mg | ORAL_TABLET | Freq: Every day | ORAL | Status: DC
  Administered 2015-02-23 – 2015-02-25 (×3): 100 mg via ORAL
  Filled 2015-02-23 (×4): qty 2

## 2015-02-23 MED ORDER — ONDANSETRON HCL 4 MG/2ML IJ SOLN: 4.0000 mg | Freq: Four times a day (QID) | INTRAMUSCULAR | Status: DC | PRN

## 2015-02-23 NOTE — H&P (Signed)
[facility] History and Physical  [name] RUE:[id number] DOB: 1951/03/21 DOA: 02/23/2015  Referring physician: Dr. Anitra Lauth PCP: [name], NP   Chief Complaint: hypercalcemia, fatigue and weakness.  HPI: [name] is a 64 y.o. female with PMH significant for primary hyperparathyroidism, HTN, glaucoma, HLD and schizophrenia; who came to ED secondary to generalized weakness, fatigue and polyuria. Patient was seen by PCP and sent to ED due to elevated Calcium. She was recently admitted due to hypercalcemia and was found to have primary  hyperparathyroidism. There is concerns for non-compliance due to schizophrenia. Patient denies CP, nausea, vomiting, diarrhea, SOB, hematochezia, melena or any other complaints. [facility] called to admit patient for further evaluation and treatment.   Review of Systems:  Negative except as mentioned on HPI.  Past Medical History  Diagnosis Date  . Hyperparathyroidism   . Hypertension   . Osteopenia   . Vitamin D deficiency   . Cough   . Other abnormal blood chemistry   . Chlamydia trachomatis infection of unspecified genitourinary site 12/18/2011  . Disorder of bone and cartilage, unspecified   . Edema   . Tobacco use disorder   . Tachycardia, unspecified 05/29/2010  . Unspecified glaucoma   . Hypercalcemia 02/14/2015  . Hyperlipemia   . Schizophrenia     "little bit" (02/23/2015)   Past Surgical History  Procedure Laterality Date  . No past surgeries     Social History:  reports that she has been smoking Cigarettes.  She has a 5.64 pack-year smoking history. She has never used smokeless tobacco. She reports that she drinks alcohol. She reports that she does not use illicit drugs.  No Known Allergies  Family History  Problem Relation Age of Onset  . Hypertension Other   . Diabetes Other   . Cancer Mother   . Cancer Brother     Throat    Prior to Admission medications   Medication Sig Start Date End Date Taking?  Authorizing Provider  alendronate (FOSAMAX) 70 MG tablet Take 70 mg by mouth once a week. 01/03/15  Yes Historical Provider, MD  atenolol (TENORMIN) 50 MG tablet TAKE 1 TABLET BY MOUTH EVERY DAY 11/17/14  Yes Mahima Pandey, MD  atorvastatin (LIPITOR) 20 MG tablet Take 1 tablet (20 mg total) by mouth daily. 07/25/14  Yes Mahima Glade Lloyd, MD  cinacalcet (SENSIPAR) 30 MG tablet Take 1 tablet (30 mg total) by mouth 2 (two) times daily with a meal. 02/16/15  Yes Jessica U Vann, DO  dorzolamide (TRUSOPT) 2 % ophthalmic solution Place 1 drop into both eyes 3 (three) times daily.   Yes Historical Provider, MD  enalapril (VASOTEC) 10 MG tablet TAKE 1 TABLET BY MOUTH DAILY 01/24/15  Yes Kimber Relic, MD  latanoprost (XALATAN) 0.005 % ophthalmic solution Place 1 drop into both eyes at bedtime.   Yes Historical Provider, MD  Neomycin-Bacitracin-Polymyxin (NEOSPORIN ORIGINAL EX) Apply 1 application topically 4 (four) times daily as needed (For skin irritation).   Yes Historical Provider, MD  thioridazine (MELLARIL) 50 MG tablet Take 1 tablet (50 mg total) by mouth at bedtime. Take 2 tablets at bedtime. 02/16/15  Yes Joseph Art, DO   Physical Exam: Filed Vitals:   02/23/15 1613 02/23/15 1615 02/23/15 1630 02/23/15 1735  BP: 120/63 124/58 118/99 140/65  Pulse: 82 81 90 94  Temp:    98.2 F (36.8 C)  TempSrc:    Oral  Resp: Height:     (  1.575 m)  Weight:    65.8 kg (145 lb 1 oz)  SpO2: 100% 100% 100% 100%    Wt Readings from Last 3 Encounters:  02/23/15 65.8 kg (145 lb 1 oz)  02/22/15 65.862 kg (145 lb 3.2 oz)  02/15/15 66.588 kg (146 lb 12.8 oz)    General:  Appears calm and comfortable; no fever, no CP and no SOB. Patient is cooperative to examination and denies hallucinations. She is oriented X 3. Eyes: PERRL, normal lids, irises & conjunctiva, no icterus, no nystagmus ENT: grossly normal hearing, dry MM, no erythema, no exudates, no thrush Neck: no LAD, masses or thyromegaly, no  JVD Cardiovascular: tachycardia, no m/r/g. No LE edema. Respiratory: CTA bilaterally, no w/r/r. Normal respiratory effort. Abdomen: soft, nt, nd; positive BS Skin: no rash or induration seen on limited exam Musculoskeletal: grossly normal tone BUE/BLE Psychiatric: grossly normal mood and affect, speech fluent and appropriate Neurologic: grossly non-focal.          Labs on Admission:  Basic Metabolic Panel:  Recent Labs Lab 02/22/15 1027 02/23/15 1259  NA 136 137  K 5.0 4.9  CL 106 111  CO2 18 18*  GLUCOSE 132* 117*  BUN 14 8  CREATININE 1.36* 1.33*  CALCIUM 13.6* 14.0*  MG  --  1.0*   Liver Function Tests:  Recent Labs Lab 02/23/15 1259  AST 31  ALT 29  ALKPHOS 54  BILITOT 0.5  PROT 7.8  ALBUMIN 4.3   CBC:  Recent Labs Lab 02/23/15 1254  WBC 7.0  NEUTROABS 4.0  HGB 13.2  HCT 40.9  MCV 92.7  PLT 201    EKG:  No acute ischemic changes; normal QT, no specific ST changes and just mild sinus tachycardia.  Assessment/Plan 1-Hypercalcemia: patient presented with polyuria, dehydration and extreme fatigue -found to have calcium 14.0 and Cr 1.33 -admitted to telemetry -IV resuscitation initiated -received 1 dose of Aredia -resumption of sensipar -will check Mg and phosphorus -follow BMET in am  2-Hyperparathyroidism, primary: patient is supposed to follow with endocrinologist and be compliant with her medications -unfortunately, given her schizophrenia i doubt that she has ability for that compliance  3-Schizophrenia: currently oriented X 3 and denies hallucinations  -will continue mellaril  4-glaucoma: will continue xalatan and trusopt  5-GERD (gastroesophageal reflux disease): continue protonix  6-Essential hypertension: will continue home medication regimen, except lisinopril given AKI -patient started on heart healthy diet  7-AKI: due to continue use of lisinopril and dehydration from hypercalcemia -nephrotoxic agents on hold -IV's initiated  for resuscitation -will follow renal function  8-hypomagnasemia: will replete and follow electrolytes   Code Status: Full DVT Prophylaxis:heparin  Family Communication: no family at bedside Disposition Plan: inpatient, LOS > 2 midnights, telemetry bed  Time spent: 65 minutes  Vassie LollMadera, [name] [facility] Pager [phone]

## 2015-02-23 NOTE — ED Provider Notes (Signed)
CSN: [id number]     Arrival date & time 02/23/15  1207 History   First MD Initiated Contact with Patient 02/23/15 1344     Chief Complaint  Patient presents with  . Abnormal Lab     (Consider location/radiation/quality/duration/timing/severity/associated sxs/prior Treatment) HPI  Pt is a 64yo female with hx of hyperparathyroidism, schizophrenia, HTN, osteopenia, Vitamin D deficiency, and hypercalcemia, sent to ED by PCP for further evaluation and tx of hypercalcemia. Pt was admitted from 02/14/15-02/16/15 for same. Pt had repeat labs yesterday which indicated pt had a Calcium of 13.6.  Pt is a poor historian but does state she is fatigued and believes it is due to all the blood for labs that has had to be taken recently. Pt denies previous hx of anemia. Pt also reports intermittent Left shoulder pain that is worse with certain movements. Denies taking any pain medication for pain. Denies recent injury to Left shoulder. Pt states she is taking her medication as prescribed, however, previous medical records indicate pt may not be compliant.  She denies CP, SOB, abdominal pain, n/v/d.   Past Medical History  Diagnosis Date  . Hyperparathyroidism   . Schizophrenia   . Hypertension   . Osteopenia   . Vitamin D deficiency   . Cough   . Other abnormal blood chemistry   . Chlamydia trachomatis infection of unspecified genitourinary site 12/18/2011  . Other and unspecified hyperlipidemia   . Disorder of bone and cartilage, unspecified   . Edema   . Tobacco use disorder   . Tachycardia, unspecified 05/29/2010  . Unspecified glaucoma   . Hypercalcemia 02/14/2015   Past Surgical History  Procedure Laterality Date  . No past surgeries     Family History  Problem Relation Age of Onset  . Hypertension Other   . Diabetes Other   . Cancer Mother   . Cancer Brother     Throat   History  Substance Use Topics  . Smoking status: Former Smoker    Quit date: 09/22/2012  . Smokeless tobacco: Never  Used     Comment: Quite age 64   . Alcohol Use: Yes   OB History    No data available     Review of Systems  Constitutional: Positive for fatigue. Negative for fever, chills, diaphoresis and appetite change.  Respiratory: Negative for cough and shortness of breath.   Cardiovascular: Negative for chest pain, palpitations and leg swelling.  Gastrointestinal: Negative for nausea, vomiting, abdominal pain, diarrhea and constipation.  Musculoskeletal: Positive for arthralgias ( intermittent, Left shoulder). Negative for myalgias, neck pain and neck stiffness.  Neurological: Negative for dizziness, seizures, syncope, weakness, light-headedness, numbness and headaches.  All other systems reviewed and are negative.     Allergies  Review of patient's allergies indicates no known allergies.  Home Medications   Prior to Admission medications   Medication Sig Start Date End Date Taking? Authorizing Provider  alendronate (FOSAMAX) 70 MG tablet Take 70 mg by mouth once a week. 01/03/15  Yes Historical Provider, MD  atenolol (TENORMIN) 50 MG tablet TAKE 1 TABLET BY MOUTH EVERY DAY 11/17/14  Yes Mahima Pandey, MD  atorvastatin (LIPITOR) 20 MG tablet Take 1 tablet (20 mg total) by mouth daily. 07/25/14  Yes Mahima Glade LloydPandey, MD  cinacalcet (SENSIPAR) 30 MG tablet Take 1 tablet (30 mg total) by mouth 2 (two) times daily with a meal. 02/16/15  Yes Jessica U Vann, DO  dorzolamide (TRUSOPT) 2 % ophthalmic solution Place 1 drop into both eyes  3 (three) times daily.   Yes Historical Provider, MD  enalapril (VASOTEC) 10 MG tablet TAKE 1 TABLET BY MOUTH DAILY 01/24/15  Yes Kimber Relic, MD  latanoprost (XALATAN) 0.005 % ophthalmic solution Place 1 drop into both eyes at bedtime.   Yes Historical Provider, MD  Neomycin-Bacitracin-Polymyxin (NEOSPORIN ORIGINAL EX) Apply 1 application topically 4 (four) times daily as needed (For skin irritation).   Yes Historical Provider, MD  thioridazine (MELLARIL) 50 MG tablet  Take 1 tablet (50 mg total) by mouth at bedtime. Take 2 tablets at bedtime. 02/16/15  Yes Jessica U Vann, DO   BP 118/99 mmHg  Pulse 90  Temp(Src) 98.7 F (37.1 C) (Oral)  Resp 16  SpO2 100% Physical Exam  Constitutional: She is oriented to person, place, and time. She appears well-developed and well-nourished. No distress.  Pt sitting in exam bed, NAD  HENT:  Head: Normocephalic and atraumatic.  Eyes: Conjunctivae and EOM are normal. Pupils are equal, round, and reactive to light. No scleral icterus.  Neck: Normal range of motion. Neck supple.  Cardiovascular: Regular rhythm and normal heart sounds.  Tachycardia present.   Regular rhythm  Pulmonary/Chest: Effort normal and breath sounds normal. No respiratory distress. She has no wheezes. She has no rales. She exhibits no tenderness.  Abdominal: Soft. Bowel sounds are normal. She exhibits no distension and no mass. There is no tenderness. There is no rebound and no guarding.  Musculoskeletal: Normal range of motion. She exhibits tenderness. She exhibits no edema.  Left shoulder: no obvious deformity or edema. Mild tenderness to [facility] joint. FROM 5/5 grip strength bilaterally.   Neurological: She is alert and oriented to person, place, and time. She has normal strength. No cranial nerve deficit or sensory deficit. Coordination normal. GCS eye subscore is 4. GCS verbal subscore is 5. GCS motor subscore is 6.  Skin: Skin is warm and dry. No rash noted. She is not diaphoretic. No erythema.  Nursing note and vitals reviewed.   ED Course  Procedures (including critical care time) Labs Review Labs Reviewed  COMPREHENSIVE METABOLIC PANEL - Abnormal; Notable for the following:    CO2 18 (*)    Glucose, Bld 117 (*)    Creatinine, Ser 1.33 (*)    Calcium 14.0 (*)    GFR calc non Af Amer 42 (*)    GFR calc Af Amer 49 (*)    All other components within normal limits  MAGNESIUM - Abnormal; Notable for the following:    Magnesium 1.0 (*)     All other components within normal limits  CBC WITH DIFFERENTIAL/PLATELET  I-STAT TROPOININ, ED    Imaging Review No results found.   EKG Interpretation   Date/Time:  Friday February 23 2015 12:16:46 EDT Ventricular Rate:  122 PR Interval:  146 QRS Duration: 76 QT Interval:  300 QTC Calculation: 427 R Axis:   50 Text Interpretation:  Sinus tachycardia new  Nonspecific T wave  abnormality Confirmed by Anitra Lauth  MD, Alphonzo Lemmings ([id number]) on 02/23/2015 4:07:13  PM      MDM   Final diagnoses:  Hypercalcemia    Pt is  63yo female sent to ED by PCP: Jonelle Sidle from [facility] for elevated Calcium.  Pt was admitted 02/14/15-02/16/15 for hypercalcemia.  Calcium at that time was 14.5 ten days ago.  Yesterday, Calcium was 13.6, repeat today is 14.0   IV fluids started.   2:58 PM Magnesium lab still pending.  Pt c/o generalized  fatigue, pt states "i think you guys are taking too much blood" pt also c/o intermittent Left shoulder pain that is worse with movement.  Shoulder pain appears musculoskeletal.  Discussed pt with Dr. Anitra Lauth, will admit to [facility] for hypercalcemia.   Consulted with Dr. Gwenlyn Perking, [facility], agreed to have pt admitted to a tele bed. Temporary admit orders placed.        Junius Finner, PA-C 02/23/15 1703  Gwyneth Sprout, MD 02/23/15 2136

## 2015-02-23 NOTE — Telephone Encounter (Signed)
Called patient regarding elevated calcium level of greater than 13.6, patient stated that she has a lot to do today and will get there sometime today. I informed the patient that it was very important that she get to the hospital, she stated that they will probably try to keep me for a couple of days. I called Shanda BumpsJessica to let her know what the patient had said, and she decided that it would good for her to call her and speak with her. [facility] she would be coming over, spoke with Candise BowensJen.

## 2015-02-23 NOTE — ED Notes (Signed)
States Dr's office called her and sent her here with low calcium. Pt was recently discharged last Friday for same

## 2015-02-23 NOTE — ED Notes (Signed)
Facility sending pt over for Ca > 13.

## 2015-02-24 DIAGNOSIS — I10 Essential (primary) hypertension: Secondary | ICD-10-CM

## 2015-02-24 DIAGNOSIS — N179 Acute kidney failure, unspecified: Secondary | ICD-10-CM

## 2015-02-24 LAB — BASIC METABOLIC PANEL
Anion gap: 8 (ref 5–15)
BUN: 9 mg/dL (ref 6–20)
CALCIUM: 12.7 mg/dL — AB (ref 8.9–10.3)
CO2: 19 mmol/L — AB (ref 22–32)
Chloride: 114 mmol/L — ABNORMAL HIGH (ref 101–111)
Creatinine, Ser: 1.05 mg/dL — ABNORMAL HIGH (ref 0.44–1.00)
GFR calc Af Amer: >60 mL/min (ref >60)
GFR calc non Af Amer: 56 mL/min — ABNORMAL LOW (ref >60)
Glucose, Bld: 75 mg/dL (ref 65–99)
POTASSIUM: 4.4 mmol/L (ref 3.5–5.1)
Sodium: 141 mmol/L (ref 135–145)

## 2015-02-24 LAB — MAGNESIUM: Magnesium: 1.5 mg/dL — ABNORMAL LOW (ref 1.7–2.4)

## 2015-02-24 MED ORDER — CALCITONIN (SALMON) 200 UNIT/ACT NA SOLN
1.0000 | Freq: Every day | NASAL | Status: DC
  Administered 2015-02-24 – 2015-02-26 (×3): 1 via NASAL
  Filled 2015-02-24: qty 3.7

## 2015-02-24 MED ORDER — FUROSEMIDE 10 MG/ML IJ SOLN
20.0000 mg | Freq: Once | INTRAMUSCULAR | Status: AC
  Administered 2015-02-24: 20 mg via INTRAVENOUS
  Filled 2015-02-24: qty 2

## 2015-02-24 MED ORDER — MAGNESIUM SULFATE 2 GM/50ML IV SOLN
2.0000 g | Freq: Once | INTRAVENOUS | Status: AC
  Administered 2015-02-24: 2 g via INTRAVENOUS
  Filled 2015-02-24: qty 50

## 2015-02-24 NOTE — Progress Notes (Signed)
PROGRESS NOTE  [name] WUJ:[id number] DOB: [birth year] DOA: 02/23/2015 PCP: Betty Seller, NP  HPI: [name] is a 64 y.o. female with PMH significant for primary hyperparathyroidism, HTN, glaucoma, HLD and schizophrenia; who came to ED secondary to generalized weakness, fatigue and polyuria. Patient was seen by PCP and sent to ED due to elevated Calcium. She was recently admitted due to hypercalcemia and was found to have primary hyperparathyroidism. There is concerns for non-compliance due to schizophrenia. Patient denies CP, nausea, vomiting, diarrhea, SOB, hematochezia, melena or any other complaints. [facility] called to admit patient for further evaluation and treatment.  Subjective / 24 H Interval events - doing well, asking when she can go home - no chest pain, shortness of breath, no abdominal pain, nausea or vomiting.   Assessment/Plan: Principal Problem:   Hypercalcemia Active Problems:   Hyperparathyroidism, primary   Schizophrenia   GERD (gastroesophageal reflux disease)   Essential hypertension   AKI (acute kidney injury)   Hypercalcemia - patient presented with polyuria, dehydration and extreme fatigue - found to have calcium 14.0 and Cr 1.33 - admitted to telemetry - IV resuscitation initiated, 20 mg IV Lasix today  - received 1 dose of Aredia - resumption of sensipar - Ca improving today still a bit elevated, calcitonin today   Hyperparathyroidism, primary - might need exploratory parathyroid surgery, defer to Dr. Talmage [name] this decision  Schizophrenia:  - currently oriented X 3 and denies hallucinations  - will continue mellaril  Glaucoma:  - will continue xalatan and trusopt  GERD (gastroesophageal reflux disease):  - continue protonix  Essential hypertension:  - will continue home medication regimen, except lisinopril given AKI - patient started on heart healthy diet  AKI:  - due to continue use of lisinopril and dehydration from  hypercalcemia - nephrotoxic agents on hold - IV's initiated for resuscitation - will follow renal function  Hypomagnasemia:  - will replete and follow electrolytes   Diet: Diet Heart Room service appropriate?: Yes; Fluid consistency:: Thin Fluids: NS DVT Prophylaxis: heparin  Code Status: Full Code Family Communication: no family bedside  Disposition Plan: home when ready   Consultants:  None   Procedures:  None     Antibiotics  Anti-infectives    None       Studies  No results found.  Objective  Filed Vitals:   02/23/15 1735 02/23/15 1950 02/24/15 0512 02/24/15 1030  BP: 140/65 122/63 116/62 122/58  Pulse: 94 89 73 79  Temp: 98.2 F (36.8 C) 98.2 F (36.8 C) 98.9 F (37.2 C)   TempSrc: Oral Oral Oral   Resp: Height:  (1.575 m)     Weight: 65.8 kg (145 lb 1 oz)     SpO2: 100% 100% 94%     Intake/Output Summary (Last 24 hours) at 02/24/15 1137 Last data filed at 02/24/15 1055  Gross per 24 hour  Intake    480 ml  Output   1250 ml  Net   -770 ml   Filed Weights   02/23/15 1735  Weight: 65.8 kg (145 lb 1 oz)    Exam:  General:  NAD  HEENT: no scleral icterus, PERRL  Cardiovascular: RRR without MRG, 2+ peripheral pulses, no edema  Respiratory: CTA biL, good air movement, no wheezing, no crackles, no rales  Abdomen: soft, non tender, BS +, no guarding  MSK/Extremities: no clubbing/cyanosis, no joint swelling  Skin: no rashes  Neuro: non focal, strength 5/5 in  all 4   Data Reviewed: Basic Metabolic Panel:  Recent Labs Lab 02/22/15 1027 02/23/15 1259 02/23/15 2007 02/24/15 0301  NA 136 137  --  141  K 5.0 4.9  --  4.4  CL 106 111  --  114*  CO2 18 18*  --  19*  GLUCOSE 132* 117*  --  75  BUN 14 8  --  9  CREATININE 1.36* 1.33*  --  1.05*  CALCIUM 13.6* 14.0*  --  12.7*  MG  --  1.0* 1.0* 1.5*  PHOS  --   --  2.7  --    Liver Function Tests:  Recent Labs Lab 02/23/15 1259  AST 31  ALT 29  ALKPHOS  54  BILITOT 0.5  PROT 7.8  ALBUMIN 4.3   No results for input(s): LIPASE, AMYLASE in the last 168 hours. No results for input(s): AMMONIA in the last 168 hours. CBC:  Recent Labs Lab 02/23/15 1254  WBC 7.0  NEUTROABS 4.0  HGB 13.2  HCT 40.9  MCV 92.7  PLT 201    Scheduled Meds: . atenolol  50 mg Oral Daily  . atorvastatin  20 mg Oral q1800  . calcitonin (salmon)  1 spray Alternating Nares Daily  . cinacalcet  30 mg Oral BID WC  . dorzolamide  1 drop Both Eyes TID  . heparin  5,000 Units Subcutaneous 3 times per day  . latanoprost  1 drop Both Eyes QHS  . pantoprazole  40 mg Oral Q1200  . thioridazine  100 mg Oral QHS   Continuous Infusions: . sodium chloride 125 mL/hr at 02/24/15 [id number]   Time spent: 25 minutes  Betty Pertostin Tram Wrenn, MD [facility] Pager [phone]. If 7 PM - 7 AM, please contact night-coverage at [email or web address], password [email or web address] 02/24/2015, 11:37 AM  LOS: 1 day

## 2015-02-25 LAB — CBC
HEMATOCRIT: 33 % — AB (ref 36.0–46.0)
HEMOGLOBIN: 10.8 g/dL — AB (ref 12.0–15.0)
MCH: 30.1 pg (ref 26.0–34.0)
MCHC: 32.7 g/dL (ref 30.0–36.0)
MCV: 91.9 fL (ref 78.0–100.0)
PLATELETS: 177 10*3/uL (ref 150–400)
RBC: 3.59 MIL/uL — AB (ref 3.87–5.11)
RDW: 12.6 % (ref 11.5–15.5)
WBC: 5.7 10*3/uL (ref 4.0–10.5)

## 2015-02-25 LAB — COMPREHENSIVE METABOLIC PANEL
ALT: 18 U/L (ref 14–54)
AST: 20 U/L (ref 15–41)
Albumin: 3.2 g/dL — ABNORMAL LOW (ref 3.5–5.0)
Alkaline Phosphatase: 43 U/L (ref 38–126)
Anion gap: 5 (ref 5–15)
BILIRUBIN TOTAL: 0.5 mg/dL (ref 0.3–1.2)
BUN: 5 mg/dL — ABNORMAL LOW (ref 6–20)
CO2: 21 mmol/L — ABNORMAL LOW (ref 22–32)
Calcium: 11.9 mg/dL — ABNORMAL HIGH (ref 8.9–10.3)
Chloride: 115 mmol/L — ABNORMAL HIGH (ref 101–111)
Creatinine, Ser: 1.08 mg/dL — ABNORMAL HIGH (ref 0.44–1.00)
GFR calc Af Amer: >60 mL/min (ref >60)
GFR calc non Af Amer: 54 mL/min — ABNORMAL LOW (ref >60)
GLUCOSE: 102 mg/dL — AB (ref 65–99)
Potassium: 3.7 mmol/L (ref 3.5–5.1)
Sodium: 141 mmol/L (ref 135–145)
Total Protein: 5.4 g/dL — ABNORMAL LOW (ref 6.5–8.1)

## 2015-02-25 LAB — MAGNESIUM: Magnesium: 1.2 mg/dL — ABNORMAL LOW (ref 1.7–2.4)

## 2015-02-25 LAB — CALCIUM: CALCIUM: 12.3 mg/dL — AB (ref 8.9–10.3)

## 2015-02-25 MED ORDER — SODIUM CHLORIDE 0.9 % IV SOLN
INTRAVENOUS | Status: AC
  Administered 2015-02-25: 16:00:00 via INTRAVENOUS

## 2015-02-25 MED ORDER — MAGNESIUM SULFATE 4 GM/100ML IV SOLN
4.0000 g | Freq: Once | INTRAVENOUS | Status: AC
  Administered 2015-02-25: 4 g via INTRAVENOUS
  Filled 2015-02-25: qty 100

## 2015-02-25 MED ORDER — FUROSEMIDE 10 MG/ML IJ SOLN
20.0000 mg | Freq: Once | INTRAMUSCULAR | Status: AC
  Administered 2015-02-25: 20 mg via INTRAVENOUS
  Filled 2015-02-25: qty 2

## 2015-02-25 NOTE — Progress Notes (Signed)
PROGRESS NOTE  [name]:[id number] DOB: September 18, 1951 DOA: 02/23/2015 PCP: [name], NP  HPI: [name] is a 64 y.o. female with PMH significant for primary hyperparathyroidism, HTN, glaucoma, HLD and schizophrenia; who came to ED secondary to generalized weakness, fatigue and polyuria. Patient was seen by PCP and sent to ED due to elevated Calcium. She was recently admitted due to hypercalcemia and was found to have primary hyperparathyroidism. There is concerns for non-compliance due to schizophrenia. Patient denies CP, nausea, vomiting, diarrhea, SOB, hematochezia, melena or any other complaints. [facility] called to admit patient for further evaluation and treatment.  Subjective / 24 H Interval events - no complaints  Assessment/Plan: Principal Problem:   Hypercalcemia Active Problems:   Hyperparathyroidism, primary   Schizophrenia   GERD (gastroesophageal reflux disease)   Essential hypertension   AKI (acute kidney injury)   Hypercalcemia - patient presented with polyuria, dehydration and extreme fatigue - found to have calcium 14.0 and Cr 1.33 - received 1 dose of Aredia - resumption of sensipar - Ca improving however needing more IV fluids for today   Hyperparathyroidism, primary - might need exploratory parathyroid surgery, defer to Dr. Talmage Nap this decision  Schizophrenia:  - currently oriented X 3 and denies hallucinations  - will continue mellaril  Glaucoma:  - will continue xalatan and trusopt  GERD (gastroesophageal reflux disease):  - continue protonix  Essential hypertension:  - will continue home medication regimen, except lisinopril given AKI - patient started on heart healthy diet  AKI:  - due to continue use of lisinopril and dehydration from hypercalcemia - nephrotoxic agents on hold - IV's initiated for resuscitation - Cr improving  Hypomagnasemia:  - will replete and follow electrolytes   Diet: Diet Heart Room service  appropriate?: Yes; Fluid consistency:: Thin Fluids: NS DVT Prophylaxis: heparin  Code Status: Full Code Family Communication: no family bedside  Disposition Plan: home when ready   Consultants:  None   Procedures:  None     Antibiotics  Anti-infectives    None       Studies  No results found.  Objective  Filed Vitals:   02/24/15 1327 02/24/15 2002 02/25/15 0345 02/25/15 0952  BP: 110/61 122/65 118/58 122/64  Pulse: 76 77 74 88  Temp: 98.6 F (37 C) 98.6 F (37 C) 98.6 F (37 C)   TempSrc: Oral Oral Oral   Resp: Height:      Weight:      SpO2: 100% 99% 98%     Intake/Output Summary (Last 24 hours) at 02/25/15 1521 Last data filed at 02/25/15 1413  Gross per 24 hour  Intake    240 ml  Output   3850 ml  Net  -3610 ml   Filed Weights   02/23/15 1735  Weight: 65.8 kg (145 lb 1 oz)    Exam:  General:  NAD  HEENT: no scleral icterus, PERRL  Cardiovascular: RRR without MRG, 2+ peripheral pulses, no edema  Respiratory: CTA biL, good air movement, no wheezing, no crackles, no rales  Abdomen: soft, non tender, BS +, no guarding  MSK/Extremities: no clubbing/cyanosis, no joint swelling  Skin: no rashes  Neuro: non focal, strength 5/5 in all 4   Data Reviewed: Basic Metabolic Panel:  Recent Labs Lab 02/22/15 1027 02/23/15 1259 02/23/15 2007 02/24/15 0301 02/25/15 0455 02/25/15 1338  NA 136 137  --  141 141  --   K 5.0 4.9  --  4.4  3.7  --   CL 106 111  --  114* 115*  --   CO2 18 18*  --  19* 21*  --   GLUCOSE 132* 117*  --  75 102*  --   BUN 14 8  --  9 5*  --   CREATININE 1.36* 1.33*  --  1.05* 1.08*  --   CALCIUM 13.6* 14.0*  --  12.7* 11.9* 12.3*  MG  --  1.0* 1.0* 1.5* 1.2*  --   PHOS  --   --  2.7  --   --   --    Liver Function Tests:  Recent Labs Lab 02/23/15 1259 02/25/15 0455  AST 31 20  ALT 29 18  ALKPHOS 54 43  BILITOT 0.5 0.5  PROT 7.8 5.4*  ALBUMIN 4.3 3.2*   No results for input(s): LIPASE,  AMYLASE in the last 168 hours. No results for input(s): AMMONIA in the last 168 hours. CBC:  Recent Labs Lab 02/23/15 1254 02/25/15 0455  WBC 7.0 5.7  NEUTROABS 4.0  --   HGB 13.2 10.8*  HCT 40.9 33.0*  MCV 92.7 91.9  PLT 201 177    Scheduled Meds: . atenolol  50 mg Oral Daily  . atorvastatin  20 mg Oral q1800  . calcitonin (salmon)  1 spray Alternating Nares Daily  . cinacalcet  30 mg Oral BID WC  . dorzolamide  1 drop Both Eyes TID  . heparin  5,000 Units Subcutaneous 3 times per day  . latanoprost  1 drop Both Eyes QHS  . pantoprazole  40 mg Oral Q1200  . thioridazine  100 mg Oral QHS   Continuous Infusions: . sodium chloride      Pamella Pertostin Naila Elizondo, MD [facility] Pager [phone]. If 7 PM - 7 AM, please contact night-coverage at [email or web address], password [email or web address] 02/25/2015, 3:21 PM  LOS: 2 days

## 2015-02-26 ENCOUNTER — Telehealth: Payer: Self-pay

## 2015-02-26 LAB — COMPREHENSIVE METABOLIC PANEL
ALBUMIN: 3.1 g/dL — AB (ref 3.5–5.0)
ALT: 18 U/L (ref 14–54)
AST: 19 U/L (ref 15–41)
Alkaline Phosphatase: 45 U/L (ref 38–126)
Anion gap: 6 (ref 5–15)
BUN: <5 mg/dL — ABNORMAL LOW (ref 6–20)
CHLORIDE: 112 mmol/L — AB (ref 101–111)
CO2: 23 mmol/L (ref 22–32)
CREATININE: 1.15 mg/dL — AB (ref 0.44–1.00)
Calcium: 11.6 mg/dL — ABNORMAL HIGH (ref 8.9–10.3)
GFR calc Af Amer: 58 mL/min — ABNORMAL LOW (ref >60)
GFR, EST NON AFRICAN AMERICAN: 50 mL/min — AB (ref >60)
Glucose, Bld: 94 mg/dL (ref 65–99)
POTASSIUM: 3.3 mmol/L — AB (ref 3.5–5.1)
SODIUM: 141 mmol/L (ref 135–145)
Total Bilirubin: 0.4 mg/dL (ref 0.3–1.2)
Total Protein: 5.9 g/dL — ABNORMAL LOW (ref 6.5–8.1)

## 2015-02-26 LAB — MAGNESIUM: Magnesium: 1.4 mg/dL — ABNORMAL LOW (ref 1.7–2.4)

## 2015-02-26 MED ORDER — MAGNESIUM GLUCONATE 500 MG PO TABS: 500.0000 mg | ORAL_TABLET | Freq: Every day | ORAL | Status: DC

## 2015-02-26 MED ORDER — MAGNESIUM SULFATE 2 GM/50ML IV SOLN
2.0000 g | Freq: Once | INTRAVENOUS | Status: AC
  Administered 2015-02-26: 2 g via INTRAVENOUS
  Filled 2015-02-26: qty 50

## 2015-02-26 MED ORDER — POTASSIUM CHLORIDE CRYS ER 20 MEQ PO TBCR
40.0000 meq | EXTENDED_RELEASE_TABLET | Freq: Once | ORAL | Status: AC
  Administered 2015-02-26: 40 meq via ORAL
  Filled 2015-02-26: qty 2

## 2015-02-26 NOTE — Discharge Instructions (Signed)
Follow with Janyth Contes, JESSICA K, NP in 5-7 days Follow up with Dr. Talmage Nap in 1 week  Please get a complete blood count and chemistry panel checked by your Primary MD at your next visit, and again as instructed by your Primary MD. Please get your medications reviewed and adjusted by your Primary MD.  Please request your Primary MD to go over all Hospital Tests and Procedure/Radiological results at the follow up, please get all Hospital records sent to your Prim MD by signing hospital release before you go home.  If you had Pneumonia of Lung problems at the Hospital: Please get a 2 view Chest X ray done in 6-8 weeks after hospital discharge or sooner if instructed by your Primary MD.  If you have Congestive Heart Failure: Please call your Cardiologist or Primary MD anytime you have any of the following symptoms:  1) 3 pound weight gain in 24 hours or 5 pounds in 1 week  2) shortness of breath, with or without a dry hacking cough  3) swelling in the hands, feet or stomach  4) if you have to sleep on extra pillows at night in order to breathe  Follow cardiac low salt diet and 1.5 lit/day fluid restriction.  If you have diabetes Accuchecks 4 times/day, Once in AM empty stomach and then before each meal. Log in all results and show them to your primary doctor at your next visit. If any glucose reading is under 80 or above 300 call your primary MD immediately.  If you have Seizure/Convulsions/Epilepsy: Please do not drive, operate heavy machinery, participate in activities at heights or participate in high speed sports until you have seen by Primary MD or a Neurologist and advised to do so again.  If you had Gastrointestinal Bleeding: Please ask your Primary MD to check a complete blood count within one week of discharge or at your next visit. Your endoscopic/colonoscopic biopsies that are pending at the time of discharge, will also need to followed by your Primary MD.  Get Medicines reviewed  and adjusted. Please take all your medications with you for your next visit with your Primary MD  Please request your Primary MD to go over all hospital tests and procedure/radiological results at the follow up, please ask your Primary MD to get all Hospital records sent to his/her office.  If you experience worsening of your admission symptoms, develop shortness of breath, life threatening emergency, suicidal or homicidal thoughts you must seek medical attention immediately by calling 911 or calling your MD immediately  if symptoms less severe.  You must read complete instructions/literature along with all the possible adverse reactions/side effects for all the Medicines you take and that have been prescribed to you. Take any new Medicines after you have completely understood and accpet all the possible adverse reactions/side effects.   Do not drive or operate heavy machinery when taking Pain medications.   Do not take more than prescribed Pain, Sleep and Anxiety Medications  Special Instructions: If you have smoked or chewed Tobacco  in the last 2 yrs please stop smoking, stop any regular Alcohol  and or any Recreational drug use.  Wear Seat belts while driving.  Please note You were cared for by a hospitalist during your hospital stay. If you have any questions about your discharge medications or the care you received while you were in the hospital after you are discharged, you can call the unit and asked to speak with the hospitalist on call if the hospitalist  that took care of you is not available. Once you are discharged, your primary care physician will handle any further medical issues. Please note that NO REFILLS for any discharge medications will be authorized once you are discharged, as it is imperative that you return to your primary care physician (or establish a relationship with a primary care physician if you do not have one) for your aftercare needs so that they can reassess your  need for medications and monitor your lab values.  You can reach the hospitalist office at phone [phone] or fax [phone]   If you do not have a primary care physician, you can call [phone] for a physician referral.  Activity: As tolerated with Full fall precautions use walker/cane & assistance as needed  Diet: regular  Disposition Home

## 2015-02-26 NOTE — Progress Notes (Signed)
Patient D/C education done. Medication education done. IV removed. Tele box removed.  Belongings given to sister, Shirlene. Wheeled out to the front to D/C home with friend.   Valinda HoarLexie Arnie Maiolo RN

## 2015-02-26 NOTE — Telephone Encounter (Signed)
Per Abbey ChattersJessica Eubanks, NP, called patient to make appt, for hospital follow-up, patient has someone on the other line, she'll call back to make appt.

## 2015-02-26 NOTE — Discharge Summary (Signed)
Physician Discharge Summary  Betty Ageereva C Marasigan ZOX:[id number] DOB: Jul 12, 1951 DOA: 02/23/2015  PCP: Sharon SellerEUBANKS, JESSICA K, NP  Admit date: 02/23/2015 Discharge date: 02/26/2015  Time spent: > 30 minutes  Recommendations for Outpatient Follow-up:  1. Follow up with Dr. Janyth ContesEubanks in 3-4 days  2. Repeat Ca and Mg levels 3. Follow up with Dr. Talmage NapBalan in 1-2 weeks   Discharge Diagnoses:  Principal Problem:   Hypercalcemia Active Problems:   Hyperparathyroidism, primary   Schizophrenia   GERD (gastroesophageal reflux disease)   Essential hypertension   AKI (acute kidney injury)  Discharge Condition: stable  Diet recommendation: regular   Filed Weights   02/23/15 1735  Weight: 65.8 kg (145 lb 1 oz)   History of present illness:  [name] is a 64 y.o. female with PMH significant for primary hyperparathyroidism, HTN, glaucoma, HLD and schizophrenia; who came to ED secondary to generalized weakness, fatigue and polyuria. Patient was seen by PCP and sent to ED due to elevated Calcium. She was recently admitted due to hypercalcemia and was found to have primary hyperparathyroidism. There is concerns for non-compliance due to schizophrenia. Patient denies CP, nausea, vomiting, diarrhea, SOB, hematochezia, melena or any other complaints. [facility] called to admit patient for further evaluation and treatment.  Hospital Course:  Patient was admitted with recurrent hypercalcemia. She has known primary hyperparathyroidism with persistently elevated calcium and PTH levels. She was admitted with Ca levels of 14, was placed on IVF and Lasix x 2, and has received a dose of pamidronate on admission as well as nasal calcitonin x 3 doses. Her calcium levels have gradually improved and on discharge her calcium was 11.6. From hypercalcemia standpoint patient was asymptomatic while here. There is reportedly a component of non compliance at home, and I was able to discuss with patient's sister who plans to help Mrs.  Mantz with her medications and get her a pill box. As far as her hyperparathyroidism is concerned she has had a recent negative nuclear study to localize her adenoma. She has had now 2 hospitalizations and I wonder whether she might benefit from surgery. She was discharged home in stable condition.   Procedures:  None    Consultations:  None   Discharge Exam: Filed Vitals:   02/25/15 0952 02/25/15 1846 02/25/15 2023 02/26/15 0435  BP: 122/64 121/60 115/59 110/55  Pulse: 88  75 82  Temp:  99 F (37.2 C) 98.9 F (37.2 C) 98.2 F (36.8 C)  TempSrc:  Oral Oral Oral  Resp:  17 18 18   Height:      Weight:      SpO2:  99% 98% 95%   General: NAD Cardiovascular: RRR Respiratory: CTA biL  Discharge Instructions    Medication List    TAKE these medications        alendronate 70 MG tablet  Commonly known as:  FOSAMAX  Take 70 mg by mouth once a week.     atenolol 50 MG tablet  Commonly known as:  TENORMIN  TAKE 1 TABLET BY MOUTH EVERY DAY     atorvastatin 20 MG tablet  Commonly known as:  LIPITOR  Take 1 tablet (20 mg total) by mouth daily.     cinacalcet 30 MG tablet  Commonly known as:  SENSIPAR  Take 1 tablet (30 mg total) by mouth 2 (two) times daily with a meal.     dorzolamide 2 % ophthalmic solution  Commonly known as:  TRUSOPT  Place 1 drop into both eyes  3 (three) times daily.     enalapril 10 MG tablet  Commonly known as:  VASOTEC  TAKE 1 TABLET BY MOUTH DAILY     latanoprost 0.005 % ophthalmic solution  Commonly known as:  XALATAN  Place 1 drop into both eyes at bedtime.     magnesium gluconate 500 MG tablet  Commonly known as:  MAGONATE  Take 1 tablet (500 mg total) by mouth daily.     NEOSPORIN ORIGINAL EX  Apply 1 application topically 4 (four) times daily as needed (For skin irritation).     thioridazine 50 MG tablet  Commonly known as:  MELLARIL  Take 1 tablet (50 mg total) by mouth at bedtime. Take 2 tablets at bedtime.             Follow-up Information    Follow up with Dorisann Frames, MD. Schedule an appointment as soon as possible for a visit in 1 week.   Specialty:  Endocrinology   Contact information:   [address] [place] [address] [place] [address] [phone]       Follow up with [name], NP. Schedule an appointment as soon as possible for a visit in 1 week.   Specialty:  Nurse Practitioner   Contact information:   [address] [place] [address] [phone]      The results of significant diagnostics from this hospitalization (including imaging, microbiology, ancillary and laboratory) are listed below for reference.    Labs: Basic Metabolic Panel:  Recent Labs Lab 02/22/15 1027 02/23/15 1259 02/23/15 2007 02/24/15 0301 02/25/15 0455 02/25/15 1338 02/26/15 0430  NA 136 137  --  141 141  --  141  K 5.0 4.9  --  4.4 3.7  --  3.3*  CL 106 111  --  114* 115*  --  112*  CO2 18 18*  --  19* 21*  --  23  GLUCOSE 132* 117*  --  75 102*  --  94  BUN 14 8  --  9 5*  --  <5*  CREATININE 1.36* 1.33*  --  1.05* 1.08*  --  1.15*  CALCIUM 13.6* 14.0*  --  12.7* 11.9* 12.3* 11.6*  MG  --  1.0* 1.0* 1.5* 1.2*  --  1.4*  PHOS  --   --  2.7  --   --   --   --    Liver Function Tests:  Recent Labs Lab 02/23/15 1259 02/25/15 0455 02/26/15 0430  AST ALT ALKPHOS 54 43 45  BILITOT 0.5 0.5 0.4  PROT 7.8 5.4* 5.9*  ALBUMIN 4.3 3.2* 3.1*   CBC:  Recent Labs Lab 02/23/15 1254 02/25/15 0455  WBC 7.0 5.7  NEUTROABS 4.0  --   HGB 13.2 10.8*  HCT 40.9 33.0*  MCV 92.7 91.9  PLT 201 177    Signed:  Elvyn Krohn  [facility] 02/26/2015, 1:56 PM

## 2015-02-26 NOTE — Care Management Note (Signed)
Case Management Note  Patient Details  Name: [name] MRN: [id number] Date of Birth: [birth year]  Subjective/Objective:    Pt admitted with Hypercalcemia.                Action/Plan:  Pt has discharge orders signed.  CM will assess pt for post discharge needs   Expected Discharge Date:                  Expected Discharge Plan:  Home/Self Care  In-House Referral:     Discharge planning Services  CM Consult  Post Acute Care Choice:    Choice offered to:     DME Arranged:    DME Agency:     HH Arranged:    HH Agency:     Status of Service:  Completed, signed off  Medicare Important Message Given:  Yes Date Medicare IM Given:  02/26/15 Medicare IM give by:    Date Additional Medicare IM Given:    Additional Medicare Important Message give by:     If discussed at Long Length of Stay Meetings, dates discussed:    Additional Comments: 02/26/15 Raynald BlendSamantha Zyliah Schier, RN, BSN [phone] CM assessed pt prior to discharge.  Pt stated that she is independent at home with spouse and sibling support.  Pt denied equipment and Home Health needs.  No additional CM needs.     Cherylann ParrClaxton, Rakayla Ricklefs S, RN 02/26/2015, 10:28 AM

## 2015-02-27 NOTE — Progress Notes (Signed)
UR Completed. Tykisha Areola, RN, BSN.  [phone] 

## 2015-03-01 ENCOUNTER — Other Ambulatory Visit: Payer: Medicare Other

## 2015-03-01 DIAGNOSIS — E876 Hypokalemia: Secondary | ICD-10-CM | POA: Diagnosis not present

## 2015-03-01 DIAGNOSIS — N289 Disorder of kidney and ureter, unspecified: Secondary | ICD-10-CM

## 2015-03-02 ENCOUNTER — Telehealth: Payer: Self-pay

## 2015-03-02 LAB — BASIC METABOLIC PANEL
BUN/Creatinine Ratio: 5 — ABNORMAL LOW (ref 11–26)
BUN: 5 mg/dL — ABNORMAL LOW (ref 8–27)
CHLORIDE: 109 mmol/L — AB (ref 97–108)
CO2: 21 mmol/L (ref 18–29)
Calcium: 12.4 mg/dL — ABNORMAL HIGH (ref 8.7–10.3)
Creatinine, Ser: 1 mg/dL (ref 0.57–1.00)
GFR, EST AFRICAN AMERICAN: 70 mL/min/{1.73_m2} (ref >59)
GFR, EST NON AFRICAN AMERICAN: 61 mL/min/{1.73_m2} (ref >59)
Glucose: 114 mg/dL — ABNORMAL HIGH (ref 65–99)
POTASSIUM: 4.3 mmol/L (ref 3.5–5.2)
Sodium: 144 mmol/L (ref 134–144)

## 2015-03-02 NOTE — Telephone Encounter (Signed)
[name] called -fax patient's lab (BMP) to Dr. Talmage Nap about her Calcium 12.4, is it ok to wait for her appt 03/08/15 to see Dr. Talmage Nap. Faxed last two labs to [phone] with cover sheet, per Jessica's order with her cell number on it

## 2015-03-02 NOTE — Telephone Encounter (Signed)
Faxed message and labs to Dr. Lara Mulch office [phone] , it returned. Shanda Bumps called wanted me to call the office and leave message for Dr. Talmage Nap about the Calcium 12.4. Called the office it was closed (Friday afternoon) left message at 2:42 with the answering service to have the doctor on call to call me. At 5:55 no call back, called the answering service again, told them to call Abbey Chatters, NP at [phone]. Then called Shanda Bumps to let her know about the call.

## 2015-03-05 ENCOUNTER — Other Ambulatory Visit: Payer: Self-pay | Admitting: Nurse Practitioner

## 2015-03-06 ENCOUNTER — Ambulatory Visit: Payer: Medicare Other | Admitting: Nurse Practitioner

## 2015-03-08 ENCOUNTER — Ambulatory Visit (INDEPENDENT_AMBULATORY_CARE_PROVIDER_SITE_OTHER): Payer: Medicare Other | Admitting: Nurse Practitioner

## 2015-03-08 ENCOUNTER — Inpatient Hospital Stay (HOSPITAL_COMMUNITY)
Admission: AD | Admit: 2015-03-08 | Discharge: 2015-03-16 | DRG: 626 | Disposition: A | Discharge status: Home / Self Care | Payer: Medicare Other | Source: Ambulatory Visit | Attending: Internal Medicine | Admitting: Internal Medicine

## 2015-03-08 ENCOUNTER — Encounter: Payer: Self-pay | Admitting: Nurse Practitioner

## 2015-03-08 ENCOUNTER — Inpatient Hospital Stay (HOSPITAL_COMMUNITY): Payer: Medicare Other

## 2015-03-08 VITALS — BP 120/60 | HR 101 | Temp 98.3°F | Resp 18 | Ht 62.0 in | Wt 144.6 lb

## 2015-03-08 DIAGNOSIS — R531 Weakness: Secondary | ICD-10-CM | POA: Diagnosis not present

## 2015-03-08 DIAGNOSIS — F17210 Nicotine dependence, cigarettes, uncomplicated: Secondary | ICD-10-CM | POA: Diagnosis present

## 2015-03-08 DIAGNOSIS — H409 Unspecified glaucoma: Secondary | ICD-10-CM | POA: Diagnosis present

## 2015-03-08 DIAGNOSIS — E785 Hyperlipidemia, unspecified: Secondary | ICD-10-CM | POA: Diagnosis not present

## 2015-03-08 DIAGNOSIS — E042 Nontoxic multinodular goiter: Secondary | ICD-10-CM | POA: Diagnosis not present

## 2015-03-08 DIAGNOSIS — I10 Essential (primary) hypertension: Secondary | ICD-10-CM | POA: Diagnosis present

## 2015-03-08 DIAGNOSIS — E559 Vitamin D deficiency, unspecified: Secondary | ICD-10-CM | POA: Diagnosis present

## 2015-03-08 DIAGNOSIS — E860 Dehydration: Secondary | ICD-10-CM | POA: Diagnosis not present

## 2015-03-08 DIAGNOSIS — E875 Hyperkalemia: Secondary | ICD-10-CM

## 2015-03-08 DIAGNOSIS — F209 Schizophrenia, unspecified: Secondary | ICD-10-CM | POA: Diagnosis not present

## 2015-03-08 DIAGNOSIS — E210 Primary hyperparathyroidism: Principal | ICD-10-CM | POA: Diagnosis present

## 2015-03-08 DIAGNOSIS — M8580 Other specified disorders of bone density and structure, unspecified site: Secondary | ICD-10-CM | POA: Diagnosis present

## 2015-03-08 DIAGNOSIS — N179 Acute kidney failure, unspecified: Secondary | ICD-10-CM | POA: Diagnosis not present

## 2015-03-08 DIAGNOSIS — R5383 Other fatigue: Secondary | ICD-10-CM | POA: Diagnosis not present

## 2015-03-08 DIAGNOSIS — E876 Hypokalemia: Secondary | ICD-10-CM | POA: Diagnosis not present

## 2015-03-08 DIAGNOSIS — E213 Hyperparathyroidism, unspecified: Secondary | ICD-10-CM | POA: Diagnosis not present

## 2015-03-08 DIAGNOSIS — K219 Gastro-esophageal reflux disease without esophagitis: Secondary | ICD-10-CM | POA: Diagnosis present

## 2015-03-08 DIAGNOSIS — F2089 Other schizophrenia: Secondary | ICD-10-CM | POA: Diagnosis not present

## 2015-03-08 LAB — CBC WITH DIFFERENTIAL/PLATELET
BASOS ABS: 0 10*3/uL (ref 0.0–0.1)
Basophils Relative: 0 % (ref 0–1)
EOS ABS: 0.1 10*3/uL (ref 0.0–0.7)
EOS PCT: 2 % (ref 0–5)
HCT: 37 % (ref 36.0–46.0)
Hemoglobin: 11.9 g/dL — ABNORMAL LOW (ref 12.0–15.0)
LYMPHS PCT: 32 % (ref 12–46)
Lymphs Abs: 2.2 10*3/uL (ref 0.7–4.0)
MCH: 29.7 pg (ref 26.0–34.0)
MCHC: 32.2 g/dL (ref 30.0–36.0)
MCV: 92.3 fL (ref 78.0–100.0)
Monocytes Absolute: 0.6 10*3/uL (ref 0.1–1.0)
Monocytes Relative: 8 % (ref 3–12)
NEUTROS PCT: 58 % (ref 43–77)
Neutro Abs: 4 10*3/uL (ref 1.7–7.7)
PLATELETS: 172 10*3/uL (ref 150–400)
RBC: 4.01 MIL/uL (ref 3.87–5.11)
RDW: 12.5 % (ref 11.5–15.5)
WBC: 6.9 10*3/uL (ref 4.0–10.5)

## 2015-03-08 LAB — COMPREHENSIVE METABOLIC PANEL
ALT: 21 U/L (ref 14–54)
ANION GAP: 7 (ref 5–15)
AST: 24 U/L (ref 15–41)
Albumin: 4 g/dL (ref 3.5–5.0)
Alkaline Phosphatase: 48 U/L (ref 38–126)
BILIRUBIN TOTAL: 0.6 mg/dL (ref 0.3–1.2)
BUN: 9 mg/dL (ref 6–20)
CHLORIDE: 108 mmol/L (ref 101–111)
CO2: 24 mmol/L (ref 22–32)
CREATININE: 1.27 mg/dL — AB (ref 0.44–1.00)
Calcium: 14.4 mg/dL (ref 8.9–10.3)
GFR calc Af Amer: 51 mL/min — ABNORMAL LOW (ref >60)
GFR, EST NON AFRICAN AMERICAN: 44 mL/min — AB (ref >60)
Glucose, Bld: 103 mg/dL — ABNORMAL HIGH (ref 65–99)
Potassium: 4.2 mmol/L (ref 3.5–5.1)
Sodium: 139 mmol/L (ref 135–145)
Total Protein: 6.7 g/dL (ref 6.5–8.1)

## 2015-03-08 LAB — URINALYSIS, ROUTINE W REFLEX MICROSCOPIC
Bilirubin Urine: NEGATIVE
Glucose, UA: NEGATIVE mg/dL
Hgb urine dipstick: NEGATIVE
KETONES UR: NEGATIVE mg/dL
Leukocytes, UA: NEGATIVE
Nitrite: NEGATIVE
Protein, ur: NEGATIVE mg/dL
Specific Gravity, Urine: 1.005 (ref 1.005–1.030)
Urobilinogen, UA: 0.2 mg/dL (ref 0.0–1.0)
pH: 7 (ref 5.0–8.0)

## 2015-03-08 LAB — TSH: TSH: 3.106 u[IU]/mL (ref 0.350–4.500)

## 2015-03-08 LAB — MAGNESIUM: MAGNESIUM: 0.8 mg/dL — AB (ref 1.7–2.4)

## 2015-03-08 LAB — PHOSPHORUS: Phosphorus: 2.7 mg/dL (ref 2.5–4.6)

## 2015-03-08 MED ORDER — ENOXAPARIN SODIUM 40 MG/0.4ML ~~LOC~~ SOLN
40.0000 mg | SUBCUTANEOUS | Status: DC
  Administered 2015-03-09: 40 mg via SUBCUTANEOUS
  Filled 2015-03-08 (×3): qty 0.4

## 2015-03-08 MED ORDER — ALUM & MAG HYDROXIDE-SIMETH 200-200-20 MG/5ML PO SUSP
30.0000 mL | Freq: Four times a day (QID) | ORAL | Status: DC | PRN
  Administered 2015-03-09 – 2015-03-11 (×3): 30 mL via ORAL
  Filled 2015-03-08 (×3): qty 30

## 2015-03-08 MED ORDER — SODIUM CHLORIDE 0.9 % IV BOLUS (SEPSIS)
1000.0000 mL | Freq: Once | INTRAVENOUS | Status: AC
  Administered 2015-03-09: 1000 mL via INTRAVENOUS

## 2015-03-08 MED ORDER — LATANOPROST 0.005 % OP SOLN
1.0000 [drp] | Freq: Every day | OPHTHALMIC | Status: DC
  Administered 2015-03-08 – 2015-03-15 (×8): 1 [drp] via OPHTHALMIC
  Filled 2015-03-08: qty 2.5

## 2015-03-08 MED ORDER — THIORIDAZINE HCL 10 MG PO TABS
10.0000 mg | ORAL_TABLET | Freq: Once | ORAL | Status: AC
  Administered 2015-03-08: 10 mg via ORAL
  Filled 2015-03-08: qty 1

## 2015-03-08 MED ORDER — ONDANSETRON HCL 4 MG/2ML IJ SOLN: 4.0000 mg | Freq: Four times a day (QID) | INTRAMUSCULAR | Status: DC | PRN

## 2015-03-08 MED ORDER — ONDANSETRON HCL 4 MG PO TABS: 4.0000 mg | ORAL_TABLET | Freq: Four times a day (QID) | ORAL | Status: DC | PRN

## 2015-03-08 MED ORDER — ENALAPRIL MALEATE 10 MG PO TABS
10.0000 mg | ORAL_TABLET | Freq: Every day | ORAL | Status: DC
  Administered 2015-03-08: 10 mg via ORAL
  Filled 2015-03-08 (×2): qty 1

## 2015-03-08 MED ORDER — MAGNESIUM GLUCONATE 500 MG PO TABS: 500.0000 mg | ORAL_TABLET | Freq: Every day | ORAL | Status: DC

## 2015-03-08 MED ORDER — SODIUM CHLORIDE 0.9 % IV SOLN
INTRAVENOUS | Status: AC
  Administered 2015-03-08: 1000 mL via INTRAVENOUS

## 2015-03-08 MED ORDER — SODIUM CHLORIDE 0.9 % IV BOLUS (SEPSIS): 1000.0000 mL | Freq: Once | INTRAVENOUS | Status: DC

## 2015-03-08 MED ORDER — CINACALCET HCL 30 MG PO TABS
30.0000 mg | ORAL_TABLET | Freq: Two times a day (BID) | ORAL | Status: DC
  Administered 2015-03-09 – 2015-03-11 (×5): 30 mg via ORAL
  Filled 2015-03-08 (×7): qty 1

## 2015-03-08 MED ORDER — ATENOLOL 50 MG PO TABS
50.0000 mg | ORAL_TABLET | Freq: Every day | ORAL | Status: DC
  Administered 2015-03-08 – 2015-03-16 (×9): 50 mg via ORAL
  Filled 2015-03-08 (×9): qty 1

## 2015-03-08 MED ORDER — FUROSEMIDE 10 MG/ML IJ SOLN
20.0000 mg | Freq: Two times a day (BID) | INTRAMUSCULAR | Status: DC
  Administered 2015-03-09: 20 mg via INTRAVENOUS
  Filled 2015-03-08 (×2): qty 2

## 2015-03-08 MED ORDER — DORZOLAMIDE HCL 2 % OP SOLN
1.0000 [drp] | Freq: Three times a day (TID) | OPHTHALMIC | Status: DC
  Administered 2015-03-08 – 2015-03-16 (×22): 1 [drp] via OPHTHALMIC
  Filled 2015-03-08: qty 10

## 2015-03-08 MED ORDER — MAGNESIUM OXIDE 400 (241.3 MG) MG PO TABS
400.0000 mg | ORAL_TABLET | Freq: Every day | ORAL | Status: DC
  Administered 2015-03-09 – 2015-03-16 (×8): 400 mg via ORAL
  Filled 2015-03-08 (×8): qty 1

## 2015-03-08 MED ORDER — POTASSIUM CHLORIDE CRYS ER 20 MEQ PO TBCR
40.0000 meq | EXTENDED_RELEASE_TABLET | Freq: Every day | ORAL | Status: DC
  Administered 2015-03-12: 40 meq via ORAL
  Filled 2015-03-08 (×6): qty 2

## 2015-03-08 MED ORDER — MAGNESIUM GLUCONATE 500 MG PO TABS: 500.0000 mg | ORAL_TABLET | Freq: Every day | ORAL | Status: AC

## 2015-03-08 MED ORDER — SODIUM CHLORIDE 0.9 % IJ SOLN
3.0000 mL | Freq: Two times a day (BID) | INTRAMUSCULAR | Status: DC
  Administered 2015-03-10 – 2015-03-12 (×3): 3 mL via INTRAVENOUS
  Administered 2015-03-12: 10 mL via INTRAVENOUS
  Administered 2015-03-15: 3 mL via INTRAVENOUS

## 2015-03-08 MED ORDER — MAGNESIUM SULFATE 2 GM/50ML IV SOLN
2.0000 g | Freq: Once | INTRAVENOUS | Status: AC
  Administered 2015-03-08: 2 g via INTRAVENOUS
  Filled 2015-03-08: qty 50

## 2015-03-08 MED ORDER — ATORVASTATIN CALCIUM 20 MG PO TABS
20.0000 mg | ORAL_TABLET | Freq: Every day | ORAL | Status: DC
  Administered 2015-03-08 – 2015-03-16 (×9): 20 mg via ORAL
  Filled 2015-03-08 (×9): qty 1

## 2015-03-08 NOTE — Progress Notes (Addendum)
Patient arrived on the floor with her family from doctor's office to [phone] via wheelchair; alert and oriented x 4; no complaints of pain; IV started in RFA; skin intact. Orient patient to room and unit; watch safety video; gave patient care guide; instructed how to use the call bell and  fall risk precautions. Will continue to monitor the patient.

## 2015-03-08 NOTE — H&P (Signed)
PCP:   [name], NP   Chief Complaint:  tired  HPI: 64 yo female h/o primary hyperparathyroidism, htn sent in from PCP office for fatigue.  Pt reports when she went to see her pcp today she was very tired and needed to take a nap.  She did not have any focal neurological deficits.  She denies any pain anywhere.  No fevers.  No n/v/d.  She is being arranged as an outpt to have surgical removal of her parathyroids.  Pt does not really know why she was sent to the hospital.  Husband is also present and reports that his wife was just tired and sleepy today.  Pt denies any issues with sleeping at night.  She was directly admitted in order to expediate her surgery as an inpatient.  No labs done in pcp office today.  Pt denies any abnormality in gait.  Review of Systems:  Positive and negative as per HPI otherwise all other systems are negative  Past Medical History: Past Medical History  Diagnosis Date  . Hyperparathyroidism   . Hypertension   . Osteopenia   . Vitamin D deficiency   . Cough   . Other abnormal blood chemistry   . Chlamydia trachomatis infection of unspecified genitourinary site 12/18/2011  . Disorder of bone and cartilage, unspecified   . Edema   . Tobacco use disorder   . Tachycardia, unspecified 05/29/2010  . Unspecified glaucoma   . Hypercalcemia 02/14/2015  . Hyperlipemia   . Schizophrenia     "little bit" (02/23/2015)   Past Surgical History  Procedure Laterality Date  . No past surgeries      Medications: Prior to Admission medications   Medication Sig Start Date End Date Taking? Authorizing Provider  alendronate (FOSAMAX) 70 MG tablet Take 70 mg by mouth once a week. 01/03/15   Historical Provider, MD  atenolol (TENORMIN) 50 MG tablet TAKE 1 TABLET BY MOUTH EVERY DAY 11/17/14   Oneal Grout, MD  atorvastatin (LIPITOR) 20 MG tablet Take 1 tablet (20 mg total) by mouth daily. 07/25/14   Oneal Grout, MD  cinacalcet (SENSIPAR) 30 MG tablet Take 1  tablet (30 mg total) by mouth 2 (two) times daily with a meal. 02/16/15   Joseph Art, DO  dorzolamide (TRUSOPT) 2 % ophthalmic solution Place 1 drop into both eyes 3 (three) times daily.    Historical Provider, MD  enalapril (VASOTEC) 10 MG tablet TAKE 1 TABLET BY MOUTH DAILY 01/24/15   Kimber Relic, MD  latanoprost (XALATAN) 0.005 % ophthalmic solution Place 1 drop into both eyes at bedtime.    Historical Provider, MD  LUMIGAN 0.01 % SOLN Place 1 drop into both eyes at bedtime. 02/27/15   Historical Provider, MD  magnesium gluconate (MAGONATE) 500 MG tablet Take 1 tablet (500 mg total) by mouth daily. 03/08/15   [name], NP  Neomycin-Bacitracin-Polymyxin (NEOSPORIN ORIGINAL EX) Apply 1 application topically 4 (four) times daily as needed (For skin irritation).    Historical Provider, MD  potassium chloride SA (K-DUR,KLOR-CON) 20 MEQ tablet Take 40 mEq by mouth daily.    Historical Provider, MD  thioridazine (MELLARIL) 50 MG tablet TAKE 1 OR 2 TABLETS BY MOUTH AT BEDTIME 03/05/15   Tiffany L Reed, DO    Allergies:  No Known Allergies  Social History:  reports that she has been smoking Cigarettes.  She has a 5.64 pack-year smoking history. She has never used smokeless tobacco. She reports that she drinks alcohol.  She reports that she does not use illicit drugs.  Family History: Family History  Problem Relation Age of Onset  . Hypertension Other   . Diabetes Other   . Cancer Mother   . Cancer Brother     Throat    Physical Exam: Filed Vitals:   03/08/15 1805  BP: 112/58  Pulse: 91  Temp: 99.4 F (37.4 C)  TempSrc: Oral  Resp: 18  SpO2: 96%   General appearance: alert, cooperative and no distress Head: Normocephalic, without obvious abnormality, atraumatic Eyes: negative Nose: Nares normal. Septum midline. Mucosa normal. No drainage or sinus tenderness. Neck: no JVD and supple, symmetrical, trachea midline Lungs: clear to auscultation bilaterally Heart: regular rate  and rhythm, S1, S2 normal, no murmur, click, rub or gallop Abdomen: soft, non-tender; bowel sounds normal; no masses,  no organomegaly Extremities: extremities normal, atraumatic, no cyanosis or edema Pulses: 2+ and symmetric Skin: Skin color, texture, turgor normal. No rashes or lesions Neurologic: Grossly normal    Labs on Admission:  No results for input(s): NA, K, CL, CO2, GLUCOSE, BUN, CREATININE, CALCIUM, MG, PHOS in the last 72 hours. No results for input(s): AST, ALT, ALKPHOS, BILITOT, PROT, ALBUMIN in the last 72 hours. No results for input(s): LIPASE, AMYLASE in the last 72 hours. No results for input(s): WBC, NEUTROABS, HGB, HCT, MCV, PLT in the last 72 hours. No results for input(s): CKTOTAL, CKMB, CKMBINDEX, TROPONINI in the last 72 hours. No results for input(s): TSH, T4TOTAL, T3FREE, THYROIDAB in the last 72 hours.  Invalid input(s): FREET3 No results for input(s): VITAMINB12, FOLATE, FERRITIN, TIBC, IRON, RETICCTPCT in the last 72 hours.  Radiological Exams on Admission: No results found.  Assessment/Plan  64 yo female with fatigue and h/o primary hyperparathyroidism  Principal Problem:   Hyperparathyroidism, primary-  Check labs.  No acute reason for surgical intervention at this time.    Active Problems:   Schizophrenia- stable   Essential hypertension- stable cont home meds   Hypercalcemia-  Labs pending  Observe on tele floor.  Full code.  Mancel Lardizabal A 03/08/2015, 6:14 PM

## 2015-03-08 NOTE — Progress Notes (Signed)
Lab called about critical values of Ca 14.4 & Mg 0.8. Craige Cotta NP paged and notified.

## 2015-03-08 NOTE — Progress Notes (Signed)
Patient ID: [name], female   DOB: 07-29-1951, 64 y.o.   MRN: [id number]    PCP: [name], NP  No Known Allergies  Chief Complaint  Patient presents with  . Hospitalization Follow-up     HPI: Patient is a 64 y.o. female seen in the office today for hospital follow up. Pt with pmh of  primary hyperparathyroidism, HTN, glaucoma, HLD and schizophrenia; pt was hospitalized after elevated calcium secondary to hyperparathyroidism. Thought to be non-complaince issues from recent hospitalization. Pt here today very confused over medications.  States she is taking potassium, reports hospital told her to take this however not on her medication list on discharge.  Has not taking magnesium. Has appt with Dr Gerrit Friends on June 22.  Pt reports increased fatigue, reports she is not able to get the things done around the house like she could a few days ago. Taking frequent naps. Has hx of increased urination. Denies any changes in bowel. Denies paresthesia. Review of Systems:  Review of Systems  Constitutional: Positive for activity change and fatigue. Negative for fever, chills, appetite change and unexpected weight change.       Increased fatigue since hospitalization   Eyes: Negative for visual disturbance.  Respiratory: Negative for cough, chest tightness and shortness of breath.   Cardiovascular: Negative for chest pain, palpitations and leg swelling.  Gastrointestinal: Negative for nausea, abdominal pain, diarrhea, constipation and abdominal distention.  Skin: Negative for pallor and rash.  Neurological: Positive for weakness. Negative for dizziness, light-headedness and headaches.  Psychiatric/Behavioral: Negative for suicidal ideas and sleep disturbance.    Past Medical History  Diagnosis Date  . Hyperparathyroidism   . Hypertension   . Osteopenia   . Vitamin D deficiency   . Cough   . Other abnormal blood chemistry   . Chlamydia trachomatis infection of unspecified  genitourinary site 12/18/2011  . Disorder of bone and cartilage, unspecified   . Edema   . Tobacco use disorder   . Tachycardia, unspecified 05/29/2010  . Unspecified glaucoma   . Hypercalcemia 02/14/2015  . Hyperlipemia   . Schizophrenia     "little bit" (02/23/2015)   Past Surgical History  Procedure Laterality Date  . No past surgeries     Social History:   reports that she has been smoking Cigarettes.  She has a 5.64 pack-year smoking history. She has never used smokeless tobacco. She reports that she drinks alcohol. She reports that she does not use illicit drugs.  Family History  Problem Relation Age of Onset  . Hypertension Other   . Diabetes Other   . Cancer Mother   . Cancer Brother     Throat    Medications: Patient's Medications  New Prescriptions   No medications on file  Previous Medications   ALENDRONATE (FOSAMAX) 70 MG TABLET    Take 70 mg by mouth once a week.   ATENOLOL (TENORMIN) 50 MG TABLET    TAKE 1 TABLET BY MOUTH EVERY DAY   ATORVASTATIN (LIPITOR) 20 MG TABLET    Take 1 tablet (20 mg total) by mouth daily.   CINACALCET (SENSIPAR) 30 MG TABLET    Take 1 tablet (30 mg total) by mouth 2 (two) times daily with a meal.   DORZOLAMIDE (TRUSOPT) 2 % OPHTHALMIC SOLUTION    Place 1 drop into both eyes 3 (three) times daily.   ENALAPRIL (VASOTEC) 10 MG TABLET    TAKE 1 TABLET BY MOUTH DAILY   LATANOPROST (XALATAN) 0.005 % OPHTHALMIC  SOLUTION    Place 1 drop into both eyes at bedtime.   LUMIGAN 0.01 % SOLN    Place 1 drop into both eyes at bedtime.   NEOMYCIN-BACITRACIN-POLYMYXIN (NEOSPORIN ORIGINAL EX)    Apply 1 application topically 4 (four) times daily as needed (For skin irritation).   POTASSIUM CHLORIDE SA (K-DUR,KLOR-CON) 20 MEQ TABLET    Take 40 mEq by mouth daily.   THIORIDAZINE (MELLARIL) 50 MG TABLET    TAKE 1 OR 2 TABLETS BY MOUTH AT BEDTIME  Modified Medications   Modified Medication Previous Medication   MAGNESIUM GLUCONATE (MAGONATE) 500 MG TABLET  magnesium gluconate (MAGONATE) 500 MG tablet      Take 1 tablet (500 mg total) by mouth daily.    Take 1 tablet (500 mg total) by mouth daily.  Discontinued Medications   No medications on file     Physical Exam:  Filed Vitals:   03/08/15 1652  BP: 120/60  Pulse: 101  Temp: 98.3 F (36.8 C)  TempSrc: Oral  Resp: 18  Height:  (1.575 m)  Weight: 144 lb 9.6 oz (65.59 kg)  SpO2: 96%    Physical Exam  Constitutional: No distress.  Easily confused, repeating herself   Neck: Normal range of motion. Neck supple.  Cardiovascular: Normal rate, regular rhythm and normal heart sounds.   Pulmonary/Chest: Effort normal and breath sounds normal.  Abdominal: Soft. Bowel sounds are normal.  Musculoskeletal: She exhibits no edema or tenderness.  Neurological: She is alert.  Skin: Skin is warm and dry. She is not diaphoretic.  Psychiatric: She has a normal mood and affect.    Labs reviewed: Basic Metabolic Panel:  Recent Labs  16/10/96 2007 02/24/15 0301 02/25/15 0455 02/25/15 1338 02/26/15 0430 03/01/15 0245  NA  --  141 141  --  141 144  K  --  4.4 3.7  --  3.3* 4.3  CL  --  114* 115*  --  112* 109*  CO2  --  19* 21*  --  23 21  GLUCOSE  --  75 102*  --  94 114*  BUN  --  9 5*  --  <5* 5*  CREATININE  --  1.05* 1.08*  --  1.15* 1.00  CALCIUM  --  12.7* 11.9* 12.3* 11.6* 12.4*  MG 1.0* 1.5* 1.2*  --  1.4*  --   PHOS 2.7  --   --   --   --   --    Liver Function Tests:  Recent Labs  02/23/15 1259 02/25/15 0455 02/26/15 0430  AST ALT ALKPHOS 54 43 45  BILITOT 0.5 0.5 0.4  PROT 7.8 5.4* 5.9*  ALBUMIN 4.3 3.2* 3.1*   No results for input(s): LIPASE, AMYLASE in the last 8760 hours. No results for input(s): AMMONIA in the last 8760 hours. CBC:  Recent Labs  02/14/15 1041  02/16/15 0639 02/23/15 1254 02/25/15 0455  WBC 8.2  < > 6.6 7.0 5.7  NEUTROABS 4.7  --   --  4.0  --   HGB 14.0  < > 12.1 13.2 10.8*  HCT 42.3  < > 38.0 40.9 33.0*   MCV 92.6  < > 92.5 92.7 91.9  PLT 210  < > 178 201 177  < > = values in this interval not displayed. Lipid Panel:  Recent Labs  07/18/14 1253  CHOL 145  HDL 41  LDLCALC 61  TRIG 216*  CHOLHDL 3.5  TSH: No results for input(s): TSH in the last 8760 hours. A1C: Lab Results  Component Value Date   HGBA1C 5.7* 07/18/2014     Assessment/Plan 1. Hyperparathyroidism, primary -followed by Dr Talmage Nap, with 2 admission to [facility] hospital due to hypercalcemia, pt placed on cinacalcet and magnesium gluconate -after recent admission Dr Willeen Cass office set her up with an appt Dr Gerrit Friends for surgery consult.  -after reviewing lab work, pt with increased fatigue and previous conversation with Dr Talmage Nap and conversation with admission team hospitalist will sent to [facility] for admission on tele-floor.   2. Hypercalcemia -pts calcium was 11.6 on discharge from the hospital (10 days ago), 6 days ago calcium had increased to 12.4, Dr Willeen Cass office notified.  -at this time due to severe fatigue and increase in calcium on lab work will have pt directly admitted [facility].  -hopefully surgery can be consulted there since she has not stayed out of the hospital long enough to make appt.

## 2015-03-09 ENCOUNTER — Encounter (HOSPITAL_COMMUNITY): Payer: Self-pay

## 2015-03-09 ENCOUNTER — Inpatient Hospital Stay (HOSPITAL_COMMUNITY): Payer: Medicare Other

## 2015-03-09 DIAGNOSIS — N179 Acute kidney failure, unspecified: Secondary | ICD-10-CM

## 2015-03-09 DIAGNOSIS — I10 Essential (primary) hypertension: Secondary | ICD-10-CM

## 2015-03-09 LAB — BASIC METABOLIC PANEL
ANION GAP: 6 (ref 5–15)
BUN: 9 mg/dL (ref 6–20)
CALCIUM: 12.7 mg/dL — AB (ref 8.9–10.3)
CO2: 23 mmol/L (ref 22–32)
Chloride: 113 mmol/L — ABNORMAL HIGH (ref 101–111)
Creatinine, Ser: 1.2 mg/dL — ABNORMAL HIGH (ref 0.44–1.00)
GFR calc Af Amer: 55 mL/min — ABNORMAL LOW (ref >60)
GFR, EST NON AFRICAN AMERICAN: 47 mL/min — AB (ref >60)
GLUCOSE: 82 mg/dL (ref 65–99)
POTASSIUM: 4.2 mmol/L (ref 3.5–5.1)
Sodium: 142 mmol/L (ref 135–145)

## 2015-03-09 LAB — MAGNESIUM: Magnesium: 1.4 mg/dL — ABNORMAL LOW (ref 1.7–2.4)

## 2015-03-09 MED ORDER — CALCITONIN (SALMON) 200 UNIT/ML IJ SOLN
50.0000 [IU] | Freq: Every day | INTRAMUSCULAR | Status: AC
  Administered 2015-03-09 – 2015-03-10 (×2): 50 [IU] via SUBCUTANEOUS
  Filled 2015-03-09 (×2): qty 0.25

## 2015-03-09 MED ORDER — IOHEXOL 300 MG/ML  SOLN
80.0000 mL | Freq: Once | INTRAMUSCULAR | Status: AC | PRN
Start: 2015-03-09 — End: 2015-03-09
  Administered 2015-03-09: 80 mL via INTRAVENOUS

## 2015-03-09 MED ORDER — SODIUM CHLORIDE 0.9 % IV SOLN
INTRAVENOUS | Status: DC
  Administered 2015-03-09 – 2015-03-16 (×14): via INTRAVENOUS

## 2015-03-09 MED ORDER — FUROSEMIDE 10 MG/ML IJ SOLN
20.0000 mg | Freq: Every day | INTRAMUSCULAR | Status: DC
  Administered 2015-03-10 – 2015-03-14 (×5): 20 mg via INTRAVENOUS
  Filled 2015-03-09 (×6): qty 2

## 2015-03-09 MED ORDER — PANTOPRAZOLE SODIUM 40 MG PO TBEC
40.0000 mg | DELAYED_RELEASE_TABLET | Freq: Every day | ORAL | Status: DC
  Administered 2015-03-10 – 2015-03-16 (×6): 40 mg via ORAL
  Filled 2015-03-09 (×7): qty 1

## 2015-03-09 NOTE — Progress Notes (Signed)
Patient ID: [name], female   DOB: [birth year], 64 y.o.   MRN: [id number] Will discuss with Dr Gerrit Friends any plan for surgery as he has seen her before.  Discussed this with patient and Dr Gwenlyn Perking

## 2015-03-09 NOTE — Progress Notes (Signed)
Utilization review completed.  

## 2015-03-09 NOTE — Progress Notes (Signed)
[facility] PROGRESS NOTE  [name] BRA:[id number] DOB: July 08, 1951 DOA: 03/08/2015 PCP: [name], NP  Assessment/Plan: 1-Hypercalcemia: patient presented with polyuria, dehydration, some confusion and extreme fatigue -found to have calcium 14.7 on admisison -IV resuscitation initiated and will follow with lasix -will give 2 doses of calcitonin -continue sensipar -will follow Mg and phosphorus -follow BMET in am  2-Hyperparathyroidism, primary: patient has had now 3 admissions in the last month secondary to hypercalcemia. Per PCP family has been helping with medication and she has actively follow with her  Endocrinologist. -Calcium on admission 14.7 -Per Dr. Willeen Cass rec's [facility] has been contacted and Dr. Gerrit Friends has recommended CT neck with contrast  -will also continue sensipar  3-Schizophrenia: currently oriented X 3 and denies any hallucinations  -will continue mellaril  4-glaucoma: will continue xalatan and trusopt  5-GERD (gastroesophageal reflux disease): continue protonix  6-Essential hypertension: will continue home medication regimen, except lisinopril given AKI -patient started on heart healthy diet -Blood pressure is soft at this moment we'll follow closely and make adjustment to her antihypertensive regimen as needed.  7-AKI: due to continue use of lisinopril and dehydration from hypercalcemia -Will place ACE inhibitor on hold -Continue IV fluid resuscitation -will follow renal function  8-hypomagnasemia and hypokalemia: will replete as needed and follow electrolytes  Code Status: Full Family Communication: Sister ([name] by phone, [phone]) Disposition Plan: To be determined; remain as inpatient for now. Patient is still with complaints of general fatigue and signs of dehydration/renal insufficiency   Consultants:  CCS  Procedures:  CT neck with contrast for further evaluation parathyroid glands    Antibiotics:  None  HPI/Subjective: Patient feeling better. Denies chest pain, shortness of breath. No nausea or vomiting  Objective: Filed Vitals:   03/09/15 1315  BP: 111/56  Pulse: 71  Temp: 97.6 F (36.4 C)  Resp: 20    Intake/Output Summary (Last 24 hours) at 03/09/15 1539 Last data filed at 03/09/15 1346  Gross per 24 hour  Intake    582 ml  Output   4350 ml  Net  -3768 ml   Filed Weights   03/08/15 1805 03/09/15 0602  Weight: 64.4 kg (141 lb 15.6 oz) 67.4 kg (148 lb 9.4 oz)    Exam:   General:  Afebrile, feeling better and currently alert, awake and oriented 3; denies nausea, vomiting, chest pain and shortness of breath.  Cardiovascular: S1 and S2, no rubs, no gallops  Respiratory: Clear to auscultation bilaterally  Abdomen: Soft, nontender, nondistended, positive bowel sounds  Musculoskeletal: No edema, no cyanosis; no clubbing  Data Reviewed: Basic Metabolic Panel:  Recent Labs Lab 03/08/15 2046 03/09/15 0419  NA 139 142  K 4.2 4.2  CL 108 113*  CO2 24 23  GLUCOSE 103* 82  BUN 9 9  CREATININE 1.27* 1.20*  CALCIUM 14.4* 12.7*  MG 0.8* 1.4*  PHOS 2.7  --    Liver Function Tests:  Recent Labs Lab 03/08/15 2046  AST 24  ALT 21  ALKPHOS 48  BILITOT 0.6  PROT 6.7  ALBUMIN 4.0   CBC:  Recent Labs Lab 03/08/15 2046  WBC 6.9  NEUTROABS 4.0  HGB 11.9*  HCT 37.0  MCV 92.3  PLT 172    Studies: X-ray Chest Pa And Lateral  03/08/2015   CLINICAL DATA:  Weakness, fatigue for 1 day  EXAM: CHEST  2 VIEW  COMPARISON:  None.  FINDINGS: The heart size and mediastinal contours are within normal limits. Both lungs are  clear. The visualized skeletal structures are unremarkable.  IMPRESSION: No active cardiopulmonary disease.   Electronically Signed   By: Elige Ko   On: 03/08/2015 20:01    Scheduled Meds: . atenolol  50 mg Oral Daily  . atorvastatin  20 mg Oral Daily  . calcitonin  50 Units Subcutaneous Daily  . cinacalcet  30 mg  Oral BID WC  . dorzolamide  1 drop Both Eyes TID  . enoxaparin (LOVENOX) injection  40 mg Subcutaneous Q24H  . [START ON 03/10/2015] furosemide  20 mg Intravenous Daily  . latanoprost  1 drop Both Eyes QHS  . magnesium oxide  400 mg Oral Daily  . potassium chloride SA  40 mEq Oral Daily  . sodium chloride  3 mL Intravenous Q12H   Continuous Infusions: . sodium chloride 125 mL/hr at 03/09/15 1306    Principal Problem:   Hyperparathyroidism, primary Active Problems:   Schizophrenia   Essential hypertension   Hypercalcemia   Weak   Time spent: 35 minutes   Vassie Loll  [facility] Pager [phone]. If 7PM-7AM, please contact night-coverage at [email or web address], password [facility] 03/09/2015, 3:39 PM  LOS: 1 day

## 2015-03-10 DIAGNOSIS — R531 Weakness: Secondary | ICD-10-CM

## 2015-03-10 DIAGNOSIS — E876 Hypokalemia: Secondary | ICD-10-CM

## 2015-03-10 LAB — BASIC METABOLIC PANEL
Anion gap: 4 — ABNORMAL LOW (ref 5–15)
BUN: 6 mg/dL (ref 6–20)
CO2: 20 mmol/L — AB (ref 22–32)
Calcium: 10.9 mg/dL — ABNORMAL HIGH (ref 8.9–10.3)
Chloride: 115 mmol/L — ABNORMAL HIGH (ref 101–111)
Creatinine, Ser: 0.87 mg/dL (ref 0.44–1.00)
GFR calc Af Amer: >60 mL/min (ref >60)
GFR calc non Af Amer: >60 mL/min (ref >60)
Glucose, Bld: 90 mg/dL (ref 65–99)
POTASSIUM: 3.8 mmol/L (ref 3.5–5.1)
Sodium: 139 mmol/L (ref 135–145)

## 2015-03-10 LAB — MAGNESIUM: Magnesium: 1 mg/dL — ABNORMAL LOW (ref 1.7–2.4)

## 2015-03-10 MED ORDER — ENOXAPARIN SODIUM 40 MG/0.4ML ~~LOC~~ SOLN
40.0000 mg | SUBCUTANEOUS | Status: DC
  Administered 2015-03-10 – 2015-03-13 (×4): 40 mg via SUBCUTANEOUS
  Filled 2015-03-10 (×5): qty 0.4

## 2015-03-10 MED ORDER — MAGNESIUM SULFATE 2 GM/50ML IV SOLN
2.0000 g | Freq: Once | INTRAVENOUS | Status: AC
  Administered 2015-03-10: 2 g via INTRAVENOUS
  Filled 2015-03-10: qty 50

## 2015-03-10 NOTE — Progress Notes (Signed)
[facility] PROGRESS NOTE  [name] ZOX:[id number] DOB: 29-Jul-1951 DOA: 03/08/2015 PCP: [name], NP  Assessment/Plan: 1-Hypercalcemia: patient presented with polyuria, dehydration, some confusion and extreme fatigue -found to have calcium 14.7 on admisison -IV resuscitation initiated and will follow with lasix -will give 2 doses of calcitonin (last dose of calcium to be given today) -continue sensipar -will follow Mg, K and phosphorus and replete as needed  -follow BMET in am -current calcium level 10.9  2-Hyperparathyroidism, primary: patient has had now 3 admissions in the last month secondary to hypercalcemia. Per PCP family has been helping with medication and she has actively follow with her  Endocrinologist. -Calcium on admission 14.7 -Per Dr. Willeen Cass rec's [facility] has been contacted and Dr. Gerrit Friends has recommended CT neck with contrast; results are back and will discussed with him after he reviewed images results, in order to determine further rec's -hopefully surgery can be done during this admission  -will also continue sensipar  3-Schizophrenia: currently oriented X 3 and denies any hallucinations  -will continue mellaril  4-glaucoma: will continue xalatan and trusopt  5-GERD (gastroesophageal reflux disease): continue protonix  6-Essential hypertension: will continue home medication regimen, except lisinopril given AKI -patient started on heart healthy diet -Blood pressure is soft at this moment will follow closely and make adjustment to her antihypertensive regimen as needed.  7-AKI: due to continue use of lisinopril and dehydration from hypercalcemia -Will continue holding ACE inhibitor  -Continue IV fluids -renal function back to normal -will monitor   8-hypomagnasemia and hypokalemia: will replete as needed and follow electrolytes -Mg 1.0 on 6/18 -K 3.8 on 6/18  Code Status: Full Family Communication: Brother ([name]  [phone]) Disposition Plan: To be determined; remain as inpatient for now. Patient is still with complaints of general fatigue and signs of dehydration/renal insufficiency   Consultants:  CCS  Procedures:  CT neck with contrast for further evaluation parathyroid glands: 1. Triangular enhancing soft tissue superior to the left thyroid lobe suspicious for parathyroid adenoma in this setting.  Antibiotics:  None  HPI/Subjective: Patient feeling a lot better, no CP, no SOB, no nausea and no vomiting. Patient is afebrile. Calcium 10.9  Objective: Filed Vitals:   03/10/15 1343  BP: 117/62  Pulse: 75  Temp: 98.2 F (36.8 C)  Resp: 16    Intake/Output Summary (Last 24 hours) at 03/10/15 1522 Last data filed at 03/10/15 1400  Gross per 24 hour  Intake 4309.17 ml  Output   2650 ml  Net 1659.17 ml   Filed Weights   03/08/15 1805 03/09/15 0602 03/10/15 0258  Weight: 64.4 kg (141 lb 15.6 oz) 67.4 kg (148 lb 9.4 oz) 66.769 kg (147 lb 3.2 oz)    Exam:   General:  Afebrile, feeling a lot better, AAOX3 and in no distress.  Cardiovascular: S1 and S2, no rubs, no gallops  Respiratory: Clear to auscultation bilaterally  Abdomen: Soft, nontender, nondistended, positive bowel sounds  Musculoskeletal: No edema, no cyanosis; no clubbing  Data Reviewed: Basic Metabolic Panel:  Recent Labs Lab 03/08/15 2046 03/09/15 0419 03/10/15 0438  NA 139 142 139  K 4.2 4.2 3.8  CL 108 113* 115*  CO2 24 23 20*  GLUCOSE 103* 82 90  BUN CREATININE 1.27* 1.20* 0.87  CALCIUM 14.4* 12.7* 10.9*  MG 0.8* 1.4* 1.0*  PHOS 2.7  --   --    Liver Function Tests:  Recent Labs Lab 03/08/15 2046  AST 24  ALT  21  ALKPHOS 48  BILITOT 0.6  PROT 6.7  ALBUMIN 4.0   CBC:  Recent Labs Lab 03/08/15 2046  WBC 6.9  NEUTROABS 4.0  HGB 11.9*  HCT 37.0  MCV 92.3  PLT 172    Studies: X-ray Chest Pa And Lateral  03/08/2015   CLINICAL DATA:  Weakness, fatigue for 1 day  EXAM:  CHEST  2 VIEW  COMPARISON:  None.  FINDINGS: The heart size and mediastinal contours are within normal limits. Both lungs are clear. The visualized skeletal structures are unremarkable.  IMPRESSION: No active cardiopulmonary disease.   Electronically Signed   By: Elige Ko   On: 03/08/2015 20:01   Ct Soft Tissue Neck W Contrast  03/09/2015   CLINICAL DATA:  64 year old female with hyperparathyroidism. Hypercalcemia. Query abnormal parathyroid gland. Initial encounter.  EXAM: CT NECK WITH CONTRAST  TECHNIQUE: Multidetector CT imaging of the neck was performed using the standard protocol following the bolus administration of intravenous contrast.  CONTRAST:  75mL OMNIPAQUE IOHEXOL 300 MG/ML  SOLN  COMPARISON:  Nuclear medicine sestamibi parathyroid scan 04/23/2011  FINDINGS: Pharynx and larynx: Negative larynx. Pharyngeal soft tissue contours are within normal limits. Parapharyngeal spaces and retropharyngeal space are within normal limits.  Salivary glands: Submandibular and parotid glands are within normal limits. Negative sublingual space.  Thyroid: Multiple subcentimeter hypodense thyroid nodules, the largest are 5 mm and do not meet size criteria for ultrasound follow-up.  Nearly isodense to the thyroid parenchyma but appearing somewhat distinct from the superior left pole and extending farther cephalad than expected for thyroid parenchyma there is a triangular 6 x 13 by 24 mm soft tissue focus along the lateral aspect of the left retropharyngeal space, a immediately medial to the left common carotid artery. See coronal image 53 and series 2, image 59. This is suspicious for parathyroid tissue.  Lymph nodes: No lymphadenopathy. Cervical lymph nodes are within normal limits.  Vascular: Major vascular structures in the neck and at the skullbase are patent. Mild soft and calcified atherosclerotic plaque at the carotid bifurcations. Calcified Coronary artery atherosclerosis is partially visible (series 2, image  129.  Limited intracranial: Negative.  Visualized orbits: Negative.  Mastoids and visualized paranasal sinuses: Negative.  Skeleton: Carious dentition. Elongated left greater than right stylohyoid ligament calcification. Widespread cervical disc degeneration. Calcified ligamentous hypertrophy about the odontoid. No acute osseous abnormality identified.  Upper chest: Dependent pulmonary atelectasis. Right upper lobe bronchiectasis. No superior mediastinal lymphadenopathy. No superior mediastinal nodules suspicious for parathyroid tissue.  IMPRESSION: 1. Triangular enhancing soft tissue superior to the left thyroid lobe suspicious for parathyroid adenoma in this setting. Recommend repeat Nuclear Medicine Sestamibi scan with SPECT and attention to this area. 2. Otherwise negative neck and superior mediastinal soft tissues. 3. Calcified Coronary artery atherosclerosis.  Carious dentition.   Electronically Signed   By: Odessa Fleming M.D.   On: 03/09/2015 18:50    Scheduled Meds: . atenolol  50 mg Oral Daily  . atorvastatin  20 mg Oral Daily  . cinacalcet  30 mg Oral BID WC  . dorzolamide  1 drop Both Eyes TID  . enoxaparin (LOVENOX) injection  40 mg Subcutaneous Q24H  . furosemide  20 mg Intravenous Daily  . latanoprost  1 drop Both Eyes QHS  . magnesium oxide  400 mg Oral Daily  . pantoprazole  40 mg Oral Q1200  . potassium chloride SA  40 mEq Oral Daily  . sodium chloride  3 mL Intravenous Q12H   Continuous Infusions: .  sodium chloride 125 mL/hr at 03/10/15 1610    Principal Problem:   Hyperparathyroidism, primary Active Problems:   Schizophrenia   Essential hypertension   Hypercalcemia   Weak   Time spent: 35 minutes   Vassie Loll  [facility] Pager [phone]. If 7PM-7AM, please contact night-coverage at [email or web address], password [facility] 03/10/2015, 3:22 PM  LOS: 2 days

## 2015-03-11 ENCOUNTER — Ambulatory Visit: Payer: Self-pay | Admitting: Surgery

## 2015-03-11 LAB — MAGNESIUM: MAGNESIUM: 1.2 mg/dL — AB (ref 1.7–2.4)

## 2015-03-11 LAB — BASIC METABOLIC PANEL
Anion gap: 7 (ref 5–15)
BUN: <5 mg/dL — ABNORMAL LOW (ref 6–20)
CALCIUM: 10.7 mg/dL — AB (ref 8.9–10.3)
CO2: 19 mmol/L — ABNORMAL LOW (ref 22–32)
Chloride: 115 mmol/L — ABNORMAL HIGH (ref 101–111)
Creatinine, Ser: 0.76 mg/dL (ref 0.44–1.00)
Glucose, Bld: 106 mg/dL — ABNORMAL HIGH (ref 65–99)
Potassium: 3.3 mmol/L — ABNORMAL LOW (ref 3.5–5.1)
Sodium: 141 mmol/L (ref 135–145)

## 2015-03-11 LAB — CBC
HCT: 31.6 % — ABNORMAL LOW (ref 36.0–46.0)
Hemoglobin: 10.3 g/dL — ABNORMAL LOW (ref 12.0–15.0)
MCH: 29.7 pg (ref 26.0–34.0)
MCHC: 32.6 g/dL (ref 30.0–36.0)
MCV: 91.1 fL (ref 78.0–100.0)
Platelets: 157 K/uL (ref 150–400)
RBC: 3.47 MIL/uL — ABNORMAL LOW (ref 3.87–5.11)
RDW: 12.5 % (ref 11.5–15.5)
WBC: 6.2 K/uL (ref 4.0–10.5)

## 2015-03-11 LAB — PHOSPHORUS: Phosphorus: 2.2 mg/dL — ABNORMAL LOW (ref 2.5–4.6)

## 2015-03-11 MED ORDER — MAGNESIUM SULFATE 2 GM/50ML IV SOLN
2.0000 g | Freq: Once | INTRAVENOUS | Status: AC
  Administered 2015-03-11: 2 g via INTRAVENOUS
  Filled 2015-03-11: qty 50

## 2015-03-11 MED ORDER — DEXTROSE 5 % IV SOLN
20.0000 meq | Freq: Once | INTRAVENOUS | Status: AC
  Administered 2015-03-11: 20 meq via INTRAVENOUS
  Filled 2015-03-11 (×2): qty 4.55

## 2015-03-11 MED ORDER — CINACALCET HCL 30 MG PO TABS
60.0000 mg | ORAL_TABLET | Freq: Two times a day (BID) | ORAL | Status: DC
  Administered 2015-03-11 – 2015-03-13 (×5): 60 mg via ORAL
  Filled 2015-03-11 (×8): qty 2

## 2015-03-11 MED ORDER — THIORIDAZINE HCL 100 MG PO TABS
100.0000 mg | ORAL_TABLET | Freq: Once | ORAL | Status: AC
Start: 2015-03-11 — End: 2015-03-11
  Administered 2015-03-11: 100 mg via ORAL
  Filled 2015-03-11 (×2): qty 1

## 2015-03-11 NOTE — Plan of Care (Signed)
Problem: Phase III Progression Outcomes Goal: Foley discontinued Outcome: Not Applicable Date Met:  69/45/03 Pt voids, has no foley

## 2015-03-11 NOTE — Progress Notes (Signed)
General Surgery [facility]  Patient known to me.  Last seen in office 2012.  Negative nuclear med sestamibi scan ordered by Dr. Talmage Nap this spring.  Discussed with general surgery in-patient service and radiology last week.  CT scan of neck now localizes a suspicious mass lesion in the left superior position possibly representing a parathyroid adenoma.  Continue medical management of hypercalcemia and other concurrent issues.  I will try to schedule for OR for neck exploration and parathyroidectomy on Thursday afternoon, June 23rd.  I will see patient at hospital tomorrow.  Velora Heckler, MD, [facility] Office: [phone]

## 2015-03-11 NOTE — Progress Notes (Signed)
[facility] PROGRESS NOTE  [name] AVW:[id number] DOB: [birth year] DOA: 03/08/2015 PCP: [name], NP  Assessment/Plan: 1-Hypercalcemia: patient presented with polyuria, dehydration, some confusion and extreme fatigue -found to have calcium 14.7 on admisison -IV resuscitation initiated and will follow with lasix -continue sensipar (now  BID) -will follow Mg, K and phosphorus and replete them as needed  -follow BMET in am -current calcium level 10.7  2-Hyperparathyroidism, primary: patient has had now 3 admissions in the last month secondary to hypercalcemia. Per PCP family has been helping with medication and she has actively follow with her  Endocrinologist. -Calcium on admission 14.7 -Per Dr. Willeen Cass rec's [facility] has been contacted and Dr. Gerrit Friends has recommended CT neck with contrast; after discussing with him plan is for surgery on 6/23. Will check PTH and he will see patient in am to answer any questions about the procedure -hopefully surgery can be done during this admission  -will also continue sensipar (  BID)  3-Schizophrenia: currently oriented X 3 and denies any hallucinations  -will continue mellaril  4-glaucoma: will continue xalatan and trusopt  5-GERD (gastroesophageal reflux disease): continue protonix  6-Essential hypertension: will continue home medication regimen, except lisinopril given AKI -patient started on heart healthy diet -Blood pressure is soft at this moment will follow closely and make adjustment to her antihypertensive regimen as needed.  7-AKI: due to continue use of lisinopril and dehydration from hypercalcemia -Will continue holding ACE inhibitor  -Continue IV fluids -renal function back to normal -will monitor   8-hypomagnasemia and hypokalemia: will replete as needed and follow electrolytes -Mg 1.2 on 6/19 -K 3.3 on 6/19  Code Status: Full Family Communication: Brother ([name] [phone]) Disposition  Plan: To be determined; remain as inpatient for now. Patient is still with complaints of general fatigue and signs of dehydration/renal insufficiency   Consultants:  CCS  Procedures:  CT neck with contrast for further evaluation parathyroid glands: 1. Triangular enhancing soft tissue superior to the left thyroid lobe suspicious for parathyroid adenoma in this setting.  Antibiotics:  None  HPI/Subjective: Patient feeling a lot better, no CP, no SOB, no nausea and no vomiting. Patient has remains afebrile. Calcium 10.7 today. There is also low potassium, phosphorus and Mg  Objective: Filed Vitals:   03/11/15 0542  BP: 117/53  Pulse: 78  Temp: 98.5 F (36.9 C)  Resp: 18    Intake/Output Summary (Last 24 hours) at 03/11/15 1249 Last data filed at 03/11/15 1022  Gross per 24 hour  Intake 3657.87 ml  Output    860 ml  Net 2797.87 ml   Filed Weights   03/09/15 0602 03/10/15 0258 03/11/15 0558  Weight: 67.4 kg (148 lb 9.4 oz) 66.769 kg (147 lb 3.2 oz) 66.86 kg (147 lb 6.4 oz)    Exam:   General:  Afebrile, feeling a lot better, AAOX3 and in no distress. Calcium is stable at 10 range; but still with low Mg, PO4 and potassium  Cardiovascular: S1 and S2, no rubs, no gallops  Respiratory: Clear to auscultation bilaterally  Abdomen: Soft, nontender, nondistended, positive bowel sounds  Musculoskeletal: No edema, no cyanosis; no clubbing  Data Reviewed: Basic Metabolic Panel:  Recent Labs Lab 03/08/15 2046 03/09/15 0419 03/10/15 0438 03/11/15 0300  NA 139 142 139 141  K 4.2 4.2 3.8 3.3*  CL 108 113* 115* 115*  CO2 24 23 20* 19*  GLUCOSE 103* 82 90 106*  BUN <5*  CREATININE 1.27* 1.20* 0.87 0.76  CALCIUM 14.4* 12.7* 10.9* 10.7*  MG 0.8* 1.4* 1.0* 1.2*  PHOS 2.7  --   --  2.2*   Liver Function Tests:  Recent Labs Lab 03/08/15 2046  AST 24  ALT 21  ALKPHOS 48  BILITOT 0.6  PROT 6.7  ALBUMIN 4.0   CBC:  Recent Labs Lab 03/08/15 2046  03/11/15 0300  WBC 6.9 6.2  NEUTROABS 4.0  --   HGB 11.9* 10.3*  HCT 37.0 31.6*  MCV 92.3 91.1  PLT 172 157    Studies: X-ray Chest Pa And Lateral  03/08/2015   CLINICAL DATA:  Weakness, fatigue for 1 day  EXAM: CHEST  2 VIEW  COMPARISON:  None.  FINDINGS: The heart size and mediastinal contours are within normal limits. Both lungs are clear. The visualized skeletal structures are unremarkable.  IMPRESSION: No active cardiopulmonary disease.   Electronically Signed   By: Elige Ko   On: 03/08/2015 20:01   Ct Soft Tissue Neck W Contrast  03/09/2015   CLINICAL DATA:  64 year old female with hyperparathyroidism. Hypercalcemia. Query abnormal parathyroid gland. Initial encounter.  EXAM: CT NECK WITH CONTRAST  TECHNIQUE: Multidetector CT imaging of the neck was performed using the standard protocol following the bolus administration of intravenous contrast.  CONTRAST:  80mL OMNIPAQUE IOHEXOL 300 MG/ML  SOLN  COMPARISON:  Nuclear medicine sestamibi parathyroid scan 04/23/2011  FINDINGS: Pharynx and larynx: Negative larynx. Pharyngeal soft tissue contours are within normal limits. Parapharyngeal spaces and retropharyngeal space are within normal limits.  Salivary glands: Submandibular and parotid glands are within normal limits. Negative sublingual space.  Thyroid: Multiple subcentimeter hypodense thyroid nodules, the largest are 5 mm and do not meet size criteria for ultrasound follow-up.  Nearly isodense to the thyroid parenchyma but appearing somewhat distinct from the superior left pole and extending farther cephalad than expected for thyroid parenchyma there is a triangular 6 x 13 by 24 mm soft tissue focus along the lateral aspect of the left retropharyngeal space, a immediately medial to the left common carotid artery. See coronal image 53 and series 2, image 59. This is suspicious for parathyroid tissue.  Lymph nodes: No lymphadenopathy. Cervical lymph nodes are within normal limits.  Vascular:  Major vascular structures in the neck and at the skullbase are patent. Mild soft and calcified atherosclerotic plaque at the carotid bifurcations. Calcified Coronary artery atherosclerosis is partially visible (series 2, image 129.  Limited intracranial: Negative.  Visualized orbits: Negative.  Mastoids and visualized paranasal sinuses: Negative.  Skeleton: Carious dentition. Elongated left greater than right stylohyoid ligament calcification. Widespread cervical disc degeneration. Calcified ligamentous hypertrophy about the odontoid. No acute osseous abnormality identified.  Upper chest: Dependent pulmonary atelectasis. Right upper lobe bronchiectasis. No superior mediastinal lymphadenopathy. No superior mediastinal nodules suspicious for parathyroid tissue.  IMPRESSION: 1. Triangular enhancing soft tissue superior to the left thyroid lobe suspicious for parathyroid adenoma in this setting. Recommend repeat Nuclear Medicine Sestamibi scan with SPECT and attention to this area. 2. Otherwise negative neck and superior mediastinal soft tissues. 3. Calcified Coronary artery atherosclerosis.  Carious dentition.   Electronically Signed   By: Odessa Fleming M.D.   On: 03/09/2015 18:50    Scheduled Meds: . atenolol  50 mg Oral Daily  . atorvastatin  20 mg Oral Daily  . cinacalcet  60 mg Oral BID WC  . dorzolamide  1 drop Both Eyes TID  . enoxaparin (LOVENOX) injection  40 mg Subcutaneous Q24H  . furosemide  20 mg Intravenous Daily  .  latanoprost  1 drop Both Eyes QHS  . magnesium oxide  400 mg Oral Daily  . pantoprazole  40 mg Oral Q1200  . potassium chloride SA  40 mEq Oral Daily  . potassium phosphate IVPB (mEq)  20 mEq Intravenous Once  . sodium chloride  3 mL Intravenous Q12H   Continuous Infusions: . sodium chloride 125 mL/hr at 03/10/15 1927    Principal Problem:   Hyperparathyroidism, primary Active Problems:   Schizophrenia   Essential hypertension   Hypercalcemia   Weak   Time spent: 35  minutes   Vassie Loll  [facility] Pager [phone]. If 7PM-7AM, please contact night-coverage at [email or web address], password [facility] 03/11/2015, 12:49 PM  LOS: 3 days

## 2015-03-12 LAB — BASIC METABOLIC PANEL
ANION GAP: 7 (ref 5–15)
CALCIUM: 10 mg/dL (ref 8.9–10.3)
CO2: 22 mmol/L (ref 22–32)
Chloride: 111 mmol/L (ref 101–111)
Creatinine, Ser: 0.86 mg/dL (ref 0.44–1.00)
GFR calc Af Amer: >60 mL/min (ref >60)
Glucose, Bld: 99 mg/dL (ref 65–99)
Potassium: 2.9 mmol/L — ABNORMAL LOW (ref 3.5–5.1)
Sodium: 140 mmol/L (ref 135–145)

## 2015-03-12 MED ORDER — POTASSIUM CHLORIDE CRYS ER 20 MEQ PO TBCR
40.0000 meq | EXTENDED_RELEASE_TABLET | Freq: Three times a day (TID) | ORAL | Status: DC
  Administered 2015-03-12 – 2015-03-14 (×8): 40 meq via ORAL
  Filled 2015-03-12 (×11): qty 2

## 2015-03-12 NOTE — Progress Notes (Signed)
  Subjective: No complaints  Objective: Vital signs in last 24 hours: Temp:  [98.2 F (36.8 C)-98.9 F (37.2 C)] 98.2 F (36.8 C) (06/20 0526) Pulse Rate:  [64-75] 67 (06/20 0543) Resp:  [18] 18 (06/20 0526) BP: (98-153)/(54-71) 121/63 mmHg (06/20 0543) SpO2:  [100 %] 100 % (06/20 0526) Weight:  [65.454 kg (144 lb 4.8 oz)] 65.454 kg (144 lb 4.8 oz) (06/20 0539) Last BM Date: 03/11/15  Intake/Output from previous day: 06/19 0701 - 06/20 0700 In: 3025 [P.O.:180; I.V.:2590.4; IV Piggyback:254.6] Out: 600 [Urine:600] Intake/Output this shift:    Neck: thyroid not enlarged, symmetric, no tenderness/mass/nodules Resp: clear to auscultation bilaterally Cardio: regular rate and rhythm GI: soft, non-tender; bowel sounds normal; no masses,  no organomegaly  Lab Results:   Recent Labs  03/11/15 0300  WBC 6.2  HGB 10.3*  HCT 31.6*  PLT 157   BMET  Recent Labs  03/11/15 0300 03/12/15 0632  NA 141 140  K 3.3* 2.9*  CL 115* 111  CO2 19* 22  GLUCOSE 106* 99  BUN <5* <5*  CREATININE 0.76 0.86  CALCIUM 10.7* 10.0   PT/INR No results for input([name]): LABPROT, INR in the last 72 hours. ABG No results for input([name]): PHART, HCO3 in the last 72 hours.  Invalid input([name]): PCO2, PO2  Studies/Results: No results found.  Anti-infectives: Anti-infectives    None      Assessment/Plan: [name]/p * No surgery found * Advance diet  Dr. Gerrit Friends plans for surgery Thursday afternoon. Will discuss with him Whether she needs to stay in hospital or not will be up to medical team.  K low. Will need to replace  LOS: 4 days    [name],[name] 03/12/2015

## 2015-03-12 NOTE — Progress Notes (Signed)
[facility] PROGRESS NOTE  [name] DUK:[id number] DOB: [birth year] DOA: 03/08/2015 PCP: [name], NP  Assessment/Plan: 1-Hypercalcemia: patient presented with polyuria, dehydration, some confusion and extreme fatigue -found to have calcium 14.7 on admisison -IV resuscitation initiated and will follow with lasix -continue sensipar (now 60mg  BID) -will follow Mg, K and phosphorus and replete them as needed  -follow BMET in am -current calcium level 10.7  2-Hyperparathyroidism, primary: patient has had now 3 admissions in the last month secondary to hypercalcemia. Per PCP family has been helping with medication and she has actively follow with her  Endocrinologist. -Calcium on admission 14.7 -current calcium level 10.0 -Per Dr. Willeen Cass rec's [facility] has been contacted and Dr. Gerrit Friends has recommended CT neck with contrast; after discussing with him plan is for surgery on 6/23.  -Will follow PTH level  -hopefully surgery can be done during this admission  -will also continue sensipar (60mg  BID)  3-Schizophrenia: currently oriented X 3 and denies any hallucinations  -will continue mellaril  4-glaucoma: will continue xalatan and trusopt  5-GERD (gastroesophageal reflux disease): continue protonix  6-Essential hypertension: will continue home medication regimen, except lisinopril given AKI -patient started on heart healthy diet -Blood pressure is soft at this moment will follow closely and make adjustment to her antihypertensive regimen as needed.  7-AKI: due to continue use of lisinopril and dehydration from hypercalcemia -Will continue holding ACE inhibitor  -Continue IV fluids -renal function back to normal -will monitor   8-hypomagnasemia and hypokalemia: will replete as needed and follow electrolytes -K 2.9 on 6/20  Code Status: Full Family Communication: Brother ([name] [phone]) Disposition Plan: To be determined; remain as inpatient for now.  Patient is still with complaints of general fatigue and signs of dehydration/renal insufficiency  Consultants:  CCS  Procedures:  CT neck with contrast for further evaluation parathyroid glands: 1. Triangular enhancing soft tissue superior to the left thyroid lobe suspicious for parathyroid adenoma in this setting.  Antibiotics:  None  HPI/Subjective: Patient feeling a lot better, no CP, no SOB, no nausea and no vomiting. Patient has remains afebrile. Calcium 10.0; planning surgery for Thursday afternoon (03/15/15)  Objective: Filed Vitals:   03/12/15 1013  BP: 125/68  Pulse:   Temp:   Resp:     Intake/Output Summary (Last 24 hours) at 03/12/15 1159 Last data filed at 03/12/15 1027  Gross per 24 hour  Intake 2362.5 ml  Output   1150 ml  Net 1212.5 ml   Filed Weights   03/10/15 0258 03/11/15 0558 03/12/15 0539  Weight: 66.769 kg (147 lb 3.2 oz) 66.86 kg (147 lb 6.4 oz) 65.454 kg (144 lb 4.8 oz)    Exam:   General:  Afebrile, AAOX3 and in no distress. Calcium is stable at 10.0 range; but still with low potassium (2.9)  Cardiovascular: S1 and S2, no rubs, no gallops  Respiratory: Clear to auscultation bilaterally  Abdomen: Soft, nontender, nondistended, positive bowel sounds  Musculoskeletal: No edema, no cyanosis; no clubbing  Data Reviewed: Basic Metabolic Panel:  Recent Labs Lab 03/08/15 2046 03/09/15 0419 03/10/15 0438 03/11/15 0300 03/12/15 0632  NA 139 142 139 141 140  K 4.2 4.2 3.8 3.3* 2.9*  CL 108 113* 115* 115* 111  CO2 24 23 20* 19* 22  GLUCOSE 103* 82 90 106* 99  BUN 9 9 6  <5* <5*  CREATININE 1.27* 1.20* 0.87 0.76 0.86  CALCIUM 14.4* 12.7* 10.9* 10.7* 10.0  MG 0.8* 1.4* 1.0* 1.2*  --  PHOS 2.7  --   --  2.2*  --    Liver Function Tests:  Recent Labs Lab 03/08/15 2046  AST 24  ALT 21  ALKPHOS 48  BILITOT 0.6  PROT 6.7  ALBUMIN 4.0   CBC:  Recent Labs Lab 03/08/15 2046 03/11/15 0300  WBC 6.9 6.2  NEUTROABS 4.0  --    HGB 11.9* 10.3*  HCT 37.0 31.6*  MCV 92.3 91.1  PLT 172 157    Studies: X-ray Chest Pa And Lateral  03/08/2015   CLINICAL DATA:  Weakness, fatigue for 1 day  EXAM: CHEST  2 VIEW  COMPARISON:  None.  FINDINGS: The heart size and mediastinal contours are within normal limits. Both lungs are clear. The visualized skeletal structures are unremarkable.  IMPRESSION: No active cardiopulmonary disease.   Electronically Signed   By: Elige Ko   On: 03/08/2015 20:01   Ct Soft Tissue Neck W Contrast  03/09/2015   CLINICAL DATA:  64 year old female with hyperparathyroidism. Hypercalcemia. Query abnormal parathyroid gland. Initial encounter.  EXAM: CT NECK WITH CONTRAST  TECHNIQUE: Multidetector CT imaging of the neck was performed using the standard protocol following the bolus administration of intravenous contrast.  CONTRAST:  80mL OMNIPAQUE IOHEXOL 300 MG/ML  SOLN  COMPARISON:  Nuclear medicine sestamibi parathyroid scan 04/23/2011  FINDINGS: Pharynx and larynx: Negative larynx. Pharyngeal soft tissue contours are within normal limits. Parapharyngeal spaces and retropharyngeal space are within normal limits.  Salivary glands: Submandibular and parotid glands are within normal limits. Negative sublingual space.  Thyroid: Multiple subcentimeter hypodense thyroid nodules, the largest are 5 mm and do not meet size criteria for ultrasound follow-up.  Nearly isodense to the thyroid parenchyma but appearing somewhat distinct from the superior left pole and extending farther cephalad than expected for thyroid parenchyma there is a triangular 6 x 13 by 24 mm soft tissue focus along the lateral aspect of the left retropharyngeal space, a immediately medial to the left common carotid artery. See coronal image 53 and series 2, image 59. This is suspicious for parathyroid tissue.  Lymph nodes: No lymphadenopathy. Cervical lymph nodes are within normal limits.  Vascular: Major vascular structures in the neck and at the  skullbase are patent. Mild soft and calcified atherosclerotic plaque at the carotid bifurcations. Calcified Coronary artery atherosclerosis is partially visible (series 2, image 129.  Limited intracranial: Negative.  Visualized orbits: Negative.  Mastoids and visualized paranasal sinuses: Negative.  Skeleton: Carious dentition. Elongated left greater than right stylohyoid ligament calcification. Widespread cervical disc degeneration. Calcified ligamentous hypertrophy about the odontoid. No acute osseous abnormality identified.  Upper chest: Dependent pulmonary atelectasis. Right upper lobe bronchiectasis. No superior mediastinal lymphadenopathy. No superior mediastinal nodules suspicious for parathyroid tissue.  IMPRESSION: 1. Triangular enhancing soft tissue superior to the left thyroid lobe suspicious for parathyroid adenoma in this setting. Recommend repeat Nuclear Medicine Sestamibi scan with SPECT and attention to this area. 2. Otherwise negative neck and superior mediastinal soft tissues. 3. Calcified Coronary artery atherosclerosis.  Carious dentition.   Electronically Signed   By: Odessa Fleming M.D.   On: 03/09/2015 18:50    Scheduled Meds: . atenolol  50 mg Oral Daily  . atorvastatin  20 mg Oral Daily  . cinacalcet  60 mg Oral BID WC  . dorzolamide  1 drop Both Eyes TID  . enoxaparin (LOVENOX) injection  40 mg Subcutaneous Q24H  . furosemide  20 mg Intravenous Daily  . latanoprost  1 drop Both Eyes QHS  .  magnesium oxide  400 mg Oral Daily  . pantoprazole  40 mg Oral Q1200  . potassium chloride SA  40 mEq Oral TID  . sodium chloride  3 mL Intravenous Q12H   Continuous Infusions: . sodium chloride 100 mL/hr at 03/12/15 0540    Principal Problem:   Hyperparathyroidism, primary Active Problems:   Schizophrenia   Essential hypertension   Hypercalcemia   Weak   Time spent: 35 minutes   Vassie Loll  [facility] Pager [phone]. If 7PM-7AM, please contact night-coverage at  [email or web address], password [email or web address] 03/12/2015, 11:59 AM  LOS: 4 days

## 2015-03-12 NOTE — Progress Notes (Addendum)
Chaplain: Responded to Consult  Explored anxiety: as it related to the origins and effects of [name] emotions.  She was wondering about her faith and what caused this to happen in her life.  We explored hope understanding that God can work within the surgery and that things happen.  We also explored what was trigging the anxiety. She takes care of her sick husband and would like to go home before her surgery Thursday.   [name] is in a lot better place after our visit and  responding to her needs with the help of her nurse.   03/12/15 1100  Clinical Encounter Type  Visited With Patient;Health care provider  Visit Type Initial;Spiritual support  Referral From Chaplain  Consult/Referral To Chaplain;Nurse  Spiritual Encounters  Spiritual Needs Emotional    Will follow up as needed.

## 2015-03-12 NOTE — Plan of Care (Signed)
RN discussed with Dr. About possibility of patient DC to return for surgery as outpatient on 6/23.  Dr. Concerned about labs and multiple prior admits for same complications.  Patient needs to remain in hospital till 6/23 to maintain electrolytes as needed for procedure.  Patient informed.

## 2015-03-12 NOTE — Care Management Note (Signed)
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  NCM will cont to follow , MD states patient k and ca keeps decreasing and every time we try to do surgery it is low and then we can not do the surgery, so would like to monitor electrolytes here in the hospital before surgery on 6/23.                Action/Plan:   Expected Discharge Date:                  Expected Discharge Plan:  Home/Self Care  In-House Referral:     Discharge planning Services  CM Consult  Post Acute Care Choice:    Choice offered to:     DME Arranged:    DME Agency:     HH Arranged:    HH Agency:     Status of Service:  In process, will continue to follow  Medicare Important Message Given:  Yes Date Medicare IM Given:  03/12/15 Medicare IM give by:  Letha Cape RN Date Additional Medicare IM Given:    Additional Medicare Important Message give by:     If discussed at Long Length of Stay Meetings, dates discussed:    Additional Comments:  Leone Haven, RN 03/12/2015, 12:45 PM

## 2015-03-13 LAB — BASIC METABOLIC PANEL
Anion gap: 3 — ABNORMAL LOW (ref 5–15)
CHLORIDE: 116 mmol/L — AB (ref 101–111)
CO2: 22 mmol/L (ref 22–32)
Calcium: 10.3 mg/dL (ref 8.9–10.3)
Creatinine, Ser: 0.71 mg/dL (ref 0.44–1.00)
GFR calc Af Amer: >60 mL/min (ref >60)
GLUCOSE: 99 mg/dL (ref 65–99)
Potassium: 4.3 mmol/L (ref 3.5–5.1)
Sodium: 141 mmol/L (ref 135–145)

## 2015-03-13 LAB — PTH, INTACT AND CALCIUM
Calcium, Total (PTH): 10.2 mg/dL (ref 8.7–10.3)
PTH: 213 pg/mL — ABNORMAL HIGH (ref 15–65)

## 2015-03-13 LAB — MAGNESIUM: Magnesium: 1.1 mg/dL — ABNORMAL LOW (ref 1.7–2.4)

## 2015-03-13 MED ORDER — ACETAMINOPHEN 325 MG PO TABS
650.0000 mg | ORAL_TABLET | ORAL | Status: DC | PRN
  Administered 2015-03-13: 650 mg via ORAL
  Filled 2015-03-13: qty 2

## 2015-03-13 MED ORDER — MAGNESIUM SULFATE 2 GM/50ML IV SOLN
2.0000 g | Freq: Once | INTRAVENOUS | Status: AC
  Administered 2015-03-14: 2 g via INTRAVENOUS
  Filled 2015-03-13: qty 50

## 2015-03-13 NOTE — Progress Notes (Signed)
Utilization review completed.  

## 2015-03-13 NOTE — Progress Notes (Signed)
[facility] PROGRESS NOTE  [name] RXY:[id number] DOB: 06-Oct-1950 DOA: 03/08/2015 PCP: [name], NP  Interim summary 64 y/o female with pmh significant for primary hyperparathyroidism, HTN, schizophrenia, HLD and multiple admissions in the last month (3) due to hypercalcemia. Patient has failed medical management and was send for admission with intentions of surgery for hyperparathyroidectomy. On admission CA was 14.7, patient acute renal failure due to dehydration and was also experiencing low Mg, low potassium and severe fatigue/unable to her ADL's w/o assistance. Gerking from [facility] consulted and plan is for surgery on 6/23. Electrolytes and renal function stable currently  Assessment/Plan: 1-Hypercalcemia: patient presented with polyuria, dehydration, some confusion and extreme fatigue -found to have calcium 14.7 on admisison -IV resuscitation initiated and will follow with lasix -continue sensipar (now 60mg  BID) -will follow Mg, K and phosphorus and replete them as needed  -follow BMET in am -current calcium level 10.2 -PTH 212  2-Hyperparathyroidism, primary: patient has had now 3 admissions in the last month secondary to hypercalcemia. Per PCP family has been helping with medication and she has actively follow with her  Endocrinologist. -Calcium on admission 14.7 -current calcium level 10.0 -Per Dr. Willeen Cass rec's [facility] has been contacted and Dr. Gerrit Friends has recommended CT neck with contrast; after discussing with him plan is for surgery on 6/23.  -PTH level 212 -surgery planned for 6/23 -will also continue sensipar (60mg  BID)  3-Schizophrenia: currently oriented X 3 and denies any hallucinations  -will continue mellaril  4-glaucoma: will continue xalatan and trusopt  5-GERD (gastroesophageal reflux disease): continue protonix  6-Essential hypertension: will continue home medication regimen, except lisinopril given AKI -patient started on heart healthy  diet -Blood pressure is soft at this moment will follow closely and make adjustment to her antihypertensive regimen as needed.  7-AKI: due to continue use of lisinopril and dehydration from hypercalcemia -Will continue holding ACE inhibitor  -Continue IV fluids -renal function back to normal -will monitor   8-hypomagnasemia and hypokalemia: will replete as needed and follow electrolytes -K 4.3 on 6/21 -Mg still low  Code Status: Full Family Communication: Brother ([name] [phone]) Disposition Plan: To be determined; remain as inpatient for now. Patient is still with complaints of general fatigue and signs of dehydration/renal insufficiency  Consultants:  CCS  Procedures:  CT neck with contrast for further evaluation parathyroid glands: 1. Triangular enhancing soft tissue superior to the left thyroid lobe suspicious for parathyroid adenoma in this setting.  Antibiotics:  None  HPI/Subjective: Patient feeling a lot better, no CP, no SOB, planned surgery for Thursday afternoon (03/15/15)  Objective: Filed Vitals:   03/13/15 2128  BP: 121/60  Pulse: 71  Temp: 98 F (36.7 C)  Resp: 20    Intake/Output Summary (Last 24 hours) at 03/13/15 2301 Last data filed at 03/13/15 1448  Gross per 24 hour  Intake    480 ml  Output   3200 ml  Net  -2720 ml   Filed Weights   03/11/15 0558 03/12/15 0539 03/13/15 0500  Weight: 66.86 kg (147 lb 6.4 oz) 65.454 kg (144 lb 4.8 oz) 67.042 kg (147 lb 12.8 oz)    Exam:   General:  Afebrile, AAOX3 and in no distress. Calcium is stable at 10.0 range; potassium is now WNL, but Mg is low. Patient still with high urine output.  Cardiovascular: S1 and S2, no rubs, no gallops  Respiratory: Clear to auscultation bilaterally  Abdomen: Soft, nontender, nondistended, positive bowel sounds  Musculoskeletal: No edema, no  cyanosis; no clubbing  Data Reviewed: Basic Metabolic Panel:  Recent Labs Lab 03/08/15 2046 03/09/15 0419  03/10/15 0438 03/11/15 0300 03/12/15 0632 03/13/15 0610  NA 139 142 139 141 140 141  K 4.2 4.2 3.8 3.3* 2.9* 4.3  CL 108 113* 115* 115* 111 116*  CO2 24 23 20* 19* 22 22  GLUCOSE 103* 82 90 106* 99 99  BUN <5* <5* <5*  CREATININE 1.27* 1.20* 0.87 0.76 0.86 0.71  CALCIUM 14.4* 12.7* 10.9* 10.7* 10.0  10.2 10.3  MG 0.8* 1.4* 1.0* 1.2*  --  1.1*  PHOS 2.7  --   --  2.2*  --   --    Liver Function Tests:  Recent Labs Lab 03/08/15 2046  AST 24  ALT 21  ALKPHOS 48  BILITOT 0.6  PROT 6.7  ALBUMIN 4.0   CBC:  Recent Labs Lab 03/08/15 2046 03/11/15 0300  WBC 6.9 6.2  NEUTROABS 4.0  --   HGB 11.9* 10.3*  HCT 37.0 31.6*  MCV 92.3 91.1  PLT 172 157    Studies: X-ray Chest Pa And Lateral  03/08/2015   CLINICAL DATA:  Weakness, fatigue for 1 day  EXAM: CHEST  2 VIEW  COMPARISON:  None.  FINDINGS: The heart size and mediastinal contours are within normal limits. Both lungs are clear. The visualized skeletal structures are unremarkable.  IMPRESSION: No active cardiopulmonary disease.   Electronically Signed   By: Elige Ko   On: 03/08/2015 20:01   Ct Soft Tissue Neck W Contrast  03/09/2015   CLINICAL DATA:  64 year old female with hyperparathyroidism. Hypercalcemia. Query abnormal parathyroid gland. Initial encounter.  EXAM: CT NECK WITH CONTRAST  TECHNIQUE: Multidetector CT imaging of the neck was performed using the standard protocol following the bolus administration of intravenous contrast.  CONTRAST:  80mL OMNIPAQUE IOHEXOL 300 MG/ML  SOLN  COMPARISON:  Nuclear medicine sestamibi parathyroid scan 04/23/2011  FINDINGS: Pharynx and larynx: Negative larynx. Pharyngeal soft tissue contours are within normal limits. Parapharyngeal spaces and retropharyngeal space are within normal limits.  Salivary glands: Submandibular and parotid glands are within normal limits. Negative sublingual space.  Thyroid: Multiple subcentimeter hypodense thyroid nodules, the largest are 5 mm and  do not meet size criteria for ultrasound follow-up.  Nearly isodense to the thyroid parenchyma but appearing somewhat distinct from the superior left pole and extending farther cephalad than expected for thyroid parenchyma there is a triangular 6 x 13 by 24 mm soft tissue focus along the lateral aspect of the left retropharyngeal space, a immediately medial to the left common carotid artery. See coronal image 53 and series 2, image 59. This is suspicious for parathyroid tissue.  Lymph nodes: No lymphadenopathy. Cervical lymph nodes are within normal limits.  Vascular: Major vascular structures in the neck and at the skullbase are patent. Mild soft and calcified atherosclerotic plaque at the carotid bifurcations. Calcified Coronary artery atherosclerosis is partially visible (series 2, image 129.  Limited intracranial: Negative.  Visualized orbits: Negative.  Mastoids and visualized paranasal sinuses: Negative.  Skeleton: Carious dentition. Elongated left greater than right stylohyoid ligament calcification. Widespread cervical disc degeneration. Calcified ligamentous hypertrophy about the odontoid. No acute osseous abnormality identified.  Upper chest: Dependent pulmonary atelectasis. Right upper lobe bronchiectasis. No superior mediastinal lymphadenopathy. No superior mediastinal nodules suspicious for parathyroid tissue.  IMPRESSION: 1. Triangular enhancing soft tissue superior to the left thyroid lobe suspicious for parathyroid adenoma in this setting. Recommend repeat Nuclear Medicine Sestamibi scan with SPECT  and attention to this area. 2. Otherwise negative neck and superior mediastinal soft tissues. 3. Calcified Coronary artery atherosclerosis.  Carious dentition.   Electronically Signed   By: Odessa Fleming M.D.   On: 03/09/2015 18:50    Scheduled Meds: . atenolol  50 mg Oral Daily  . atorvastatin  20 mg Oral Daily  . cinacalcet  60 mg Oral BID WC  . dorzolamide  1 drop Both Eyes TID  . enoxaparin (LOVENOX)  injection  40 mg Subcutaneous Q24H  . furosemide  20 mg Intravenous Daily  . latanoprost  1 drop Both Eyes QHS  . magnesium oxide  400 mg Oral Daily  . magnesium sulfate 1 - 4 g bolus IVPB  2 g Intravenous Once  . pantoprazole  40 mg Oral Q1200  . potassium chloride SA  40 mEq Oral TID  . sodium chloride  3 mL Intravenous Q12H   Continuous Infusions: . sodium chloride 100 mL/hr at 03/13/15 2154    Principal Problem:   Hyperparathyroidism, primary Active Problems:   Schizophrenia   Essential hypertension   Hypercalcemia   Weak   Time spent: 35 minutes   Vassie Loll  [facility] Pager [phone]. If 7PM-7AM, please contact night-coverage at [email or web address], password [facility] 03/13/2015, 11:01 PM  LOS: 5 days

## 2015-03-13 NOTE — Progress Notes (Signed)
  Subjective: No complaints  Objective: Vital signs in last 24 hours: Temp:  [97.8 F (36.6 C)-98.7 F (37.1 C)] 98.4 F (36.9 C) (06/21 0542) Pulse Rate:  [67-78] 67 (06/21 0542) Resp:  [18-20] 18 (06/21 0542) BP: (120-125)/(59-68) 123/66 mmHg (06/21 0542) SpO2:  [100 %] 100 % (06/21 0542) Weight:  [67.042 kg (147 lb 12.8 oz)] 67.042 kg (147 lb 12.8 oz) (06/21 0500) Last BM Date: 03/11/15  Intake/Output from previous day: 06/20 0701 - 06/21 0700 In: 1220 [P.O.:820; I.V.:400] Out: 3500 [Urine:3500] Intake/Output this shift:    Resp: clear to auscultation bilaterally Cardio: regular rate and rhythm GI: soft, non-tender; bowel sounds normal; no masses,  no organomegaly  Lab Results:   Recent Labs  03/11/15 0300  WBC 6.2  HGB 10.3*  HCT 31.6*  PLT 157   BMET  Recent Labs  03/12/15 0632 03/13/15 0610  NA 140 141  K 2.9* 4.3  CL 111 116*  CO2 22 22  GLUCOSE 99 99  BUN <5* <5*  CREATININE 0.86 0.71  CALCIUM 10.0  10.2 10.3   PT/INR No results for input(s): LABPROT, INR in the last 72 hours. ABG No results for input(s): PHART, HCO3 in the last 72 hours.  Invalid input(s): PCO2, PO2  Studies/Results: No results found.  Anti-infectives: Anti-infectives    None      Assessment/Plan: s/p * No surgery found * I spoke with Dr. Gerrit Friends and he favors keeping her here until surgery on thursday because of the risk of her Ca rising to high levels once the IVF is stopped.  Plan for surgery thursday  LOS: 5 days    [name],Heraclio Seidman S 03/13/2015

## 2015-03-13 NOTE — Progress Notes (Signed)
Chaplain follow-up  Focus on present: Recognized catastrophic thinking and anxiety about possible future ( Surgery/Thursday).    Will follow up    03/13/15 1200  Clinical Encounter Type  Visited With Patient;Health care provider  Visit Type Follow-up;Spiritual support  Referral From Nurse  Consult/Referral To Chaplain;Nurse  Spiritual Encounters  Spiritual Needs Emotional

## 2015-03-14 ENCOUNTER — Other Ambulatory Visit: Payer: Medicare Other

## 2015-03-14 LAB — BASIC METABOLIC PANEL
Anion gap: 6 (ref 5–15)
BUN: 5 mg/dL — AB (ref 6–20)
CHLORIDE: 112 mmol/L — AB (ref 101–111)
CO2: 23 mmol/L (ref 22–32)
Calcium: 10.6 mg/dL — ABNORMAL HIGH (ref 8.9–10.3)
Creatinine, Ser: 0.9 mg/dL (ref 0.44–1.00)
GFR calc non Af Amer: >60 mL/min (ref >60)
GLUCOSE: 80 mg/dL (ref 65–99)
Potassium: 5 mmol/L (ref 3.5–5.1)
Sodium: 141 mmol/L (ref 135–145)

## 2015-03-14 LAB — SURGICAL PCR SCREEN
MRSA, PCR: NEGATIVE
STAPHYLOCOCCUS AUREUS: NEGATIVE

## 2015-03-14 LAB — MAGNESIUM: Magnesium: 1.5 mg/dL — ABNORMAL LOW (ref 1.7–2.4)

## 2015-03-14 MED ORDER — MAGNESIUM SULFATE 2 GM/50ML IV SOLN
2.0000 g | Freq: Once | INTRAVENOUS | Status: AC
  Administered 2015-03-14: 2 g via INTRAVENOUS
  Filled 2015-03-14: qty 50

## 2015-03-14 MED ORDER — CEFAZOLIN SODIUM-DEXTROSE 2-3 GM-% IV SOLR: 2.0000 g | INTRAVENOUS | Status: DC

## 2015-03-14 MED ORDER — CEFAZOLIN SODIUM-DEXTROSE 2-3 GM-% IV SOLR
2.0000 g | INTRAVENOUS | Status: AC
  Administered 2015-03-15: 2 g via INTRAVENOUS
  Filled 2015-03-14: qty 50

## 2015-03-14 MED ORDER — CEFAZOLIN SODIUM-DEXTROSE 2-3 GM-% IV SOLR
2.0000 g | INTRAVENOUS | Status: DC
  Filled 2015-03-14: qty 50

## 2015-03-14 NOTE — Progress Notes (Signed)
[facility] PROGRESS NOTE  [name] ZOX:[id number] DOB: 07/02/1951 DOA: 03/08/2015 PCP: [name], NP  Interim summary 64 y/o female with pmh significant for primary hyperparathyroidism, HTN, schizophrenia, HLD and multiple admissions in the last month (3) due to hypercalcemia. Patient has failed medical management and was send for admission with intentions of surgery for hyperparathyroidectomy. On admission CA was 14.7, patient acute renal failure due to dehydration and was also experiencing low Mg, low potassium and severe fatigue/unable to her ADL's w/o assistance. Gerking from [facility] consulted and plan is for surgery on 6/23. Electrolytes and renal function stable currently  Assessment/Plan: 1-Hypercalcemia: patient presented with polyuria, dehydration, some confusion and extreme fatigue -found to have calcium 14.7 on admisison -IV resuscitation initiated and will follow with lasix -continue sensipar (now  BID) -will follow Mg, K and phosphorus and replete them as needed  -follow BMET in am -current calcium level 10.6 -PTH 212  2-Hyperparathyroidism, primary: patient has had now 3 admissions in the last month secondary to hypercalcemia. Per PCP family has been helping with medication and she has actively follow with her  Endocrinologist. -Calcium on admission 14.7 -current calcium level 10.6 -Per Dr. Willeen Cass rec's [facility] has been contacted and Dr. Gerrit Friends has recommended CT neck with contrast; after discussing with him plan is for surgery on 6/23.  -PTH level 212 -surgery planned for 6/23 -will also continue sensipar (  BID)  3-Schizophrenia: currently oriented X 3 and denies any hallucinations  -will continue mellaril  4-glaucoma: will continue xalatan and trusopt  5-GERD (gastroesophageal reflux disease): continue protonix  6-Essential hypertension: will continue home medication regimen, except lisinopril given AKI -patient started on heart healthy  diet -Blood pressure is soft at this moment will follow closely and make adjustment to her antihypertensive regimen as needed.  7-AKI: due to continue use of lisinopril and dehydration from hypercalcemia -Will continue holding ACE inhibitor  -Continue IV fluids -renal function back to normal -will monitor   8-hypomagnasemia and hypokalemia: will replete as needed and follow electrolytes -K 5.0 on 6/22 -Mg still slightly low at 1.5  Code Status: Full Family Communication: Brother ([name] [phone]) Disposition Plan: To be determined; remain as inpatient for now. Patient is still with complaints of general fatigue and signs of dehydration/renal insufficiency  Consultants:  [facility]  Procedures:  CT neck with contrast for further evaluation parathyroid glands: 1. Triangular enhancing soft tissue superior to the left thyroid lobe suspicious for parathyroid adenoma in this setting.  Antibiotics:  None  HPI/Subjective: Patient feeling ok, w/o acute distress and ok for surgery. Planned surgery for Thursday afternoon (03/15/15)  Objective: Filed Vitals:   03/14/15 0541  BP: 112/55  Pulse: 63  Temp: 97.9 F (36.6 C)  Resp: 18    Intake/Output Summary (Last 24 hours) at 03/14/15 1950 Last data filed at 03/14/15 1824  Gross per 24 hour  Intake 1718.33 ml  Output    300 ml  Net 1418.33 ml   Filed Weights   03/12/15 0539 03/13/15 0500 03/14/15 0541  Weight: 65.454 kg (144 lb 4.8 oz) 67.042 kg (147 lb 12.8 oz) 70.3 kg (154 lb 15.7 oz)    Exam:   General:  Afebrile, AAOX3 and in no distress. Calcium is stable at 10.6 range; potassium is now WNL, but Mg is borderline low. Patient denies any complaints and wants to proceed with surgery   Cardiovascular: S1 and S2, no rubs, no gallops  Respiratory: Clear to auscultation bilaterally  Abdomen: Soft, nontender, nondistended, positive  bowel sounds  Musculoskeletal: No edema, no cyanosis; no clubbing  Data  Reviewed: Basic Metabolic Panel:  Recent Labs Lab 03/08/15 2046 03/09/15 0419 03/10/15 0438 03/11/15 0300 03/12/15 0632 03/13/15 0610 03/14/15 0515  NA 139 142 139 141 140 141 141  K 4.2 4.2 3.8 3.3* 2.9* 4.3 5.0  CL 108 113* 115* 115* 111 116* 112*  CO2 24 23 20* 19* GLUCOSE 103* 82 90 106* 99 99 80  BUN <5* <5* <5* 5*  CREATININE 1.27* 1.20* 0.87 0.76 0.86 0.71 0.90  CALCIUM 14.4* 12.7* 10.9* 10.7* 10.0  10.2 10.3 10.6*  MG 0.8* 1.4* 1.0* 1.2*  --  1.1* 1.5*  PHOS 2.7  --   --  2.2*  --   --   --    Liver Function Tests:  Recent Labs Lab 03/08/15 2046  AST 24  ALT 21  ALKPHOS 48  BILITOT 0.6  PROT 6.7  ALBUMIN 4.0   CBC:  Recent Labs Lab 03/08/15 2046 03/11/15 0300  WBC 6.9 6.2  NEUTROABS 4.0  --   HGB 11.9* 10.3*  HCT 37.0 31.6*  MCV 92.3 91.1  PLT 172 157    Studies: X-ray Chest Pa And Lateral  03/08/2015   CLINICAL DATA:  Weakness, fatigue for 1 day  EXAM: CHEST  2 VIEW  COMPARISON:  None.  FINDINGS: The heart size and mediastinal contours are within normal limits. Both lungs are clear. The visualized skeletal structures are unremarkable.  IMPRESSION: No active cardiopulmonary disease.   Electronically Signed   By: Elige Ko   On: 03/08/2015 20:01   Ct Soft Tissue Neck W Contrast  03/09/2015   CLINICAL DATA:  64 year old female with hyperparathyroidism. Hypercalcemia. Query abnormal parathyroid gland. Initial encounter.  EXAM: CT NECK WITH CONTRAST  TECHNIQUE: Multidetector CT imaging of the neck was performed using the standard protocol following the bolus administration of intravenous contrast.  CONTRAST:  80mL OMNIPAQUE IOHEXOL 300 MG/ML  SOLN  COMPARISON:  Nuclear medicine sestamibi parathyroid scan 04/23/2011  FINDINGS: Pharynx and larynx: Negative larynx. Pharyngeal soft tissue contours are within normal limits. Parapharyngeal spaces and retropharyngeal space are within normal limits.  Salivary glands: Submandibular and parotid  glands are within normal limits. Negative sublingual space.  Thyroid: Multiple subcentimeter hypodense thyroid nodules, the largest are 5 mm and do not meet size criteria for ultrasound follow-up.  Nearly isodense to the thyroid parenchyma but appearing somewhat distinct from the superior left pole and extending farther cephalad than expected for thyroid parenchyma there is a triangular 6 x 13 by 24 mm soft tissue focus along the lateral aspect of the left retropharyngeal space, a immediately medial to the left common carotid artery. See coronal image 53 and series 2, image 59. This is suspicious for parathyroid tissue.  Lymph nodes: No lymphadenopathy. Cervical lymph nodes are within normal limits.  Vascular: Major vascular structures in the neck and at the skullbase are patent. Mild soft and calcified atherosclerotic plaque at the carotid bifurcations. Calcified Coronary artery atherosclerosis is partially visible (series 2, image 129.  Limited intracranial: Negative.  Visualized orbits: Negative.  Mastoids and visualized paranasal sinuses: Negative.  Skeleton: Carious dentition. Elongated left greater than right stylohyoid ligament calcification. Widespread cervical disc degeneration. Calcified ligamentous hypertrophy about the odontoid. No acute osseous abnormality identified.  Upper chest: Dependent pulmonary atelectasis. Right upper lobe bronchiectasis. No superior mediastinal lymphadenopathy. No superior mediastinal nodules suspicious for parathyroid tissue.  IMPRESSION: 1. Triangular enhancing soft tissue  superior to the left thyroid lobe suspicious for parathyroid adenoma in this setting. Recommend repeat Nuclear Medicine Sestamibi scan with SPECT and attention to this area. 2. Otherwise negative neck and superior mediastinal soft tissues. 3. Calcified Coronary artery atherosclerosis.  Carious dentition.   Electronically Signed   By: Odessa Fleming M.D.   On: 03/09/2015 18:50    Scheduled Meds: . atenolol  50  mg Oral Daily  . atorvastatin  20 mg Oral Daily  . [START ON 03/15/2015]  ceFAZolin (ANCEF) IV  2 g Intravenous To SS-Surg  . dorzolamide  1 drop Both Eyes TID  . furosemide  20 mg Intravenous Daily  . latanoprost  1 drop Both Eyes QHS  . magnesium oxide  400 mg Oral Daily  . pantoprazole  40 mg Oral Q1200  . potassium chloride SA  40 mEq Oral TID  . sodium chloride  3 mL Intravenous Q12H   Continuous Infusions: . sodium chloride 100 mL/hr at 03/14/15 1753    Principal Problem:   Hyperparathyroidism, primary Active Problems:   Schizophrenia   Essential hypertension   Hypercalcemia   Weak   Time spent: 35 minutes   Vassie Loll  [facility] Pager [phone]. If 7PM-7AM, please contact night-coverage at [email or web address], password [facility] 03/14/2015, 7:50 PM  LOS: 6 days

## 2015-03-14 NOTE — Consult Note (Signed)
   [facility] CM Inpatient Consult   03/14/2015  ZARRIAH STARKEL May 21, 1951 [id number] Referral received to assess for care management services. Explained that [place] [facility] is a covered benefit of insurance.   Met with the patient regarding the benefits of [place] [facility] services. Review information for [facility] Care Management and a folder was provided with contact information.  Explained that [place] [facility] does not interfere with or replace any services arranged by the inpatient care management staff.  Patient declined services with [place] [facility], at this time.  Patient verbalizes that she has two family members that are CNAs and a nurse that keeps a check on her on a regular basis.  Patient denies having any problems with medications or any educations needs. This Probation officer also spoke with [facility] representative who made a visit  With the patient and patient will receive post hospital calls from them.  Patient states she didn't feel that she needed any home visit or extra calls at discharge.  A brochure and contact information was given to the patient as well. Will update RNCM on the patient's decision.   For questions, please contact: Natividad Brood, RN BSN [facility] Liaison  [phone] business mobile phone

## 2015-03-14 NOTE — Progress Notes (Signed)
Patient ID: [name], female   DOB: [birth year], 64 y.o.   MRN: [id number]  General Surgery [facility]  Subjective: Patient pleasant, comfortable in bed.  No complaint.  Discussed planned surgery for tomorrow.  Objective: Vital signs in last 24 hours: Temp:  [97.9 F (36.6 C)-98.7 F (37.1 C)] 97.9 F (36.6 C) (06/22 0541) Pulse Rate:  [63-74] 63 (06/22 0541) Resp:  [18-20] 18 (06/22 0541) BP: (108-121)/(55-60) 112/55 mmHg (06/22 0541) SpO2:  [100 %] 100 % (06/22 0541) Weight:  [70.3 kg (154 lb 15.7 oz)] 70.3 kg (154 lb 15.7 oz) (06/22 0541) Last BM Date: 03/13/15  Intake/Output from previous day: 06/21 0701 - 06/22 0700 In: 1718.3 [P.O.:600; I.V.:1118.3] Out: 1900 [Urine:1900] Intake/Output this shift:    Physical Exam: HEENT - sclerae clear, mucous membranes moist Neck - soft, no mass; voice normal Chest - clear bilaterally Cor - RRR Abdomen - soft without distension; no tenderness Ext - no edema, non-tender Neuro - alert & oriented, no focal deficits  Lab Results:  No results for input(s): WBC, HGB, HCT, PLT in the last 72 hours. BMET  Recent Labs  03/13/15 0610 03/14/15 0515  NA 141 141  K 4.3 5.0  CL 116* 112*  CO2 22 23  GLUCOSE 99 80  BUN <5* 5*  CREATININE 0.71 0.90  CALCIUM 10.3 10.6*   PT/INR No results for input(s): LABPROT, INR in the last 72 hours. Comprehensive Metabolic Panel:    Component Value Date/Time   NA 141 03/14/2015 0515   NA 141 03/13/2015 0610   NA 144 03/01/2015 0245   NA 136 02/22/2015 1027   K 5.0 03/14/2015 0515   K 4.3 03/13/2015 0610   CL 112* 03/14/2015 0515   CL 116* 03/13/2015 0610   CO2 23 03/14/2015 0515   CO2 22 03/13/2015 0610   BUN 5* 03/14/2015 0515   BUN <5* 03/13/2015 0610   BUN 5* 03/01/2015 0245   BUN 14 02/22/2015 1027   CREATININE 0.90 03/14/2015 0515   CREATININE 0.71 03/13/2015 0610   GLUCOSE 80 03/14/2015 0515   GLUCOSE 99 03/13/2015 0610   GLUCOSE 114* 03/01/2015  0245   GLUCOSE 132* 02/22/2015 1027   CALCIUM 10.6* 03/14/2015 0515   CALCIUM 10.3 03/13/2015 0610   CALCIUM 10.2 03/12/2015 0632   AST 24 03/08/2015 2046   AST 19 02/26/2015 0430   ALT 21 03/08/2015 2046   ALT 18 02/26/2015 0430   ALKPHOS 48 03/08/2015 2046   ALKPHOS 45 02/26/2015 0430   BILITOT 0.6 03/08/2015 2046   BILITOT 0.4 02/26/2015 0430   BILITOT 0.5 01/25/2015 1210   PROT 6.7 03/08/2015 2046   PROT 5.9* 02/26/2015 0430   PROT 6.5 01/25/2015 1210   PROT 6.6 07/18/2014 1253   ALBUMIN 4.0 03/08/2015 2046   ALBUMIN 3.1* 02/26/2015 0430    Studies/Results: No results found.  Anti-infectives: Anti-infectives    None      Assessment & Plans: Primary hyperparathyroidism  CT scan localizes a left superior parathyroid adenoma  Plan neck exploration and parathyroidectomy tomorrow afternoon (6/23)  Calcium 10.6 this AM, PTH 212  The risks and benefits of the procedure have been discussed at length with the patient.  The patient understands the proposed procedure, potential alternative treatments, and the course of recovery to be expected.  All of the patient's questions have been answered at this time.  The patient wishes to proceed with surgery.  Velora Heckler, MD, FACS General & Endocrine Surgery [facility] Office: [phone]  Ahriyah Vannest Judie Petit 03/14/2015

## 2015-03-14 NOTE — Patient Outreach (Signed)
Per EPIC, patient is currently in acute care, s/p surgery.   Plan: Attempt telephone contact on March 16, 2015 for community case management, assessment of needs.

## 2015-03-15 ENCOUNTER — Encounter (HOSPITAL_COMMUNITY)
Admission: AD | Disposition: A | Discharge status: Home / Self Care | Payer: Self-pay | Source: Ambulatory Visit | Attending: Internal Medicine

## 2015-03-15 ENCOUNTER — Encounter (HOSPITAL_COMMUNITY): Payer: Self-pay | Admitting: Anesthesiology

## 2015-03-15 ENCOUNTER — Inpatient Hospital Stay (HOSPITAL_COMMUNITY): Payer: Medicare Other | Admitting: Anesthesiology

## 2015-03-15 HISTORY — PX: PARATHYROIDECTOMY: SHX19

## 2015-03-15 LAB — CBC
HCT: 34.3 % — ABNORMAL LOW (ref 36.0–46.0)
Hemoglobin: 11.4 g/dL — ABNORMAL LOW (ref 12.0–15.0)
MCH: 30.2 pg (ref 26.0–34.0)
MCHC: 33.2 g/dL (ref 30.0–36.0)
MCV: 90.7 fL (ref 78.0–100.0)
PLATELETS: DECREASED 10*3/uL (ref 150–400)
RBC: 3.78 MIL/uL — AB (ref 3.87–5.11)
RDW: 12.5 % (ref 11.5–15.5)
WBC: 6.6 10*3/uL (ref 4.0–10.5)

## 2015-03-15 LAB — BASIC METABOLIC PANEL
Anion gap: 5 (ref 5–15)
BUN: 6 mg/dL (ref 6–20)
CHLORIDE: 113 mmol/L — AB (ref 101–111)
CO2: 22 mmol/L (ref 22–32)
Calcium: 12 mg/dL — ABNORMAL HIGH (ref 8.9–10.3)
Creatinine, Ser: 0.9 mg/dL (ref 0.44–1.00)
GFR calc non Af Amer: >60 mL/min (ref >60)
GLUCOSE: 94 mg/dL (ref 65–99)
Potassium: 5.6 mmol/L — ABNORMAL HIGH (ref 3.5–5.1)
Sodium: 140 mmol/L (ref 135–145)

## 2015-03-15 LAB — MAGNESIUM: Magnesium: 1.6 mg/dL — ABNORMAL LOW (ref 1.7–2.4)

## 2015-03-15 SURGERY — PARATHYROIDECTOMY
Anesthesia: General | Site: Neck

## 2015-03-15 MED ORDER — GLYCOPYRROLATE 0.2 MG/ML IJ SOLN
INTRAMUSCULAR | Status: DC | PRN
  Administered 2015-03-15: 0.4 mg via INTRAVENOUS

## 2015-03-15 MED ORDER — HYDROMORPHONE HCL 1 MG/ML IJ SOLN
0.2500 mg | INTRAMUSCULAR | Status: DC | PRN
  Administered 2015-03-15 (×2): 0.5 mg via INTRAVENOUS

## 2015-03-15 MED ORDER — HYDROCODONE-ACETAMINOPHEN 5-325 MG PO TABS
1.0000 | ORAL_TABLET | ORAL | Status: DC | PRN
  Administered 2015-03-15: 2 via ORAL
  Filled 2015-03-15: qty 2

## 2015-03-15 MED ORDER — PROMETHAZINE HCL 25 MG/ML IJ SOLN: 6.2500 mg | INTRAMUSCULAR | Status: DC | PRN

## 2015-03-15 MED ORDER — FUROSEMIDE 10 MG/ML IJ SOLN
40.0000 mg | Freq: Every day | INTRAMUSCULAR | Status: DC
  Administered 2015-03-15 – 2015-03-16 (×2): 40 mg via INTRAVENOUS
  Filled 2015-03-15 (×2): qty 4

## 2015-03-15 MED ORDER — 0.9 % SODIUM CHLORIDE (POUR BTL) OPTIME
TOPICAL | Status: DC | PRN
  Administered 2015-03-15: 1000 mL

## 2015-03-15 MED ORDER — ROCURONIUM BROMIDE 50 MG/5ML IV SOLN
INTRAVENOUS | Status: AC
  Filled 2015-03-15: qty 1

## 2015-03-15 MED ORDER — ONDANSETRON HCL 4 MG/2ML IJ SOLN
INTRAMUSCULAR | Status: AC
  Filled 2015-03-15: qty 2

## 2015-03-15 MED ORDER — PROPOFOL 10 MG/ML IV BOLUS
INTRAVENOUS | Status: AC
  Filled 2015-03-15: qty 20

## 2015-03-15 MED ORDER — MIDAZOLAM HCL 2 MG/2ML IJ SOLN: 0.5000 mg | Freq: Once | INTRAMUSCULAR | Status: DC | PRN

## 2015-03-15 MED ORDER — CHLORHEXIDINE GLUCONATE CLOTH 2 % EX PADS
6.0000 | MEDICATED_PAD | Freq: Once | CUTANEOUS | Status: AC
  Administered 2015-03-15: 6 via TOPICAL

## 2015-03-15 MED ORDER — PROPOFOL 10 MG/ML IV BOLUS
INTRAVENOUS | Status: DC | PRN
  Administered 2015-03-15: 100 mg via INTRAVENOUS

## 2015-03-15 MED ORDER — HEMOSTATIC AGENTS (NO CHARGE) OPTIME
TOPICAL | Status: DC | PRN
  Administered 2015-03-15: 1 via TOPICAL

## 2015-03-15 MED ORDER — MIDAZOLAM HCL 2 MG/2ML IJ SOLN
INTRAMUSCULAR | Status: AC
  Filled 2015-03-15: qty 2

## 2015-03-15 MED ORDER — LACTATED RINGERS IV SOLN
INTRAVENOUS | Status: DC
  Administered 2015-03-15 (×2): via INTRAVENOUS

## 2015-03-15 MED ORDER — LIDOCAINE HCL (CARDIAC) 20 MG/ML IV SOLN
INTRAVENOUS | Status: AC
  Filled 2015-03-15: qty 5

## 2015-03-15 MED ORDER — HYDROMORPHONE HCL 1 MG/ML IJ SOLN
INTRAMUSCULAR | Status: AC
  Filled 2015-03-15: qty 1

## 2015-03-15 MED ORDER — ONDANSETRON HCL 4 MG/2ML IJ SOLN: 4.0000 mg | Freq: Four times a day (QID) | INTRAMUSCULAR | Status: DC | PRN

## 2015-03-15 MED ORDER — ONDANSETRON HCL 4 MG PO TABS: 4.0000 mg | ORAL_TABLET | Freq: Four times a day (QID) | ORAL | Status: DC | PRN

## 2015-03-15 MED ORDER — FENTANYL CITRATE (PF) 100 MCG/2ML IJ SOLN
INTRAMUSCULAR | Status: DC | PRN
  Administered 2015-03-15 (×2): 50 ug via INTRAVENOUS

## 2015-03-15 MED ORDER — ACETAMINOPHEN 325 MG PO TABS: 650.0000 mg | ORAL_TABLET | ORAL | Status: DC | PRN

## 2015-03-15 MED ORDER — DEXAMETHASONE SODIUM PHOSPHATE 4 MG/ML IJ SOLN
INTRAMUSCULAR | Status: AC
  Filled 2015-03-15: qty 2

## 2015-03-15 MED ORDER — ROCURONIUM BROMIDE 100 MG/10ML IV SOLN
INTRAVENOUS | Status: DC | PRN
  Administered 2015-03-15: 10 mg via INTRAVENOUS
  Administered 2015-03-15: 40 mg via INTRAVENOUS

## 2015-03-15 MED ORDER — ONDANSETRON HCL 4 MG/2ML IJ SOLN
INTRAMUSCULAR | Status: DC | PRN
  Administered 2015-03-15: 4 mg via INTRAVENOUS

## 2015-03-15 MED ORDER — FENTANYL CITRATE (PF) 250 MCG/5ML IJ SOLN
INTRAMUSCULAR | Status: AC
  Filled 2015-03-15: qty 5

## 2015-03-15 MED ORDER — HYDROMORPHONE HCL 1 MG/ML IJ SOLN
1.0000 mg | INTRAMUSCULAR | Status: DC | PRN
  Administered 2015-03-15 – 2015-03-16 (×2): 1 mg via INTRAVENOUS
  Filled 2015-03-15 (×2): qty 1

## 2015-03-15 MED ORDER — LIDOCAINE HCL 4 % MT SOLN
OROMUCOSAL | Status: DC | PRN
  Administered 2015-03-15: 3 mL via TOPICAL

## 2015-03-15 MED ORDER — LIDOCAINE HCL (CARDIAC) 20 MG/ML IV SOLN
INTRAVENOUS | Status: DC | PRN
  Administered 2015-03-15: 30 mg via INTRAVENOUS

## 2015-03-15 MED ORDER — ONDANSETRON HCL 4 MG/2ML IJ SOLN
INTRAMUSCULAR | Status: AC
  Filled 2015-03-15: qty 8

## 2015-03-15 MED ORDER — MIDAZOLAM HCL 5 MG/5ML IJ SOLN
INTRAMUSCULAR | Status: DC | PRN
  Administered 2015-03-15 (×2): 1 mg via INTRAVENOUS

## 2015-03-15 MED ORDER — MEPERIDINE HCL 25 MG/ML IJ SOLN: 6.2500 mg | INTRAMUSCULAR | Status: DC | PRN

## 2015-03-15 MED ORDER — KCL IN DEXTROSE-NACL 20-5-0.45 MEQ/L-%-% IV SOLN
INTRAVENOUS | Status: DC
  Filled 2015-03-15: qty 1000

## 2015-03-15 MED ORDER — PHENYLEPHRINE HCL 10 MG/ML IJ SOLN
INTRAMUSCULAR | Status: DC | PRN
  Administered 2015-03-15: 120 ug via INTRAVENOUS
  Administered 2015-03-15: 30 ug via INTRAVENOUS

## 2015-03-15 MED ORDER — NEOSTIGMINE METHYLSULFATE 10 MG/10ML IV SOLN
INTRAVENOUS | Status: DC | PRN
  Administered 2015-03-15: 3 mg via INTRAVENOUS

## 2015-03-15 SURGICAL SUPPLY — 54 items
ATTRACTOMAT 16X20 MAGNETIC DRP (DRAPES) ×3 IMPLANT
BENZOIN TINCTURE PRP APPL 2/3 (GAUZE/BANDAGES/DRESSINGS) ×3 IMPLANT
BLADE SURG 10 STRL SS (BLADE) ×3 IMPLANT
BLADE SURG 15 STRL LF DISP TIS (BLADE) ×1 IMPLANT
BLADE SURG 15 STRL SS (BLADE) ×2
CANISTER SUCTION 2500CC (MISCELLANEOUS) ×3 IMPLANT
CHLORAPREP W/TINT 26ML (MISCELLANEOUS) ×3 IMPLANT
CLIP TI MEDIUM 6 (CLIP) ×3 IMPLANT
CLIP TI WIDE RED SMALL 6 (CLIP) ×6 IMPLANT
CLOSURE STERI-STRIP 1/2X4 (GAUZE/BANDAGES/DRESSINGS) ×1
CLOSURE WOUND 1/2 X4 (GAUZE/BANDAGES/DRESSINGS) ×1
CLSR STERI-STRIP ANTIMIC 1/2X4 (GAUZE/BANDAGES/DRESSINGS) ×2 IMPLANT
CONT SPEC 4OZ CLIKSEAL STRL BL (MISCELLANEOUS) ×3 IMPLANT
COVER SURGICAL LIGHT HANDLE (MISCELLANEOUS) ×3 IMPLANT
DRAPE PED LAPAROTOMY (DRAPES) ×3 IMPLANT
DRAPE UTILITY XL STRL (DRAPES) ×6 IMPLANT
ELECT CAUTERY BLADE 6.4 (BLADE) ×3 IMPLANT
ELECT REM PT RETURN 9FT ADLT (ELECTROSURGICAL) ×3
ELECTRODE REM PT RTRN 9FT ADLT (ELECTROSURGICAL) ×1 IMPLANT
GAUZE SPONGE 2X2 8PLY STRL LF (GAUZE/BANDAGES/DRESSINGS) ×1 IMPLANT
GAUZE SPONGE 4X4 16PLY XRAY LF (GAUZE/BANDAGES/DRESSINGS) ×3 IMPLANT
GLOVE BIO SURGEON STRL SZ7.5 (GLOVE) ×3 IMPLANT
GLOVE BIOGEL PI IND STRL 7.5 (GLOVE) ×1 IMPLANT
GLOVE BIOGEL PI INDICATOR 7.5 (GLOVE) ×2
GLOVE SURG ORTHO 8.0 STRL STRW (GLOVE) ×3 IMPLANT
GOWN STRL REUS W/ TWL LRG LVL3 (GOWN DISPOSABLE) ×2 IMPLANT
GOWN STRL REUS W/ TWL XL LVL3 (GOWN DISPOSABLE) ×1 IMPLANT
GOWN STRL REUS W/TWL LRG LVL3 (GOWN DISPOSABLE) ×4
GOWN STRL REUS W/TWL XL LVL3 (GOWN DISPOSABLE) ×2
HEMOSTAT SURGICEL 2X4 FIBR (HEMOSTASIS) ×3 IMPLANT
KIT BASIN OR (CUSTOM PROCEDURE TRAY) ×3 IMPLANT
KIT ROOM TURNOVER OR (KITS) ×3 IMPLANT
NEEDLE HYPO 25GX1X1/2 BEV (NEEDLE) ×3 IMPLANT
NS IRRIG 1000ML POUR BTL (IV SOLUTION) ×3 IMPLANT
PACK SURGICAL SETUP 50X90 (CUSTOM PROCEDURE TRAY) ×3 IMPLANT
PAD ARMBOARD 7.5X6 YLW CONV (MISCELLANEOUS) ×6 IMPLANT
PENCIL BUTTON HOLSTER BLD 10FT (ELECTRODE) ×3 IMPLANT
SPONGE GAUZE 2X2 STER 10/PKG (GAUZE/BANDAGES/DRESSINGS) ×2
SPONGE GAUZE 4X4 12PLY STER LF (GAUZE/BANDAGES/DRESSINGS) ×3 IMPLANT
SPONGE INTESTINAL PEANUT (DISPOSABLE) IMPLANT
STRIP CLOSURE SKIN 1/2X4 (GAUZE/BANDAGES/DRESSINGS) ×2 IMPLANT
SUT MNCRL AB 4-0 PS2 18 (SUTURE) ×3 IMPLANT
SUT SILK 2 0 (SUTURE)
SUT SILK 2-0 18XBRD TIE 12 (SUTURE) IMPLANT
SUT SILK 3 0 (SUTURE)
SUT SILK 3-0 18XBRD TIE 12 (SUTURE) IMPLANT
SUT VIC AB 3-0 SH 18 (SUTURE) ×3 IMPLANT
SYR BULB 3OZ (MISCELLANEOUS) ×3 IMPLANT
SYR CONTROL 10ML LL (SYRINGE) IMPLANT
TAPE CLOTH SOFT 2X10 (GAUZE/BANDAGES/DRESSINGS) ×3 IMPLANT
TOWEL OR 17X24 6PK STRL BLUE (TOWEL DISPOSABLE) ×3 IMPLANT
TOWEL OR 17X26 10 PK STRL BLUE (TOWEL DISPOSABLE) ×3 IMPLANT
TUBE CONNECTING 12'X1/4 (SUCTIONS) ×1
TUBE CONNECTING 12X1/4 (SUCTIONS) ×2 IMPLANT

## 2015-03-15 NOTE — Progress Notes (Addendum)
Pt. Arrived back to the unit with PACU nurse. Reported off at bedside. Dressing noted to front of throat; clean, dry, intact. Pt. Alert and oriented with no signs of distress noted. Placed pt. Back on telemetry. Pt. Due to void. Vitals appear stable. Call bell within reach. No further needs noted at this time. Will continue to monitor.

## 2015-03-15 NOTE — Anesthesia Preprocedure Evaluation (Addendum)
Anesthesia Evaluation  Patient identified by MRN, date of birth, ID band Patient awake    History of Anesthesia Complications Negative for: history of anesthetic complications  Airway Mallampati: II  TM Distance: >3 FB Neck ROM: Full    Dental  (+) Dental Advisory Given, Missing, Chipped, Loose, Poor Dentition   Pulmonary COPDCurrent Smoker,  breath sounds clear to auscultation        Cardiovascular hypertension, Pt. on medications and Pt. on home beta blockers - anginaRhythm:Regular Rate:Normal     Neuro/Psych Schizophrenia negative neurological ROS     GI/Hepatic negative GI ROS, Neg liver ROS,   Endo/Other  negative endocrine ROS  Renal/GU Renal InsufficiencyRenal disease (K+ 5.6)     Musculoskeletal  (+) Arthritis -,   Abdominal   Peds  Hematology  (+) Blood dyscrasia (Hb 11.4), ,   Anesthesia Other Findings   Reproductive/Obstetrics                            Anesthesia Physical Anesthesia Plan  ASA: III  Anesthesia Plan: General   Post-op Pain Management:    Induction: Intravenous  Airway Management Planned: Oral ETT  Additional Equipment:   Intra-op Plan:   Post-operative Plan: Extubation in OR  Informed Consent: I have reviewed the patients History and Physical, chart, labs and discussed the procedure including the risks, benefits and alternatives for the proposed anesthesia with the patient or authorized representative who has indicated his/her understanding and acceptance.   Dental advisory given  Plan Discussed with: Surgeon and CRNA  Anesthesia Plan Comments: (Plan routine monitors, GETA)        Anesthesia Quick Evaluation

## 2015-03-15 NOTE — Progress Notes (Signed)
Informed MD about morning potassium and calcium. Orders placed. Will continue to monitor.

## 2015-03-15 NOTE — Progress Notes (Signed)
Pt's BP 108/54 Pulse 68 manually. Natalia Leatherwood MD paged, notified, and aware. No order received.

## 2015-03-15 NOTE — Progress Notes (Signed)
[facility] PROGRESS NOTE  [name]:[id number] DOB: January 07, 1951 DOA: 03/08/2015 PCP: [name], NP  Interim summary 64 y/o female with pmh significant for primary hyperparathyroidism, HTN, schizophrenia, HLD and multiple admissions in the last month (3) due to hypercalcemia. Patient has failed medical management and was send for admission with intentions of surgery for hyperparathyroidectomy. On admission CA was 14.7, patient acute renal failure due to dehydration and was also experiencing low Mg, low potassium and severe fatigue/unable to her ADL's w/o assistance. Gerking from [facility] consulted and plan is for surgery later today 6/23. Electrolytes and renal function overall stable. Follow post surgery rec's  Assessment/Plan: 1-Hypercalcemia: patient presented with polyuria, dehydration, some confusion and extreme fatigue -found to have calcium 14.7 on admisison -IV resuscitation initiated and will follow with lasix -continue sensipar (now  BID) -will follow Mg, K and phosphorus and replete them as needed  -follow BMET in am -current calcium level 12.0 -PTH 212  2-Hyperparathyroidism, primary: patient has had now 3 admissions in the last month secondary to hypercalcemia. Per PCP family has been helping with medication and she has actively follow with her  Endocrinologist. -Calcium on admission 14.7 -current calcium level 12.0 -Per Dr. Willeen Cass rec's [facility] has been contacted and Dr. Gerrit Friends has recommended CT neck with contrast; after discussing with him plan is for surgery later today 6/23.  -PTH level 212 -surgery planned for 6/23 -will also continue sensipar (  BID)  3-Schizophrenia: currently oriented X 3 and denies any hallucinations  -will continue mellaril  4-glaucoma: will continue xalatan and trusopt  5-GERD (gastroesophageal reflux disease): continue protonix  6-Essential hypertension: will continue home medication regimen, except lisinopril given  AKI -patient started on heart healthy diet -Blood pressure is soft at this moment will follow closely and make adjustment to her antihypertensive regimen as needed.  7-AKI: due to continue use of lisinopril and dehydration from hypercalcemia -Will continue holding ACE inhibitor  -Continue IV fluids -renal function back to normal -will monitor   8-hypomagnasemia and hypokalemia: will replete as needed and follow electrolytes -K 5.6 on 6/23 -Mg stable at 1.6  Code Status: Full Family Communication: Brother ([name] [phone]) Disposition Plan: To be determined; remain as inpatient for now. Patient is still with complaints of general fatigue and signs of dehydration/renal insufficiency  Consultants:  [facility]  Procedures:  CT neck with contrast for further evaluation parathyroid glands: 1. Triangular enhancing soft tissue superior to the left thyroid lobe suspicious for parathyroid adenoma in this setting.  Antibiotics:  None  HPI/Subjective: Patient feeling ok, w/o acute distress. Surgery plan for today. Calcium up to 12.0  Objective: Filed Vitals:   03/15/15 1617  BP: 127/64  Pulse: 67  Temp:   Resp: 14    Intake/Output Summary (Last 24 hours) at 03/15/15 1748 Last data filed at 03/15/15 1600  Gross per 24 hour  Intake 1271.67 ml  Output   2700 ml  Net -1428.33 ml   Filed Weights   03/13/15 0500 03/14/15 0541 03/15/15 0537  Weight: 67.042 kg (147 lb 12.8 oz) 70.3 kg (154 lb 15.7 oz) 67.042 kg (147 lb 12.8 oz)    Exam:   General:  Afebrile, AAOX3 and in no distress. Calcium up to 12.6. Surgery plan for today  Cardiovascular: S1 and S2, no rubs, no gallops  Respiratory: Clear to auscultation bilaterally  Abdomen: Soft, nontender, nondistended, positive bowel sounds  Musculoskeletal: No edema, no cyanosis; no clubbing  Data Reviewed: Basic Metabolic Panel:  Recent Labs Lab  03/08/15 2046  03/10/15 0438 03/11/15 0300 03/12/15 0632 03/13/15 0610  03/14/15 0515 03/15/15 0555  NA 139  < > 139 141 140 141 141 140  K 4.2  < > 3.8 3.3* 2.9* 4.3 5.0 5.6*  CL 108  < > 115* 115* 111 116* 112* 113*  CO2 24  < > 20* 19* GLUCOSE 103*  < > 90 106* 99 99 80 94  BUN 9  < > 6 <5* <5* <5* 5* 6  CREATININE 1.27*  < > 0.87 0.76 0.86 0.71 0.90 0.90  CALCIUM 14.4*  < > 10.9* 10.7* 10.0  10.2 10.3 10.6* 12.0*  MG 0.8*  < > 1.0* 1.2*  --  1.1* 1.5* 1.6*  PHOS 2.7  --   --  2.2*  --   --   --   --   < > = values in this interval not displayed. Liver Function Tests:  Recent Labs Lab 03/08/15 2046  AST 24  ALT 21  ALKPHOS 48  BILITOT 0.6  PROT 6.7  ALBUMIN 4.0   CBC:  Recent Labs Lab 03/08/15 2046 03/11/15 0300 03/15/15 0555  WBC 6.9 6.2 6.6  NEUTROABS 4.0  --   --   HGB 11.9* 10.3* 11.4*  HCT 37.0 31.6* 34.3*  MCV 92.3 91.1 90.7  PLT 172 157 PLATELET CLUMPS NOTED ON SMEAR, COUNT APPEARS DECREASED    Studies: X-ray Chest Pa And Lateral  03/08/2015   CLINICAL DATA:  Weakness, fatigue for 1 day  EXAM: CHEST  2 VIEW  COMPARISON:  None.  FINDINGS: The heart size and mediastinal contours are within normal limits. Both lungs are clear. The visualized skeletal structures are unremarkable.  IMPRESSION: No active cardiopulmonary disease.   Electronically Signed   By: Elige Ko   On: 03/08/2015 20:01   Ct Soft Tissue Neck W Contrast  03/09/2015   CLINICAL DATA:  64 year old female with hyperparathyroidism. Hypercalcemia. Query abnormal parathyroid gland. Initial encounter.  EXAM: CT NECK WITH CONTRAST  TECHNIQUE: Multidetector CT imaging of the neck was performed using the standard protocol following the bolus administration of intravenous contrast.  CONTRAST:  80mL OMNIPAQUE IOHEXOL 300 MG/ML  SOLN  COMPARISON:  Nuclear medicine sestamibi parathyroid scan 04/23/2011  FINDINGS: Pharynx and larynx: Negative larynx. Pharyngeal soft tissue contours are within normal limits. Parapharyngeal spaces and retropharyngeal space are within  normal limits.  Salivary glands: Submandibular and parotid glands are within normal limits. Negative sublingual space.  Thyroid: Multiple subcentimeter hypodense thyroid nodules, the largest are 5 mm and do not meet size criteria for ultrasound follow-up.  Nearly isodense to the thyroid parenchyma but appearing somewhat distinct from the superior left pole and extending farther cephalad than expected for thyroid parenchyma there is a triangular 6 x 13 by 24 mm soft tissue focus along the lateral aspect of the left retropharyngeal space, a immediately medial to the left common carotid artery. See coronal image 53 and series 2, image 59. This is suspicious for parathyroid tissue.  Lymph nodes: No lymphadenopathy. Cervical lymph nodes are within normal limits.  Vascular: Major vascular structures in the neck and at the skullbase are patent. Mild soft and calcified atherosclerotic plaque at the carotid bifurcations. Calcified Coronary artery atherosclerosis is partially visible (series 2, image 129.  Limited intracranial: Negative.  Visualized orbits: Negative.  Mastoids and visualized paranasal sinuses: Negative.  Skeleton: Carious dentition. Elongated left greater than right stylohyoid ligament calcification. Widespread cervical disc degeneration. Calcified ligamentous hypertrophy about  the odontoid. No acute osseous abnormality identified.  Upper chest: Dependent pulmonary atelectasis. Right upper lobe bronchiectasis. No superior mediastinal lymphadenopathy. No superior mediastinal nodules suspicious for parathyroid tissue.  IMPRESSION: 1. Triangular enhancing soft tissue superior to the left thyroid lobe suspicious for parathyroid adenoma in this setting. Recommend repeat Nuclear Medicine Sestamibi scan with SPECT and attention to this area. 2. Otherwise negative neck and superior mediastinal soft tissues. 3. Calcified Coronary artery atherosclerosis.  Carious dentition.   Electronically Signed   By: Odessa Fleming M.D.    On: 03/09/2015 18:50    Scheduled Meds: . atenolol  50 mg Oral Daily  . atorvastatin  20 mg Oral Daily  . dorzolamide  1 drop Both Eyes TID  . furosemide  40 mg Intravenous Daily  . HYDROmorphone      . latanoprost  1 drop Both Eyes QHS  . magnesium oxide  400 mg Oral Daily  . pantoprazole  40 mg Oral Q1200  . sodium chloride  3 mL Intravenous Q12H   Continuous Infusions: . sodium chloride 100 mL/hr at 03/15/15 0433  . dextrose 5 % and 0.45 % NaCl with KCl 20 mEq/L    . lactated ringers 10 mL/hr at 03/15/15 1240    Principal Problem:   Hyperparathyroidism, primary Active Problems:   Schizophrenia   Primary hyperparathyroidism   Essential hypertension   Hypercalcemia   Weak   Time spent: 20 minutes   Vassie Loll  [facility] Pager [phone]. If 7PM-7AM, please contact night-coverage at [email or web address], password [facility] 03/15/2015, 5:48 PM  LOS: 7 days

## 2015-03-15 NOTE — Progress Notes (Signed)
Chaplain follow-up  PT: has surgery today.  Encouraged focus on present and explored her faith.  Chaplain: listen attentively and reflectively as pt share her faith and what she knew God would do for her during this surgery.   03/15/15 0900  Clinical Encounter Type  Visited With Patient;Health care provider  Visit Type Follow-up;Spiritual support  Referral From Nurse  Consult/Referral To Chaplain  Spiritual Encounters  Spiritual Needs Emotional

## 2015-03-15 NOTE — Anesthesia Postprocedure Evaluation (Signed)
  Anesthesia Post-op Note  Patient: [name]  Procedure(s) Performed: Procedure(s): PARATHYROIDECTOMY AND NECK EXPLORATION (N/A)  Patient Location: PACU  Anesthesia Type:General  Level of Consciousness: awake and alert   Airway and Oxygen Therapy: Patient Spontanous Breathing  Post-op Pain: mild  Post-op Assessment: Post-op Vital signs reviewed LLE Motor Response: Purposeful movement   RLE Motor Response: Purposeful movement        Post-op Vital Signs: stable  Last Vitals:  Filed Vitals:   03/15/15 1530  BP: 141/63  Pulse: 65  Temp:   Resp: 11    Complications: No apparent anesthesia complications

## 2015-03-15 NOTE — Op Note (Signed)
OPERATIVE REPORT - PARATHYROIDECTOMY  Preoperative diagnosis: Primary hyperparathyroidism  Postop diagnosis: Same  Procedure: Left superior minimally invasive parathyroidectomy  Surgeon:  Velora Heckler, MD, FACS  Assistant:  Magnus Ivan, RNFA  Anesthesia: Gen. endotracheal  Estimated blood loss: Minimal  Preparation: ChloraPrep  Indications: Patient is a 63yo BF with recurrent episodes of hypercalcemia.  Calcium levels have exceeded 14 on occasion.  Sestamibi scan negative.  4D CT positive for left superior adenoma.  Now for parathyroidectomy.  Procedure: Patient was prepared in the holding area. He was brought to operating room and placed in a supine position on the operating room table. Following administration of general anesthesia, the patient was positioned and then prepped and draped in the usual strict aseptic fashion. After ascertaining that an adequate level of anesthesia been achieved, a neck incision was made with a #15 blade. Dissection was carried through subcutaneous tissues and platysma. Hemostasis was obtained with the electrocautery. Skin flaps were developed circumferentially and a Weitlander retractor was placed for exposure.  Strap muscles were incised in the midline. Strap muscles were reflected exposing the thyroid lobe. With gentle blunt dissection the thyroid lobe was mobilized.  Dissection was carried through adipose tissue and an enlarged parathyroid gland was identified. It was gently mobilized. Vascular structures were divided between small and medium ligaclips. Care was taken to avoid the recurrent laryngeal nerve and the esophagus. The parathyroid gland was completely excised. It was submitted to pathology where frozen section confirmed parathyroid tissue consistent with adenoma.  Neck was irrigated with warm saline and good hemostasis was noted. Fibrillar was placed in the operative field. Strap muscles were reapproximated in the midline with interrupted 3-0  Vicryl sutures. Platysma was closed with interrupted 3-0 Vicryl sutures. Skin was closed with a running 4-0 Monocryl subcuticular suture. Marcaine was infiltrated circumferentially. Wound was washed and dried and benzoin and Steri-Strips were applied. Sterile gauze dressings were applied. Patient was awakened from anesthesia and brought to the recovery room. The patient tolerated the procedure well.   Velora Heckler, MD, FACS General & Endocrine Surgery [facility]

## 2015-03-16 ENCOUNTER — Other Ambulatory Visit: Payer: Self-pay | Admitting: Family Medicine

## 2015-03-16 ENCOUNTER — Encounter (HOSPITAL_COMMUNITY): Payer: Self-pay | Admitting: Surgery

## 2015-03-16 ENCOUNTER — Other Ambulatory Visit: Payer: Self-pay

## 2015-03-16 DIAGNOSIS — F2089 Other schizophrenia: Secondary | ICD-10-CM

## 2015-03-16 DIAGNOSIS — E210 Primary hyperparathyroidism: Principal | ICD-10-CM

## 2015-03-16 DIAGNOSIS — E875 Hyperkalemia: Secondary | ICD-10-CM

## 2015-03-16 LAB — BASIC METABOLIC PANEL
ANION GAP: 8 (ref 5–15)
Anion gap: 7 (ref 5–15)
BUN: 15 mg/dL (ref 6–20)
BUN: 16 mg/dL (ref 6–20)
CALCIUM: 10.5 mg/dL — AB (ref 8.9–10.3)
CALCIUM: 11.3 mg/dL — AB (ref 8.9–10.3)
CHLORIDE: 105 mmol/L (ref 101–111)
CO2: 20 mmol/L — ABNORMAL LOW (ref 22–32)
CO2: 23 mmol/L (ref 22–32)
CREATININE: 1.07 mg/dL — AB (ref 0.44–1.00)
CREATININE: 0.95 mg/dL (ref 0.44–1.00)
Chloride: 104 mmol/L (ref 101–111)
GFR calc Af Amer: >60 mL/min (ref >60)
GFR calc non Af Amer: 54 mL/min — ABNORMAL LOW (ref >60)
GLUCOSE: 125 mg/dL — AB (ref 65–99)
Glucose, Bld: 137 mg/dL — ABNORMAL HIGH (ref 65–99)
Potassium: 6.1 mmol/L (ref 3.5–5.1)
Potassium: 5.3 mmol/L — ABNORMAL HIGH (ref 3.5–5.1)
SODIUM: 134 mmol/L — AB (ref 135–145)
Sodium: 133 mmol/L — ABNORMAL LOW (ref 135–145)

## 2015-03-16 MED ORDER — SODIUM POLYSTYRENE SULFONATE 15 GM/60ML PO SUSP
15.0000 g | Freq: Once | ORAL | Status: AC
  Administered 2015-03-16: 15 g via ORAL
  Filled 2015-03-16: qty 60

## 2015-03-16 NOTE — Discharge Instructions (Signed)
[facility] ° °THYROID & PARATHYROID SURGERY:  POST-OP INSTRUCTIONS ° °Always review your discharge instruction sheet from the facility where your surgery was performed. ° °A prescription for pain medication may be given to you upon discharge.  Take your pain medication as prescribed.  If narcotic pain medicine is not needed, then you may take acetaminophen (Tylenol) or ibuprofen (Advil) as needed. ° °Take your usually prescribed medications unless otherwise directed. ° °If you need a refill on your pain medication, please contact your pharmacy. They will contact our office to request authorization.  Prescriptions will not be processed by our office after 5 pm or on weekends. ° °Start with a light diet upon arrival home, such as soup and crackers or toast.  Be sure to drink plenty of fluids daily.  Resume your normal diet the day after surgery. ° °Most patients will experience some swelling and bruising on the chest and neck area.  Ice packs will help.  Swelling and bruising can take several days to resolve.  ° °It is common to experience some constipation after surgery.  Increasing fluid intake and taking a stool softener will usually help or prevent this problem.  A mild laxative (Milk of Magnesia or Miralax) should be taken according to package directions if there has been no bowel movement after 48 hours. ° °You have steri-strips and a gauze dressing over your incision.  You may remove the gauze bandage on the second day after surgery, and you may shower at that time.  Leave your steri-strips (small skin tapes) in place directly over the incision.  These strips should remain on the skin for 5-7 days and then be removed.  You may get them wet in the shower and pat them dry. ° °You may resume regular (light) daily activities beginning the next day - such as daily self-care, walking, climbing stairs - gradually increasing activities as tolerated.  You may have sexual intercourse when it is  comfortable.  Refrain from any heavy lifting or straining until approved by your doctor.  You may drive when you no longer are taking prescription pain medication, you can comfortably wear a seatbelt, and you can safely maneuver your car and apply brakes. ° °You should see your doctor in the office for a follow-up appointment approximately two to three weeks after your surgery.  Make sure that you call for this appointment within a day or two after you arrive home to insure a convenient appointment time. ° °WHEN TO CALL YOUR DOCTOR: °-- Fever greater than 101.5 °-- Inability to urinate °-- Nausea and/or vomiting - persistent °-- Extreme swelling or bruising °-- Continued bleeding from incision °-- Increased pain, redness, or drainage from the incision °-- Difficulty swallowing or breathing °-- Muscle cramping or spasms °-- Numbness or tingling in hands or around lips ° °The clinic staff is available to answer your questions during regular business hours.  Please don’t hesitate to call and ask to speak to one of the nurses if you have concerns. ° °Todd M. Gerkin, MD, FACS °General & Endocrine Surgery °[facility] °Office: [phone] ° °Website: [email or web address] ° ° °

## 2015-03-16 NOTE — Transfer of Care (Signed)
Immediate Anesthesia Transfer of Care Note  Patient: [name]  Procedure(s) Performed: Procedure(s): PARATHYROIDECTOMY AND NECK EXPLORATION (N/A)  Patient Location: PACU  Anesthesia Type:General  Level of Consciousness: awake  Airway & Oxygen Therapy: Patient Spontanous Breathing and Patient connected to nasal cannula oxygen  Post-op Assessment: Report given to RN and Post -op Vital signs reviewed and stable  Post vital signs: Reviewed and stable  Last Vitals:  Filed Vitals:   03/16/15 0926  BP: 117/55  Pulse: 79  Temp:   Resp:     Complications: No apparent anesthesia complications

## 2015-03-16 NOTE — Progress Notes (Signed)
Patient ID: [name], female   DOB: [birth year], 64 y.o.   MRN: [id number]  General Surgery [facility]  POD#: 1  Subjective: Patient pleasant, mild pain.  Tolerating diet.  Objective: Vital signs in last 24 hours: Temp:  [97.8 F (36.6 C)-98.2 F (36.8 C)] 98.1 F (36.7 C) (06/24 0444) Pulse Rate:  [63-79] 79 (06/24 0926) Resp:  [11-18] 18 (06/24 0444) BP: (103-148)/(48-64) 117/55 mmHg (06/24 0926) SpO2:  [95 %-99 %] 97 % (06/24 0444) Weight:  [67.04 kg (147 lb 12.7 oz)] 67.04 kg (147 lb 12.7 oz) (06/24 0500) Last BM Date: 03/15/15  Intake/Output from previous day: 06/23 0701 - 06/24 0700 In: 1477 [P.O.:522; I.V.:955] Out: 1700 [Urine:1700] Intake/Output this shift: Total I/O In: 360 [P.O.:360] Out: 1500 [Urine:1500]  Physical Exam: HEENT - sclerae clear, mucous membranes moist Neck - wound dry and intact; voice normal; mild STS Chest - clear bilaterally Cor - RRR Ext - no edema, non-tender Neuro - alert & oriented, no focal deficits  Lab Results:   Recent Labs  03/15/15 0555  WBC 6.6  HGB 11.4*  HCT 34.3*  PLT PLATELET CLUMPS NOTED ON SMEAR, COUNT APPEARS DECREASED   BMET  Recent Labs  03/16/15 0552 03/16/15 1032  NA 133* 134*  K 6.1* 5.3*  CL 105 104  CO2 20* 23  GLUCOSE 125* 137*  BUN 15 16  CREATININE 1.07* 0.95  CALCIUM 10.5* 11.3*   PT/INR No results for input(s): LABPROT, INR in the last 72 hours. Comprehensive Metabolic Panel:    Component Value Date/Time   NA 134* 03/16/2015 1032   NA 133* 03/16/2015 0552   NA 144 03/01/2015 0245   NA 136 02/22/2015 1027   K 5.3* 03/16/2015 1032   K 6.1* 03/16/2015 0552   CL 104 03/16/2015 1032   CL 105 03/16/2015 0552   CO2 23 03/16/2015 1032   CO2 20* 03/16/2015 0552   BUN 16 03/16/2015 1032   BUN 15 03/16/2015 0552   BUN 5* 03/01/2015 0245   BUN 14 02/22/2015 1027   CREATININE 0.95 03/16/2015 1032   CREATININE 1.07* 03/16/2015 0552   GLUCOSE 137* 03/16/2015  1032   GLUCOSE 125* 03/16/2015 0552   GLUCOSE 114* 03/01/2015 0245   GLUCOSE 132* 02/22/2015 1027   CALCIUM 11.3* 03/16/2015 1032   CALCIUM 10.5* 03/16/2015 0552   CALCIUM 10.2 03/12/2015 0632   AST 24 03/08/2015 2046   AST 19 02/26/2015 0430   ALT 21 03/08/2015 2046   ALT 18 02/26/2015 0430   ALKPHOS 48 03/08/2015 2046   ALKPHOS 45 02/26/2015 0430   BILITOT 0.6 03/08/2015 2046   BILITOT 0.4 02/26/2015 0430   BILITOT 0.5 01/25/2015 1210   PROT 6.7 03/08/2015 2046   PROT 5.9* 02/26/2015 0430   PROT 6.5 01/25/2015 1210   PROT 6.6 07/18/2014 1253   ALBUMIN 4.0 03/08/2015 2046   ALBUMIN 3.1* 02/26/2015 0430    Studies/Results: No results found.  Anti-infectives: Anti-infectives    Start     Dose/Rate Route Frequency Ordered Stop   03/15/15 1315  ceFAZolin (ANCEF) IVPB 2 g/50 mL premix  Status:  Discontinued     2 g 100 mL/hr over 30 Minutes Intravenous To [facility] 03/14/15 1310 03/14/15 1314   03/15/15 1315  ceFAZolin (ANCEF) IVPB 2 g/50 mL premix     2 g 100 mL/hr over 30 Minutes Intravenous To [facility]Stay Surgical 03/14/15 1314 03/15/15 1336   03/14/15 1315  ceFAZolin (ANCEF) IVPB 2 g/50 mL premix  Status:  Discontinued     2 g 100 mL/hr over 30 Minutes Intravenous On call to O.R. 03/14/15 0832 03/14/15 1305      Assessment & Plans: Status post left superior parathyroidectomy  Calcium level 10.5 and 11.3 post op - not the significant drop expected - will monitor  Pain well controlled  Wound looks good  Would normally discharge home on POD#1  Multiple medical concerns being addressed by medical team - discharge when medically stable and cleared by medical service  Will see in office 3 weeks with repeat calcium level and intact-PTH level  Surgical discharge instructions on chart  Velora Heckler, MD, [facility] Office: [phone]   Caylor Cerino Judie Petit 03/16/2015

## 2015-03-16 NOTE — Progress Notes (Signed)
Critical K+ 6.1 . Claiborne Billings, NP notified.

## 2015-03-16 NOTE — Progress Notes (Signed)
Notified Dr. Benjamine Mola about K 6.1.  Will repeat K this AM.

## 2015-03-16 NOTE — Progress Notes (Signed)
Patient complaining of pain "9" at surgical site. Patient refusing dilaudid 1mg  IV prn at this time. States she would like to try 2 tablets of vicodin. Patient educated on moderate vs severe pain policy. Pt requesting vicodin.Will continue to monitor.

## 2015-03-16 NOTE — Progress Notes (Signed)
Reviewed discharge paperwork with pt.  PIVs removed.  Pt denied any needs at this time.  Home medications returned from pharmacy.  Pt taken to discharge location via wheelchair.

## 2015-03-16 NOTE — Progress Notes (Signed)
PT Cancellation Note  Patient Details Name: [name] MRN: [id number] DOB: 08-25-1951   Cancelled Treatment:    Reason Eval/Treat Not Completed: PT screened, no needs identified, will sign off. Pt sitting EOB and politely declined PT stating she has been getting up in room without difficulty. Noted pt seen by acute PT 4 weeks ago and was independent with functional mobility.  Pt states someone will be staying with her when she goes home.  Will sign off. If status changes please re-order. Clydie Braun L. Katrinka Blazing, [place] Pager [phone] 03/16/2015    [name] 03/16/2015, 12:28 PM

## 2015-03-16 NOTE — Discharge Summary (Addendum)
Physician Discharge Summary  [name] ZOX:[id number] DOB: 08/22/1951 DOA: 03/08/2015  PCP: [name], NP  Admit date: 03/08/2015 Discharge date: 03/16/2015  Time spent: 35 minutes  Recommendations for Outpatient Follow-up:  1. BMp 1 week Dr. Gerrit Friends: 3 weeks with repeat calcium level and intact-PTH level  Discharge Diagnoses:  Principal Problem:   Hyperparathyroidism, primary Active Problems:   Schizophrenia   Primary hyperparathyroidism   Essential hypertension   Hypercalcemia   Weak   Hyperkalemia   Discharge Condition: improved  Diet recommendation: cardiac  Filed Weights   03/14/15 0541 03/15/15 0537 03/16/15 0500  Weight: 70.3 kg (154 lb 15.7 oz) 67.042 kg (147 lb 12.8 oz) 67.04 kg (147 lb 12.7 oz)    History of present illness:  64 yo female h/o primary hyperparathyroidism, htn sent in from PCP office for fatigue. Pt reports when she went to see her pcp today she was very tired and needed to take a nap. She did not have any focal neurological deficits. She denies any pain anywhere. No fevers. No n/v/d. She is being arranged as an outpt to have surgical removal of her parathyroids. Pt does not really know why she was sent to the hospital. Husband is also present and reports that his wife was just tired and sleepy today. Pt denies any issues with sleeping at night. She was directly admitted in order to expediate her surgery as an inpatient. No labs done in pcp office today. Pt denies any abnormality in gait.  Hospital Course:  Status post left superior parathyroidectomy Calcium level 10.5 and 11.3 post op - not the significant drop expected - will monitor Pain well controlled Wound looks good    Dr. Gerrit Friends:  Will see in office 3 weeks with repeat calcium level and intact-PTH level Surgical discharge instructions on chart  Hyperkalemia -kayexelate -d/c supplements  Schizophrenia:  currently oriented X 3 and denies any hallucinations -- poor insight into disease process--- insisting on d/c due to "losing" her home if she is not d/'cd today -will continue mellaril  glaucoma: will continue xalatan and trusopt  GERD (gastroesophageal reflux disease): continue protonix  Essential hypertension: -patient started on heart healthy diet -controlle  AKI:  -resolved -d/c ACE due to K and Cr  Procedures:  left superior parathyroidectomy  Consultations:  surgery  Discharge Exam: Filed Vitals:   03/16/15 0926  BP: 117/55  Pulse: 79  Temp:   Resp:     General: anxious to be d/c'd --says she will lose house if not d/c'd today Cardiovascular: rrr Respiratory: clear  Discharge Instructions   Discharge Instructions    Diet - low sodium heart healthy    Complete by:  As directed      Discharge instructions    Complete by:  As directed   Dr. Gerrit Friends: 3 weeks with repeat calcium level and intact-PTH level BMp 1 week re K     Increase activity slowly    Complete by:  As directed           Current Discharge Medication List    CONTINUE these medications which have NOT CHANGED   Details  atenolol (TENORMIN) 50 MG tablet TAKE 1 TABLET BY MOUTH EVERY DAY Qty: 30 tablet, Refills: 5    atorvastatin (LIPITOR) 20 MG tablet Take 1 tablet (20 mg total) by mouth daily. Qty: 90 tablet, Refills: 3    dorzolamide (TRUSOPT) 2 % ophthalmic solution Place 1 drop into both eyes 3 (three) times daily.    latanoprost (  XALATAN) 0.005 % ophthalmic solution Place 1 drop into both eyes at bedtime.    LUMIGAN 0.01 % SOLN Place 1 drop into both eyes at bedtime. Refills: 12    magnesium gluconate (MAGONATE) 500 MG tablet Take 1 tablet (500 mg total) by mouth daily. Qty: 30 tablet, Refills: 0    thioridazine (MELLARIL) 50 MG tablet TAKE 1 OR 2 TABLETS BY MOUTH AT BEDTIME Qty: 30 tablet, Refills: 1      STOP taking these medications     alendronate (FOSAMAX) 70 MG tablet       cinacalcet (SENSIPAR) 30 MG tablet      enalapril (VASOTEC) 10 MG tablet      potassium chloride SA (K-DUR,KLOR-CON) 20 MEQ tablet        No Known Allergies Follow-up Information    Follow up with Velora Heckler, MD. Schedule an appointment as soon as possible for a visit in 3 weeks.   Specialty:  General Surgery   Why:  For wound re-check   Contact information:   [address] [place] [address] [phone]       Follow up with Abbey Chatters K, NP In 1 week.   Specialty:  Nurse Practitioner   Why:  BMP   Contact information:   [address] [place] [address] [phone]        The results of significant diagnostics from this hospitalization (including imaging, microbiology, ancillary and laboratory) are listed below for reference.    Significant Diagnostic Studies: X-ray Chest Pa And Lateral  03/08/2015   CLINICAL DATA:  Weakness, fatigue for 1 day  EXAM: CHEST  2 VIEW  COMPARISON:  None.  FINDINGS: The heart size and mediastinal contours are within normal limits. Both lungs are clear. The visualized skeletal structures are unremarkable.  IMPRESSION: No active cardiopulmonary disease.   Electronically Signed   By: Elige Ko   On: 03/08/2015 20:01   Ct Soft Tissue Neck W Contrast  03/09/2015   CLINICAL DATA:  64 year old female with hyperparathyroidism. Hypercalcemia. Query abnormal parathyroid gland. Initial encounter.  EXAM: CT NECK WITH CONTRAST  TECHNIQUE: Multidetector CT imaging of the neck was performed using the standard protocol following the bolus administration of intravenous contrast.  CONTRAST:  11mL OMNIPAQUE IOHEXOL 300 MG/ML  SOLN  COMPARISON:  Nuclear medicine sestamibi parathyroid scan 04/23/2011  FINDINGS: Pharynx and larynx: Negative larynx. Pharyngeal soft tissue contours are within normal limits. Parapharyngeal spaces and retropharyngeal space are within normal limits.  Salivary glands: Submandibular and parotid glands  are within normal limits. Negative sublingual space.  Thyroid: Multiple subcentimeter hypodense thyroid nodules, the largest are 5 mm and do not meet size criteria for ultrasound follow-up.  Nearly isodense to the thyroid parenchyma but appearing somewhat distinct from the superior left pole and extending farther cephalad than expected for thyroid parenchyma there is a triangular 6 x 13 by 24 mm soft tissue focus along the lateral aspect of the left retropharyngeal space, a immediately medial to the left common carotid artery. See coronal image 53 and series 2, image 59. This is suspicious for parathyroid tissue.  Lymph nodes: No lymphadenopathy. Cervical lymph nodes are within normal limits.  Vascular: Major vascular structures in the neck and at the skullbase are patent. Mild soft and calcified atherosclerotic plaque at the carotid bifurcations. Calcified Coronary artery atherosclerosis is partially visible (series 2, image 129.  Limited intracranial: Negative.  Visualized orbits: Negative.  Mastoids and visualized paranasal sinuses: Negative.  Skeleton: Carious  dentition. Elongated left greater than right stylohyoid ligament calcification. Widespread cervical disc degeneration. Calcified ligamentous hypertrophy about the odontoid. No acute osseous abnormality identified.  Upper chest: Dependent pulmonary atelectasis. Right upper lobe bronchiectasis. No superior mediastinal lymphadenopathy. No superior mediastinal nodules suspicious for parathyroid tissue.  IMPRESSION: 1. Triangular enhancing soft tissue superior to the left thyroid lobe suspicious for parathyroid adenoma in this setting. Recommend repeat Nuclear Medicine Sestamibi scan with SPECT and attention to this area. 2. Otherwise negative neck and superior mediastinal soft tissues. 3. Calcified Coronary artery atherosclerosis.  Carious dentition.   Electronically Signed   By: Odessa Fleming M.D.   On: 03/09/2015 18:50    Microbiology: Recent Results (from the  past 240 hour(s))  Surgical pcr screen     Status: None   Collection Time: 03/14/15  4:50 PM  Result Value Ref Range Status   MRSA, PCR NEGATIVE NEGATIVE Final   Staphylococcus aureus NEGATIVE NEGATIVE Final    Comment:        The Xpert SA Assay (FDA approved for NASAL specimens in patients over 25 years of age), is one component of a comprehensive surveillance program.  Test performance has been validated by [facility] for patients greater than or equal to 65 year old. It is not intended to diagnose infection nor to guide or monitor treatment.      Labs: Basic Metabolic Panel:  Recent Labs Lab 03/10/15 0438 03/11/15 0300  03/13/15 0610 03/14/15 0515 03/15/15 0555 03/16/15 0552 03/16/15 1032  NA 139 141  < > 141 141 140 133* 134*  K 3.8 3.3*  < > 4.3 5.0 5.6* 6.1* 5.3*  CL 115* 115*  < > 116* 112* 113* 105 104  CO2 20* 19*  < > 20* 23  GLUCOSE 90 106*  < > 99 80 94 125* 137*  BUN 6 <5*  < > <5* 5* CREATININE 0.87 0.76  < > 0.71 0.90 0.90 1.07* 0.95  CALCIUM 10.9* 10.7*  < > 10.3 10.6* 12.0* 10.5* 11.3*  MG 1.0* 1.2*  --  1.1* 1.5* 1.6*  --   --   PHOS  --  2.2*  --   --   --   --   --   --   < > = values in this interval not displayed. Liver Function Tests: No results for input(s): AST, ALT, ALKPHOS, BILITOT, PROT, ALBUMIN in the last 168 hours. No results for input(s): LIPASE, AMYLASE in the last 168 hours. No results for input(s): AMMONIA in the last 168 hours. CBC:  Recent Labs Lab 03/11/15 0300 03/15/15 0555  WBC 6.2 6.6  HGB 10.3* 11.4*  HCT 31.6* 34.3*  MCV 91.1 90.7  PLT 157 PLATELET CLUMPS NOTED ON SMEAR, COUNT APPEARS DECREASED   Cardiac Enzymes: No results for input(s): CKTOTAL, CKMB, CKMBINDEX, TROPONINI in the last 168 hours. BNP: BNP (last 3 results) No results for input(s): BNP in the last 8760 hours.  ProBNP (last 3 results) No results for input(s): PROBNP in the last 8760 hours.  CBG: No results for input(s):  GLUCAP in the last 168 hours.     SignedMarlin Canary  [facility] 03/16/2015, 2:00 PM

## 2015-03-16 NOTE — Patient Outreach (Signed)
Per EPIC, patient remains in acute care setting at [facility].  Plan: Will follow up Monday, June 27 for case management assessment of needs

## 2015-03-16 NOTE — Telephone Encounter (Signed)
Refill request came in and this needs to be sent to Dr. Abbey Chatters.

## 2015-03-19 ENCOUNTER — Other Ambulatory Visit: Payer: Self-pay

## 2015-03-19 NOTE — Patient Outreach (Signed)
Unsuccessful attempt made to contact patient via telephone at all numbers listed. Message left with this RNCM contact information included in HIPPA compliant message.    Plan: Make another attempt to contact patient on Wednesday, June 29

## 2015-03-20 ENCOUNTER — Telehealth: Payer: Self-pay | Admitting: *Deleted

## 2015-03-20 ENCOUNTER — Other Ambulatory Visit: Payer: Self-pay | Admitting: *Deleted

## 2015-03-20 ENCOUNTER — Ambulatory Visit: Payer: Medicare Other

## 2015-03-20 NOTE — Telephone Encounter (Signed)
Patient notified and understood.

## 2015-03-20 NOTE — Telephone Encounter (Signed)
Patient called and wants to know if she should continue her Potassium until she sees you on Tuesday, 03/27/2015. Had a parathyroidectomy done and will be following up then. In discharge note it looks like hospital discontinued the potassium. Please Advise.

## 2015-03-20 NOTE — Telephone Encounter (Signed)
She is NOT to be on potassium

## 2015-03-21 ENCOUNTER — Other Ambulatory Visit: Payer: Self-pay

## 2015-03-21 NOTE — Patient Outreach (Signed)
This RNCM was finally able to reach patient by telephone for transition of care call. Patient identified herself using HIPPA identifiers.  Patient stated she is good, has all her medications and able to afford her medication  Plan Telephone contact next week for week 2 transition of care call.

## 2015-03-27 ENCOUNTER — Encounter: Payer: Self-pay | Admitting: Nurse Practitioner

## 2015-03-27 ENCOUNTER — Ambulatory Visit (INDEPENDENT_AMBULATORY_CARE_PROVIDER_SITE_OTHER): Payer: Medicare Other | Admitting: Nurse Practitioner

## 2015-03-27 VITALS — BP 122/78 | HR 99 | Temp 98.4°F | Resp 20 | Ht 62.0 in | Wt 153.6 lb

## 2015-03-27 DIAGNOSIS — F209 Schizophrenia, unspecified: Secondary | ICD-10-CM | POA: Diagnosis not present

## 2015-03-27 DIAGNOSIS — E785 Hyperlipidemia, unspecified: Secondary | ICD-10-CM

## 2015-03-27 DIAGNOSIS — I10 Essential (primary) hypertension: Secondary | ICD-10-CM | POA: Diagnosis not present

## 2015-03-27 DIAGNOSIS — E210 Primary hyperparathyroidism: Secondary | ICD-10-CM

## 2015-03-27 NOTE — Progress Notes (Signed)
Patient ID: [name], female   DOB: [birth year], 64 y.o.   MRN: [id number]    PCP: [name], NP  No Known Allergies  Chief Complaint  Patient presents with  . Hospitalization Follow-up    follow-up from surgery     HPI: Patient is a 64 y.o. female seen in the office today to follow up hospitalization. Pt with a pmh primary hyperparathyroidism, htn recently hospitalized due to ongoing elevated calcium levels, pt was taken to the OR and is now Status post left superior parathyroidectomy. pts calcium level was 10.5 and 11.3 post op - needing follow up labs today.   reports she feels great.  Not taking her magnesium.  Reports it was not called into the drug store.  Family is helping her out around the house, helping her with chores and preparing meals Good intake. No changes in bowel or bladder  Review of Systems:  Review of Systems  Constitutional: Negative for fever, chills, activity change, appetite change, fatigue and unexpected weight change.  Eyes: Negative for visual disturbance.  Respiratory: Negative for cough, chest tightness and shortness of breath.   Cardiovascular: Negative for chest pain, palpitations and leg swelling.  Gastrointestinal: Negative for nausea, abdominal pain, diarrhea, constipation and abdominal distention.  Genitourinary: Negative for difficulty urinating.  Skin: Negative for pallor and rash.  Neurological: Negative for dizziness, weakness, light-headedness and headaches.  Psychiatric/Behavioral: Negative for suicidal ideas and sleep disturbance.    Past Medical History  Diagnosis Date  . Hyperparathyroidism   . Hypertension   . Osteopenia   . Vitamin D deficiency   . Cough   . Other abnormal blood chemistry   . Chlamydia trachomatis infection of unspecified genitourinary site 12/18/2011  . Disorder of bone and cartilage, unspecified   . Edema   . Tobacco use disorder   . Tachycardia, unspecified 05/29/2010  . Unspecified  glaucoma   . Hypercalcemia 02/14/2015  . Hyperlipemia   . Schizophrenia     "little bit" (02/23/2015)   Past Surgical History  Procedure Laterality Date  . No past surgeries    . Parathyroidectomy N/A 03/15/2015    Procedure: PARATHYROIDECTOMY AND NECK EXPLORATION;  Surgeon: Darnell Level, MD;  Location[facility] OR;  Service: General;  Laterality: N/A;   Social History:   reports that she has quit smoking. Her smoking use included Cigarettes. She has a 5.64 pack-year smoking history. She has never used smokeless tobacco. She reports that she drinks alcohol. She reports that she does not use illicit drugs.  Family History  Problem Relation Age of Onset  . Hypertension Other   . Diabetes Other   . Cancer Mother   . Cancer Brother     Throat    Medications: Patient's Medications  New Prescriptions   No medications on file  Previous Medications   ATENOLOL (TENORMIN) 50 MG TABLET    TAKE 1 TABLET BY MOUTH EVERY DAY   ATORVASTATIN (LIPITOR) 20 MG TABLET    Take 1 tablet (20 mg total) by mouth daily.   DORZOLAMIDE (TRUSOPT) 2 % OPHTHALMIC SOLUTION    Place 1 drop into both eyes 3 (three) times daily.   LATANOPROST (XALATAN) 0.005 % OPHTHALMIC SOLUTION    Place 1 drop into both eyes at bedtime.   LUMIGAN 0.01 % SOLN    Place 1 drop into both eyes at bedtime.   MAGNESIUM GLUCONATE (MAGONATE) 500 MG TABLET    Take 1 tablet (500 mg total) by mouth daily.   THIORIDAZINE (  MELLARIL) 50 MG TABLET    TAKE 1 OR 2 TABLETS BY MOUTH AT BEDTIME  Modified Medications   No medications on file  Discontinued Medications   No medications on file     Physical Exam:  Filed Vitals:   03/27/15 1013  BP: 122/78  Pulse: 99  Temp: 98.4 F (36.9 C)  TempSrc: Oral  Resp: 20  Height: 5\' 2"  (1.575 m)  Weight: 153 lb 9.6 oz (69.673 kg)  SpO2: 97%    Physical Exam  Constitutional: No distress.  Neck: Normal range of motion. Neck supple.  Cardiovascular: Normal rate, regular rhythm and normal heart sounds.    Pulmonary/Chest: Effort normal and breath sounds normal.  Abdominal: Soft. Bowel sounds are normal.  Musculoskeletal: She exhibits no edema or tenderness.  Neurological: She is alert.  Skin: Skin is warm and dry. She is not diaphoretic.  Neck incision well healing with steristrips   Psychiatric: She has a normal mood and affect.    Labs reviewed: Basic Metabolic Panel:  Recent Labs  16/06/9605/03/16 2007  03/08/15 2046  03/11/15 0300  03/13/15 0610 03/14/15 0515 03/15/15 0555 03/16/15 0552 03/16/15 1032  NA  --   < > 139  < > 141  < > 141 141 140 133* 134*  K  --   < > 4.2  < > 3.3*  < > 4.3 5.0 5.6* 6.1* 5.3*  CL  --   < > 108  < > 115*  < > 116* 112* 113* 105 104  CO2  --   < > 24  < > 19*  < > 22 23 22  20* 23  GLUCOSE  --   < > 103*  < > 106*  < > 99 80 94 125* 137*  BUN  --   < > 9  < > <5*  < > <5* 5* 6 15 16   CREATININE  --   < > 1.27*  < > 0.76  < > 0.71 0.90 0.90 1.07* 0.95  CALCIUM  --   < > 14.4*  < > 10.7*  < > 10.3 10.6* 12.0* 10.5* 11.3*  MG 1.0*  < > 0.8*  < > 1.2*  --  1.1* 1.5* 1.6*  --   --   PHOS 2.7  --  2.7  --  2.2*  --   --   --   --   --   --   TSH  --   --  3.106  --   --   --   --   --   --   --   --   < > = values in this interval not displayed. Liver Function Tests:  Recent Labs  02/25/15 0455 02/26/15 0430 03/08/15 2046  AST 20 19 24   ALT 18 18 21   ALKPHOS 43 45 48  BILITOT 0.5 0.4 0.6  PROT 5.4* 5.9* 6.7  ALBUMIN 3.2* 3.1* 4.0   No results for input(s): LIPASE, AMYLASE in the last 8760 hours. No results for input(s): AMMONIA in the last 8760 hours. CBC:  Recent Labs  02/14/15 1041  02/23/15 1254  03/08/15 2046 03/11/15 0300 03/15/15 0555  WBC 8.2  < > 7.0  < > 6.9 6.2 6.6  NEUTROABS 4.7  --  4.0  --  4.0  --   --   HGB 14.0  < > 13.2  < > 11.9* 10.3* 11.4*  HCT 42.3  < > 40.9  < > 37.0 31.6* 34.3*  MCV 92.6  < > 92.7  < > 92.3 91.1 90.7  PLT 210  < > 201  < > 172 157 PLATELET CLUMPS NOTED ON SMEAR, COUNT APPEARS DECREASED  < > =  values in this interval not displayed. Lipid Panel:  Recent Labs  07/18/14 1253  CHOL 145  HDL 41  LDLCALC 61  TRIG 216*  CHOLHDL 3.5   TSH:  Recent Labs  03/08/15 2046  TSH 3.106   A1C: Lab Results  Component Value Date   HGBA1C 5.7* 07/18/2014     Assessment/Plan 1. Hyperparathyroidism, primary S/p left superior parathyroidectomy -incision looks good, healing well, has follow up with Dr Gerrit Friends scheduled.  -educated to get mag OTC, can ask pharmacy staff to help her find it if needed - Comprehensive metabolic panel  2. Hypercalcemia -will follow up lab work at this time - Comprehensive metabolic panel  3. Essential hypertension -blood pressure controlled on current regimen, conts on atenolol   4. Schizophrenia, unspecified type Mood stable, conts on mellaril   5. Hyperlipidemia conts on lipitor   Armel Rabbani K. Biagio Borg  [facility] [phone] 8 am - 5 pm) [phone] (after hours)

## 2015-03-27 NOTE — Patient Instructions (Signed)
To take magnesium gluconate 500 mg daily-- this is Over the counter Lab work today Keep appt with Dr Gerrit FriendsGerkin  Follow up in 3 months

## 2015-03-28 LAB — COMPREHENSIVE METABOLIC PANEL
A/G RATIO: 1.7 (ref 1.1–2.5)
ALK PHOS: 72 IU/L (ref 39–117)
ALT: 39 IU/L — AB (ref 0–32)
AST: 27 IU/L (ref 0–40)
Albumin: 3.9 g/dL (ref 3.6–4.8)
BILIRUBIN TOTAL: 0.2 mg/dL (ref 0.0–1.2)
BUN/Creatinine Ratio: 10 — ABNORMAL LOW (ref 11–26)
BUN: 10 mg/dL (ref 8–27)
CHLORIDE: 106 mmol/L (ref 97–108)
CO2: 19 mmol/L (ref 18–29)
Calcium: 9 mg/dL (ref 8.7–10.3)
Creatinine, Ser: 1 mg/dL (ref 0.57–1.00)
GFR calc Af Amer: 70 mL/min/{1.73_m2} (ref >59)
GFR calc non Af Amer: 61 mL/min/{1.73_m2} (ref >59)
GLUCOSE: 122 mg/dL — AB (ref 65–99)
Globulin, Total: 2.3 g/dL (ref 1.5–4.5)
POTASSIUM: 3.8 mmol/L (ref 3.5–5.2)
Sodium: 145 mmol/L — ABNORMAL HIGH (ref 134–144)
TOTAL PROTEIN: 6.2 g/dL (ref 6.0–8.5)

## 2015-03-29 ENCOUNTER — Other Ambulatory Visit: Payer: Self-pay

## 2015-04-02 DIAGNOSIS — E079 Disorder of thyroid, unspecified: Secondary | ICD-10-CM | POA: Diagnosis not present

## 2015-04-05 ENCOUNTER — Ambulatory Visit: Payer: Medicare Other

## 2015-04-13 DIAGNOSIS — H4011X2 Primary open-angle glaucoma, moderate stage: Secondary | ICD-10-CM | POA: Diagnosis not present

## 2015-04-18 ENCOUNTER — Other Ambulatory Visit: Payer: Self-pay

## 2015-04-18 DIAGNOSIS — E1165 Type 2 diabetes mellitus with hyperglycemia: Secondary | ICD-10-CM

## 2015-04-23 NOTE — Patient Outreach (Signed)
[facility] service [facility]) Care Management  04/23/2015  DONDA FRIEDLI 1951-01-23 [id number]   Request from Emilia Beck, RN to assigned SW, assigned Dickie La, [place].  Corrie Mckusick. Sharlee Blew [facility] Care Management [facility] CM Assistant Phone: [phone] Fax: [phone]

## 2015-04-24 ENCOUNTER — Other Ambulatory Visit: Payer: Self-pay | Admitting: Licensed Clinical Social Worker

## 2015-04-24 NOTE — Patient Outreach (Signed)
[facility] Customer service manager [facility]) Care Management  04/24/2015  JASNOOR TRUSSELL 1951-03-12 [id number]   Notification from Emilia Beck, RN to mark patient as active.  Corrie Mckusick. Sharlee Blew [facility] Care Management [facility] CM Assistant Phone: [phone] Fax: [phone]

## 2015-04-24 NOTE — Patient Outreach (Signed)
[facility]) Care Management  04/24/2015  [name] [birth year] [id number]   Assessment-CSW contacted patient via referral from [facility] Emilia Beck for assistance with Medicaid application. CSW successfully got in touch with patient and received HIPPA verifications. CSW informed patient of reason for call and of [facility] care management services. Patient agrees that she would like assistance completing medicaid application. CSW agreed to mail out Medicaid check off list for application to patient.   Plan-CSW scheduled initial home visit for 05/03/15 to start assistance with medicaid application.   Dickie La, BSW, MSW, LCSW [email or web address] Phone: [phone] Fax: [phone]

## 2015-04-25 ENCOUNTER — Other Ambulatory Visit: Payer: Self-pay

## 2015-04-25 NOTE — Patient Outreach (Signed)
This RNCM was successful in making telephone contact with patient. Patient identified herself using HIPPA identifiers.  Patient was agreeable to telephonic assessment for community care coordination needs. Patient informed this RNCM of her upcoming appointment on Monday for oral surgery.  Patient states she is going to have to make payment arrangements with physician due to inability to afford cost out of pocket.  Patient confirms receiving call from Dickie La, LCSW who has is consulting to assist patient with medicaid application. This RNCM reviewed the Medicaid application process with patient.  Patient states agreement and stated she believes having Medicaid would benefit her a whole lot.   Plan: Telephone week of August 8 to schedule home visit.

## 2015-05-03 ENCOUNTER — Other Ambulatory Visit: Payer: Self-pay | Admitting: Licensed Clinical Social Worker

## 2015-05-03 NOTE — Patient Outreach (Signed)
[facility]) Care Management  [facility] Social Work  05/03/2015  [name] 1951-08-09 [id number]    Current Medications:  Current Outpatient Prescriptions  Medication Sig Dispense Refill  . atenolol (TENORMIN) 50 MG tablet TAKE 1 TABLET BY MOUTH EVERY DAY 30 tablet 5  . atorvastatin (LIPITOR) 20 MG tablet Take 1 tablet (20 mg total) by mouth daily. 90 tablet 3  . dorzolamide (TRUSOPT) 2 % ophthalmic solution Place 1 drop into both eyes 3 (three) times daily.    Marland Kitchen latanoprost (XALATAN) 0.005 % ophthalmic solution Place 1 drop into both eyes at bedtime.    Marland Kitchen LUMIGAN 0.01 % SOLN Place 1 drop into both eyes at bedtime.  12  . magnesium gluconate (MAGONATE) 500 MG tablet Take 1 tablet (500 mg total) by mouth daily. (Patient not taking: Reported on 03/27/2015) 30 tablet 0  . thioridazine (MELLARIL) 50 MG tablet TAKE 1 OR 2 TABLETS BY MOUTH AT BEDTIME 30 tablet 1   No current facility-administered medications for this visit.    Functional Status:  In your present state of health, do you have any difficulty performing the following activities: 04/25/2015 03/30/2015  Hearing? N -  Vision? Y -  Difficulty concentrating or making decisions? N -  Walking or climbing stairs? Y -  Dressing or bathing? N -  Doing errands, shopping? N -  Preparing Food and eating ? N Y  Using the Toilet? N N  In the past six months, have you accidently leaked urine? N N  Do you have problems with loss of bowel control? N N  Managing your Medications? N N  Managing your Finances? N N  Housekeeping or managing your Housekeeping? N Y    Fall/Depression Screening:  PHQ 2/9 Scores 04/25/2015 03/30/2015 03/08/2015 01/25/2015 07/18/2014 07/18/2014 03/15/2014  PHQ - 2 Score 0 0 0 0 0 0 0  PHQ- 9 Score - - - - - - 0    Assessment: CSW arrived to patient's home on 05/03/15 for initial home visit. Patient stated that she would have to be leaving soon to pick up dentures and would not have time to complete  initial assessment. CSW reviewed medicaid application with patient as she has requested assistance with this. Patient stated that she is still legally married to her husband but that he does not live with her and that they have been separated for some time now. CSW had concerns that since they still share an address that his income and information would need to be included in application. CSW called DSS to clarify and they agreed. Patient reported that their income and life insurances would not make her eligible for medicaid at this time and that she did not want to go through application if she would get denied. CSW suggested for patient to still apply for medicaid at DSS and CSW would be present to help assist. Patient declined and stated that this would not be a good time as she has many important health care appointments and would need to ask her husband to remove himself from her current address. Patient agreed that after her health appointments and husband's address change that she would then be willing to go to DSS and apply for medicaid. Patient was interested in applying for Food Stamps and CSW completed application with patient. Once application was completed patient stated that she would need to be leaving at this time. CSW informed patient that food stamp application will be both mailed and faxed today and what  the next steps would look like.  Plan: CSW has both faxed and mailed food stamp application to DSS. CSW will make outreach telephone call next month.  .,Dickie La, BSW, MSW, LCSW [email or web address] Phone: [phone] Fax: [phone]    Plan:

## 2015-05-11 DIAGNOSIS — H2513 Age-related nuclear cataract, bilateral: Secondary | ICD-10-CM | POA: Diagnosis not present

## 2015-05-11 DIAGNOSIS — H02409 Unspecified ptosis of unspecified eyelid: Secondary | ICD-10-CM | POA: Diagnosis not present

## 2015-05-11 DIAGNOSIS — H4011X2 Primary open-angle glaucoma, moderate stage: Secondary | ICD-10-CM | POA: Diagnosis not present

## 2015-05-14 ENCOUNTER — Other Ambulatory Visit: Payer: Self-pay

## 2015-05-15 NOTE — Patient Outreach (Signed)
Initial transition of care completed after patient, who answered the telephone, satisfied HIPPA requirement by providing date of birth and address.  This RNCM and patient collaborated to formulate her community care coordination telephone call. Patient readily responded to questions, stated often during call her appreciation for life and how she is very pleased at her progress so far.   Patient and this RNCM focussed on initial assessment and risk assessment.  Patient and this RNCM agreed on next telephone contact as Monday, August 29.

## 2015-05-31 ENCOUNTER — Other Ambulatory Visit: Payer: Self-pay | Admitting: Licensed Clinical Social Worker

## 2015-05-31 NOTE — Patient Outreach (Signed)
[facility]) Care Management  05/31/2015  [name] 26-Nov-1950 [id number]  Assessment-CSW contacted patient on 05/31/15 to follow up on community resource referral. HIPPA verifications were received. Patient reported that she did indeed gain food stamps but only received $93.00 per month and she was wishing for more. CSW questioned if patienthad any updates on whether or not she was able to remove separated husband from mailing address in order to gain Medicaid and she reported she was not interested in doing that at this time. CSW expressed understanding. Patient reports that she is under a lot of stress as her uncle passed away this week and that she was very close to him. CSW expressed condolences for loss and provided reflective listening as patient shared. CSW questioned if she would be interested in grief support groups and she declined. Patient was questioned if she had any other SW needs at this time and she declined. CSW informed patient that it may be better to contact her within a few weeks to ensure no further needs. Patient agreed to this stating, "I have too much going on right now to deal with anything else."  Plan-CSW will contact patient within 2 weeks to assess for any further community resource needs.  Dickie La, BSW, MSW, LCSW [email or web address] Phone: [phone] Fax: [phone]

## 2015-06-06 ENCOUNTER — Other Ambulatory Visit: Payer: Self-pay | Admitting: Internal Medicine

## 2015-06-18 ENCOUNTER — Other Ambulatory Visit: Payer: Self-pay | Admitting: Internal Medicine

## 2015-06-18 ENCOUNTER — Other Ambulatory Visit: Payer: Self-pay | Admitting: Licensed Clinical Social Worker

## 2015-06-18 NOTE — Patient Outreach (Signed)
[facility]) Care Management  06/18/2015  [name] 1951/01/30 [id number]   Assessment-CSW completed outreach attempt to patient on 06/18/15 but was unsuccessful in reaching patient. CSW left a HIPPA compliant voice message encouraging patient to return call back.  Plan-CSW will await for return call from patient and remain available to patient for all further needs.  Dickie La, BSW, MSW, LCSW [email or web address] Phone: [phone] Fax: [phone]

## 2015-06-19 ENCOUNTER — Other Ambulatory Visit: Payer: Self-pay

## 2015-06-19 NOTE — Patient Outreach (Signed)
Unsuccessful attempt made to contact patient via telephone at telephone number [phone]. Multiple attempt have made to contact patient via telephone, however, attempts have been unsuccessful.  Plan to discharge patient from caseload because of inability to contact.

## 2015-06-20 NOTE — Patient Outreach (Signed)
[facility]) Care Management  06/20/2015  KIMBLEY SPRAGUE October 25, 1950 [id number]   Notification from Emilia Beck, RN to close case due to unable to contact patient for [facility] Care Management services.  Thanks, Corrie Mckusick. Sharlee Blew [facility] Care Management [facility] CM Assistant Phone: [phone] Fax: [phone]

## 2015-06-25 ENCOUNTER — Other Ambulatory Visit: Payer: Self-pay | Admitting: Licensed Clinical Social Worker

## 2015-06-25 NOTE — Patient Outreach (Signed)
[facility]) Care Management  06/25/2015  TARIYAH PENDRY 1950/11/24 [id number]   Assessment-CSW completed another outreach to patient but was unsuccessful in reaching her. CSW left a HIPPA compliant voice message encouraging patient to contact CSW back once she is able to.   Plan-CSW will make third outreach attempts on 06/29/15 if CSW has not heard back from patient.  Dickie La, BSW, MSW, LCSW [email or web address] Phone: [phone] Fax: [phone]

## 2015-06-27 ENCOUNTER — Other Ambulatory Visit: Payer: Self-pay

## 2015-06-27 DIAGNOSIS — Z1231 Encounter for screening mammogram for malignant neoplasm of breast: Secondary | ICD-10-CM

## 2015-06-28 ENCOUNTER — Other Ambulatory Visit: Payer: Self-pay | Admitting: Licensed Clinical Social Worker

## 2015-06-28 ENCOUNTER — Other Ambulatory Visit: Payer: Self-pay

## 2015-06-28 ENCOUNTER — Other Ambulatory Visit: Payer: Medicare Other

## 2015-06-28 DIAGNOSIS — R7303 Prediabetes: Secondary | ICD-10-CM

## 2015-06-28 DIAGNOSIS — I10 Essential (primary) hypertension: Secondary | ICD-10-CM | POA: Diagnosis not present

## 2015-06-28 DIAGNOSIS — E785 Hyperlipidemia, unspecified: Secondary | ICD-10-CM

## 2015-06-28 NOTE — Patient Outreach (Signed)
[facility]) Care Management  06/28/2015  Karlyn Agee 09/28/1950 [id number]   . Assessment-CSW attempted third outreach to patient but was unable to reach her. HIPPA compliant voice message was left for patient that encouraged her to contact [facility] CSW if still interested in receiving services.  Plan-CSW will send outreach letter to patient and wait 10 days to hear back from patient before completing case closure.  Dickie La, BSW, MSW, LCSW [email or web address] Phone: [phone] Fax: [phone]

## 2015-06-29 ENCOUNTER — Other Ambulatory Visit: Payer: Medicare Other

## 2015-06-29 DIAGNOSIS — E785 Hyperlipidemia, unspecified: Secondary | ICD-10-CM | POA: Diagnosis not present

## 2015-06-29 DIAGNOSIS — R7303 Prediabetes: Secondary | ICD-10-CM | POA: Diagnosis not present

## 2015-06-29 LAB — CBC WITH DIFFERENTIAL/PLATELET
BASOS ABS: 0 10*3/uL (ref 0.0–0.2)
Basos: 0 %
EOS (ABSOLUTE): 0.1 10*3/uL (ref 0.0–0.4)
Eos: 2 %
HEMOGLOBIN: 11.7 g/dL (ref 11.1–15.9)
Hematocrit: 35.2 % (ref 34.0–46.6)
Immature Grans (Abs): 0 10*3/uL (ref 0.0–0.1)
Immature Granulocytes: 0 %
LYMPHS ABS: 2.9 10*3/uL (ref 0.7–3.1)
Lymphs: 33 %
MCH: 30.2 pg (ref 26.6–33.0)
MCHC: 33.2 g/dL (ref 31.5–35.7)
MCV: 91 fL (ref 79–97)
MONOCYTES: 9 %
MONOS ABS: 0.8 10*3/uL (ref 0.1–0.9)
Neutrophils Absolute: 5 10*3/uL (ref 1.4–7.0)
Neutrophils: 56 %
Platelets: 133 10*3/uL — ABNORMAL LOW (ref 150–379)
RBC: 3.88 x10E6/uL (ref 3.77–5.28)
RDW: 14.4 % (ref 12.3–15.4)
WBC: 8.9 10*3/uL (ref 3.4–10.8)

## 2015-06-29 LAB — HEMOGLOBIN A1C
Est. average glucose Bld gHb Est-mCnc: 117 mg/dL
Hgb A1c MFr Bld: 5.7 % — ABNORMAL HIGH (ref 4.8–5.6)

## 2015-06-29 LAB — LIPID PANEL

## 2015-06-30 LAB — BASIC METABOLIC PANEL WITH GFR
BUN/Creatinine Ratio: 7 — ABNORMAL LOW (ref 11–26)
BUN: 8 mg/dL (ref 8–27)
CO2: 22 mmol/L (ref 18–29)
Calcium: 9.3 mg/dL (ref 8.7–10.3)
Chloride: 103 mmol/L (ref 97–108)
Creatinine, Ser: 1.11 mg/dL — ABNORMAL HIGH (ref 0.57–1.00)
GFR calc Af Amer: 62 mL/min/1.73 (ref >59)
GFR calc non Af Amer: 53 mL/min/1.73 — ABNORMAL LOW (ref >59)
Glucose: 111 mg/dL — ABNORMAL HIGH (ref 65–99)
Potassium: 3.6 mmol/L (ref 3.5–5.2)
Sodium: 143 mmol/L (ref 134–144)

## 2015-06-30 LAB — LIPID PANEL
Chol/HDL Ratio: 2.8 ratio (ref 0.0–4.4)
Cholesterol, Total: 146 mg/dL (ref 100–199)
HDL: 52 mg/dL (ref >39)
LDL Calculated: 45 mg/dL (ref 0–99)
Triglycerides: 247 mg/dL — ABNORMAL HIGH (ref 0–149)
VLDL Cholesterol Cal: 49 mg/dL — ABNORMAL HIGH (ref 5–40)

## 2015-07-02 ENCOUNTER — Encounter: Payer: Medicare Other | Admitting: Nurse Practitioner

## 2015-07-03 ENCOUNTER — Encounter: Payer: Medicare Other | Admitting: Nurse Practitioner

## 2015-07-09 ENCOUNTER — Other Ambulatory Visit: Payer: Self-pay | Admitting: Licensed Clinical Social Worker

## 2015-07-09 NOTE — Patient Outreach (Signed)
[facility] Care Management  07/09/2015  [name] 1951-06-05 [id number]   Assessment-CSW made fourth and final outreach attempt and was unable to reach patient. CSW sent outreach letter to patient on 06/28/15 and has waited 10 business days.  Plan-CSW will complete case closure at this time as CSW has been unable to maintain contact with patient. CSW will send PCP case closure letter and inform CMA [name] of case closure through in basket message.  [name], BSW, MSW, LCSW [facility] [email or web address] Phone: [phone] Fax: [phone]

## 2015-07-12 ENCOUNTER — Ambulatory Visit
Admission: RE | Admit: 2015-07-12 | Discharge: 2015-07-12 | Disposition: A | Discharge status: Home / Self Care | Payer: Medicare Other | Source: Ambulatory Visit

## 2015-07-12 DIAGNOSIS — Z1231 Encounter for screening mammogram for malignant neoplasm of breast: Secondary | ICD-10-CM | POA: Diagnosis not present

## 2015-07-17 ENCOUNTER — Other Ambulatory Visit: Payer: Self-pay | Admitting: Nurse Practitioner

## 2015-07-17 ENCOUNTER — Encounter: Payer: Medicare Other | Admitting: Nurse Practitioner

## 2015-07-17 DIAGNOSIS — R928 Other abnormal and inconclusive findings on diagnostic imaging of breast: Secondary | ICD-10-CM

## 2015-07-18 ENCOUNTER — Other Ambulatory Visit: Payer: Self-pay | Admitting: Internal Medicine

## 2015-07-23 ENCOUNTER — Ambulatory Visit
Admission: RE | Admit: 2015-07-23 | Discharge: 2015-07-23 | Disposition: A | Discharge status: Home / Self Care | Payer: Medicare Other | Source: Ambulatory Visit | Attending: Nurse Practitioner | Admitting: Nurse Practitioner

## 2015-07-23 DIAGNOSIS — R928 Other abnormal and inconclusive findings on diagnostic imaging of breast: Secondary | ICD-10-CM

## 2015-07-27 ENCOUNTER — Other Ambulatory Visit: Payer: Self-pay | Admitting: Internal Medicine

## 2015-08-09 ENCOUNTER — Other Ambulatory Visit: Payer: Self-pay | Admitting: Internal Medicine

## 2015-08-21 ENCOUNTER — Encounter: Payer: Self-pay | Admitting: Nurse Practitioner

## 2015-08-21 ENCOUNTER — Ambulatory Visit: Payer: Self-pay | Admitting: Nurse Practitioner

## 2015-08-21 DIAGNOSIS — H02409 Unspecified ptosis of unspecified eyelid: Secondary | ICD-10-CM | POA: Diagnosis not present

## 2015-08-21 DIAGNOSIS — H401132 Primary open-angle glaucoma, bilateral, moderate stage: Secondary | ICD-10-CM | POA: Diagnosis not present

## 2015-08-21 DIAGNOSIS — H2513 Age-related nuclear cataract, bilateral: Secondary | ICD-10-CM | POA: Diagnosis not present

## 2015-10-04 ENCOUNTER — Other Ambulatory Visit: Payer: Self-pay

## 2015-11-15 ENCOUNTER — Encounter: Payer: Self-pay | Admitting: Nurse Practitioner

## 2015-12-01 ENCOUNTER — Other Ambulatory Visit: Payer: Self-pay | Admitting: Nurse Practitioner

## 2015-12-19 DIAGNOSIS — H2513 Age-related nuclear cataract, bilateral: Secondary | ICD-10-CM | POA: Diagnosis not present

## 2015-12-19 DIAGNOSIS — H02409 Unspecified ptosis of unspecified eyelid: Secondary | ICD-10-CM | POA: Diagnosis not present

## 2015-12-19 DIAGNOSIS — H401132 Primary open-angle glaucoma, bilateral, moderate stage: Secondary | ICD-10-CM | POA: Diagnosis not present

## 2015-12-26 DIAGNOSIS — M899 Disorder of bone, unspecified: Secondary | ICD-10-CM | POA: Diagnosis not present

## 2015-12-26 DIAGNOSIS — E210 Primary hyperparathyroidism: Secondary | ICD-10-CM | POA: Diagnosis not present

## 2015-12-26 DIAGNOSIS — E559 Vitamin D deficiency, unspecified: Secondary | ICD-10-CM | POA: Diagnosis not present

## 2016-01-02 ENCOUNTER — Encounter: Payer: Self-pay | Admitting: Internal Medicine

## 2016-01-02 ENCOUNTER — Ambulatory Visit (INDEPENDENT_AMBULATORY_CARE_PROVIDER_SITE_OTHER): Payer: Medicare Other | Admitting: Internal Medicine

## 2016-01-02 VITALS — BP 140/70 | HR 87 | Temp 98.0°F | Resp 20 | Ht 62.0 in | Wt 164.2 lb

## 2016-01-02 DIAGNOSIS — I10 Essential (primary) hypertension: Secondary | ICD-10-CM

## 2016-01-02 DIAGNOSIS — R7303 Prediabetes: Secondary | ICD-10-CM | POA: Diagnosis not present

## 2016-01-02 DIAGNOSIS — Z9889 Other specified postprocedural states: Secondary | ICD-10-CM

## 2016-01-02 DIAGNOSIS — Z0000 Encounter for general adult medical examination without abnormal findings: Secondary | ICD-10-CM | POA: Diagnosis not present

## 2016-01-02 DIAGNOSIS — E785 Hyperlipidemia, unspecified: Secondary | ICD-10-CM

## 2016-01-02 DIAGNOSIS — M810 Age-related osteoporosis without current pathological fracture: Secondary | ICD-10-CM

## 2016-01-02 DIAGNOSIS — Z23 Encounter for immunization: Secondary | ICD-10-CM | POA: Diagnosis not present

## 2016-01-02 DIAGNOSIS — Z124 Encounter for screening for malignant neoplasm of cervix: Secondary | ICD-10-CM

## 2016-01-02 DIAGNOSIS — F209 Schizophrenia, unspecified: Secondary | ICD-10-CM | POA: Diagnosis not present

## 2016-01-02 NOTE — Progress Notes (Signed)
Patient ID: [name], female   DOB: Oct 29, 1950, 65 y.o.   MRN: [id number]   Location:   [facility]   Place of Service:   OFFICE  Patient Care Team: [name], NP as PCP - General (Nurse Practitioner)  Extended Emergency Contact Information Primary Emergency Contact: Attaway,Amanda Address: [address]          [place] [address] [place] Home Phone: [phone] Relation: Sister Secondary Emergency Contact: Levonne Hubert [place] Home Phone: [phone] Mobile Phone: [phone] Relation: Sister  Goals of Care: Advanced Directive information Advanced Directives 01/02/2016  Does patient have an advance directive? No  Would patient like information on creating an advanced directive? Yes - Lexicographer Complaint  Patient presents with  . Annual Exam    Annual Exam, would like PAP done  . Immunizations    Due Pneumonia shot    HPI: Patient is a 65 y.o. female seen in today for an annual wellness exam.  She has no concerns. She had labs drawn at Dr Tedra [place] office in the last week. Needs pap today.  HTN - stable on atenolol  Hyperlipidemia- stable on lipitor  Schizophrenia - followed by [facility]. She takes thioridazine. No auditory/visual hallucinations. No SI/HI  Glaucoma - followed by eye specialist. She uses lumigan, xalatan and trusopt gtts  Osteoporosis - stable on fosamax. Takes Vit D 20, 000 units daily. Followed by Dr Parke Simmers. Last DXA scan in Aug 2015.  Parathyroid - s/p resection  Depression screen [facility] 2/9 01/02/2016 04/25/2015 03/30/2015 03/08/2015 01/25/2015  Decreased Interest 0 0 0 0 0  Down, Depressed, Hopeless 0 0 0 0 0  PHQ - 2 Score 0 0 0 0 0  Altered sleeping - - - - -  Tired, decreased energy - - - - -  Change in appetite - - - - -  Feeling bad or failure about yourself  - - - - -  Trouble concentrating - - - - -  Moving slowly or fidgety/restless - - - - -  Suicidal thoughts - - - - -    PHQ-9 Score - - - - -    Fall Risk  01/02/2016 04/25/2015 03/30/2015 03/27/2015 03/08/2015  Falls in the past year? No No No No No  Number falls in past yr: - - - - -  Injury with Fall? - - - - -  Risk for fall due to : - Impaired vision;Medication side effect Medication side effect - -   MMSE - Mini Mental State Exam 01/02/2016 03/15/2014  Not completed: (No Data) -  Orientation to time 5 5  Orientation to Place 5 5  Registration 3 3  Attention/ Calculation 4 4  Recall 2 1  Language- name 2 objects 2 2  Language- repeat 1 1  Language- follow 3 step command 3 3  Language- read & follow direction 1 1  Write a sentence 1 1  Copy design 0 0  Total score 27 26     Health Maintenance  Topic Date Due  . Hepatitis C Screening  1951-06-18  . FOOT EXAM  08/05/1962  . OPHTHALMOLOGY EXAM  08/05/1962  . URINE MICROALBUMIN  08/05/1962  . HIV Screening  08/06/1967  . PAP SMEAR  12/18/2014  . HEMOGLOBIN A1C  12/27/2015  . ZOSTAVAX  09/22/2017 (Originally 08/05/2012)  . INFLUENZA VACCINE  04/22/2016  . MAMMOGRAM  07/22/2017  . PNEUMOCOCCAL POLYSACCHARIDE VACCINE (2) 01/01/2021  . TETANUS/TDAP  12/17/2021  . COLONOSCOPY  07/18/2024    Urinary incontinence? No  Functional Status Survey: Is the patient deaf or have difficulty hearing?: No Does the patient have difficulty seeing, even when wearing glasses/contacts?: No Does the patient have difficulty concentrating, remembering, or making decisions?: No Does the patient have difficulty walking or climbing stairs?: No Does the patient have difficulty dressing or bathing?: No Does the patient have difficulty doing errands alone such as visiting a doctor's office or shopping?: Yes Exercise? occasional Diet? Somewhat healthy Vision Screening Comments: Patients says she just had eyes exam two weeks ago Dentition: good Pain: no  Past Medical History  Diagnosis Date  . Hyperparathyroidism   . Hypertension   . Osteopenia   . Vitamin D  deficiency   . Cough   . Other abnormal blood chemistry   . Chlamydia trachomatis infection of unspecified genitourinary site 12/18/2011  . Disorder of bone and cartilage, unspecified   . Edema   . Tobacco use disorder   . Tachycardia, unspecified 05/29/2010  . Unspecified glaucoma   . Hypercalcemia 02/14/2015  . Hyperlipemia   . Schizophrenia (HCC)     "little bit" (02/23/2015)    Past Surgical History  Procedure Laterality Date  . No past surgeries    . Parathyroidectomy N/A 03/15/2015    Procedure: PARATHYROIDECTOMY AND NECK EXPLORATION;  Surgeon: Darnell Level, MD;  [facility];  Service: General;  Laterality: N/A;    Family History  Problem Relation Age of Onset  . Hypertension Other   . Diabetes Other   . Cancer Mother   . Cancer Brother     Throat    Social History   Social History  . Marital Status: Married    Spouse Name: N/A  . Number of Children: N/A  . Years of Education: N/A   Occupational History  . Not on file.   Social History Main Topics  . Smoking status: Former Smoker -- 0.12 packs/day for 47 years    Types: Cigarettes  . Smokeless tobacco: Never Used  . Alcohol Use: 0.0 oz/week    0 Standard drinks or equivalent per week     Comment: 02/23/2015 "I might have a can of beer q couple weeks; I drink a glass of wine on holidays/special occasions"  . Drug Use: No  . Sexual Activity: Yes   Other Topics Concern  . Not on file   Social History Narrative    No Known Allergies    Medication List       This list is accurate as of: 01/02/16  4:43 PM.  Always use your most recent med list.               alendronate 70 MG tablet  Commonly known as:  FOSAMAX  TAKE 1 TABLET ONCE WEEKLY TO TREAT OSTEOPOROSIS.TAKE WITH A FULL GLASS OF WATER ON AN EMPTY STOMACH.     atenolol 50 MG tablet  Commonly known as:  TENORMIN  TAKE 1 TABLET BY MOUTH EVERY DAY     atorvastatin 20 MG tablet  Commonly known as:  LIPITOR  TAKE 1 TABLET (20 MG TOTAL) BY  MOUTH DAILY.     dorzolamide 2 % ophthalmic solution  Commonly known as:  TRUSOPT  Place 1 drop into both eyes 3 (three) times daily.     latanoprost 0.005 % ophthalmic solution  Commonly known as:  XALATAN  Place 1 drop into both eyes at bedtime.     LUMIGAN 0.01 % Soln  Generic drug:  bimatoprost  Place 1 drop into both eyes at bedtime.     magnesium gluconate 500 MG tablet  Commonly known as:  MAGONATE  Take 1 tablet (500 mg total) by mouth daily.     thioridazine 50 MG tablet  Commonly known as:  MELLARIL  TAKE 1 OR 2 TABLETS BY MOUTH AT BEDTIME     Vitamin D3 10000 units Tabs  Take by mouth. Take 2 tablets by mouth once a day         Review of Systems:  Review of Systems  Unable to perform ROS: Psychiatric disorder    Physical Exam: Filed Vitals:   01/02/16 1448  BP: 140/70  Pulse: 87  Temp: 98 F (36.7 C)  TempSrc: Oral  Resp: 20  Height: 5\' 2"  (1.575 m)  Weight: 164 lb 3.2 oz (74.481 kg)  SpO2: 98%   Body mass index is 30.03 kg/(m^2). Physical Exam  Constitutional: She appears well-developed and well-nourished. No distress.  HENT:  Head: Normocephalic and atraumatic.  Right Ear: Hearing, tympanic membrane, external ear and ear canal normal.  Left Ear: Hearing, tympanic membrane, external ear and ear canal normal.  Mouth/Throat: Uvula is midline, oropharynx is clear and moist and mucous membranes are normal. She does not have dentures.  Eyes: Conjunctivae, EOM and lids are normal. Pupils are equal, round, and reactive to light. No scleral icterus.  Neck: Trachea normal and normal range of motion. Neck supple. Carotid bruit is not present. No thyroid mass and no thyromegaly present.  Cardiovascular: Normal rate, regular rhythm and intact distal pulses.  Exam reveals no gallop and no friction rub.   Murmur (1/6 SEM) heard. No carotid bruit b/l. No LE edema b/l. No calf TTP.   Pulmonary/Chest: Effort normal and breath sounds normal. She has no wheezes.  She has no rhonchi. She has no rales. She exhibits no mass. Right breast exhibits no inverted nipple, no mass, no nipple discharge, no skin change and no tenderness. Left breast exhibits no inverted nipple, no mass, no nipple discharge, no skin change and no tenderness. Breasts are symmetrical.  Abdominal: Soft. Normal appearance, normal aorta and bowel sounds are normal. She exhibits no pulsatile midline mass and no mass. There is no hepatosplenomegaly. There is no tenderness. There is no rigidity, no rebound and no guarding. No hernia. Hernia confirmed negative in the right inguinal area and confirmed negative in the left inguinal area.  Genitourinary:    No labial fusion. There is no rash, tenderness, lesion or injury on the right labia. There is no rash, tenderness, lesion or injury on the left labia. Uterus is not deviated, not enlarged, not fixed and not tender. Cervix exhibits no motion tenderness, no discharge and no friability. Right adnexum displays no mass, no tenderness and no fullness. Left adnexum displays no mass, no tenderness and no fullness. There is tenderness in the vagina. No bleeding in the vagina. No foreign body around the vagina. No signs of injury around the vagina. No vaginal discharge found.  Pt declined rectal exam; chaperone present; pap taken  Musculoskeletal: She exhibits edema.  Lymphadenopathy:       Head (right side): No posterior auricular adenopathy present.       Head (left side): No posterior auricular adenopathy present.    She has no cervical adenopathy.       Right: No supraclavicular adenopathy present.       Left: No supraclavicular adenopathy present.  Neurological: She is alert. She has  normal strength and normal reflexes. No cranial nerve deficit. Gait normal.  Skin: Skin is warm, dry and intact. No rash noted. Nails show no clubbing.  Psychiatric: She has a normal mood and affect. Her speech is normal and behavior is normal. Cognition and memory are  normal.    Labs reviewed: Basic Metabolic Panel:  Recent Labs  16/10/96 2007  03/08/15 2046  03/11/15 0300  03/13/15 0610 03/14/15 0515 03/15/15 0555  03/16/15 1032 03/27/15 1040 06/29/15 1109  NA  --   < > 139  < > 141  < > 141 141 140  < > 134* 145* 143  K  --   < > 4.2  < > 3.3*  < > 4.3 5.0 5.6*  < > 5.3* 3.8 3.6  CL  --   < > 108  < > 115*  < > 116* 112* 113*  < > 104 106 103  CO2  --   < > 24  < > 19*  < > < > GLUCOSE  --   < > 103*  < > 106*  < > 99 80 94  < > 137* 122* 111*  BUN  --   < > 9  < > <5*  < > <5* 5* 6  < > CREATININE  --   < > 1.27*  < > 0.76  < > 0.71 0.90 0.90  < > 0.95 1.00 1.11*  CALCIUM  --   < > 14.4*  < > 10.7*  < > 10.3 10.6* 12.0*  < > 11.3* 9.0 9.3  MG 1.0*  < > 0.8*  < > 1.2*  --  1.1* 1.5* 1.6*  --   --   --   --   PHOS 2.7  --  2.7  --  2.2*  --   --   --   --   --   --   --   --   TSH  --   --  3.106  --   --   --   --   --   --   --   --   --   --   < > = values in this interval not displayed. Liver Function Tests:  Recent Labs  02/26/15 0430 03/08/15 2046 03/27/15 1040  AST ALT 18 21 39*  ALKPHOS 45 48 72  BILITOT 0.4 0.6 0.2  PROT 5.9* 6.7 6.2  ALBUMIN 3.1* 4.0 3.9   No results for input(s): LIPASE, AMYLASE in the last 8760 hours. No results for input(s): AMMONIA in the last 8760 hours. CBC:  Recent Labs  02/23/15 1254  03/08/15 2046 03/11/15 0300 03/15/15 0555 06/28/15 0944  WBC 7.0  < > 6.9 6.2 6.6 8.9  NEUTROABS 4.0  --  4.0  --   --  5.0  HGB 13.2  < > 11.9* 10.3* 11.4*  --   HCT 40.9  < > 37.0 31.6* 34.3* 35.2  MCV 92.7  < > 92.3 91.1 90.7 91  PLT 201  < > 172 157 PLATELET CLUMPS NOTED ON SMEAR, COUNT APPEARS DECREASED 133*  < > = values in this interval not displayed. Lipid Panel:  Recent Labs  06/28/15 0944 06/29/15 1109  CHOL CANCELED 146  HDL  --  52  LDLCALC  --  45  TRIG  --  247*  CHOLHDL  --  2.8  Lab Results  Component Value Date   HGBA1C 5.7*  06/28/2015    Procedures: No results found. ECG OBTAINED AND REVIEWED BY MYSELF: NSR @ 72 bpm, nml axis, Tinverted v1-v6, poor r wave progression. No acute ischemic changes. No change when compared to previous ECGs  Assessment/Plan   ICD-9-CM ICD-10-CM   1. Well adult exam V70.0 Z00.00   2. Essential hypertension 401.9 I10 EKG 12-Lead  3. Cervical cancer screening V76.2 Z12.4 PAP, Image Guided [[facility], [facility]]  4. Schizophrenia, unspecified type (HCC)  F20.9   5. Hyperlipidemia 272.4 E78.5   6. Osteoporosis 733.00 M81.0   7. Prediabetes 790.29 R73.03   8. History of parathyroid surgery V45.89 Z98.890   9. Need for prophylactic vaccination against Streptococcus pneumoniae (pneumococcus) V03.82 Z23 Pneumococcal polysaccharide vaccine 23-valent greater than or equal to 2yo subcutaneous/IM    Pt is UTD on health maintenance. Vaccinations are UTD. Pt maintains a healthy lifestyle. Encouraged pt to exercise 30-45 minutes 4-5 times per week. Eat a well balanced diet. Avoid smoking. Limit alcohol intake. Wear seatbelt when riding in the car. Wear sun block (SPF >50) when spending extended times outside.  Continue current medications as ordered  She failed clock drawing test. May need t/c cognitive impairment - mild dx  Pneumovax vaccine given today  Will call with pap results  Follow up with specialists as scheduled  Get lab results from Dr Tedra Senegal office  Follow up with Shanda Bumps in 3 mos for routine visit  Letisha Yera S. Ancil Linsey  [facility] [address] [place], [place] [address] [phone] Cell (Monday-Friday 8 AM - 5 PM) [phone] After 5 PM and follow prompts

## 2016-01-02 NOTE — Patient Instructions (Signed)
Encouraged her to exercise 30-45 minutes 4-5 times per week. Eat a well balanced diet. Avoid smoking. Limit alcohol intake. Wear seatbelt when riding in the car. Wear sun block (SPF >50) when spending extended times outside.  Continue current medications as ordered  Pneumovax vaccine given today  Will call with pap results  Follow up with specialists as scheduled  Get lab results from Dr Tedra SenegalBland's office  Follow up with Shanda BumpsJessica in 3 mos for routine visit

## 2016-01-05 LAB — PAP IG (IMAGE GUIDED): PAP Smear Comment: 0

## 2016-04-03 ENCOUNTER — Other Ambulatory Visit: Payer: Self-pay | Admitting: Nurse Practitioner

## 2016-04-03 ENCOUNTER — Ambulatory Visit (INDEPENDENT_AMBULATORY_CARE_PROVIDER_SITE_OTHER): Payer: Medicare Other | Admitting: Nurse Practitioner

## 2016-04-03 ENCOUNTER — Encounter: Payer: Self-pay | Admitting: Nurse Practitioner

## 2016-04-03 VITALS — BP 150/84 | HR 100 | Temp 98.1°F | Resp 22 | Ht 62.0 in | Wt 157.2 lb

## 2016-04-03 DIAGNOSIS — R7303 Prediabetes: Secondary | ICD-10-CM

## 2016-04-03 DIAGNOSIS — F209 Schizophrenia, unspecified: Secondary | ICD-10-CM | POA: Diagnosis not present

## 2016-04-03 DIAGNOSIS — E785 Hyperlipidemia, unspecified: Secondary | ICD-10-CM

## 2016-04-03 DIAGNOSIS — I10 Essential (primary) hypertension: Secondary | ICD-10-CM | POA: Diagnosis not present

## 2016-04-03 DIAGNOSIS — M810 Age-related osteoporosis without current pathological fracture: Secondary | ICD-10-CM | POA: Diagnosis not present

## 2016-04-03 LAB — CBC WITH DIFFERENTIAL/PLATELET
BASOS PCT: 0 %
Basophils Absolute: 0 cells/uL (ref 0–200)
EOS PCT: 0 %
Eosinophils Absolute: 0 cells/uL — ABNORMAL LOW (ref 15–500)
HEMATOCRIT: 38.8 % (ref 35.0–45.0)
Hemoglobin: 13.1 g/dL (ref 11.7–15.5)
Lymphocytes Relative: 29 %
Lymphs Abs: 2639 cells/uL (ref 850–3900)
MCH: 30.8 pg (ref 27.0–33.0)
MCHC: 33.8 g/dL (ref 32.0–36.0)
MCV: 91.1 fL (ref 80.0–100.0)
MONOS PCT: 10 %
MPV: 10.1 fL (ref 7.5–12.5)
Monocytes Absolute: 910 cells/uL (ref 200–950)
NEUTROS PCT: 61 %
Neutro Abs: 5551 cells/uL (ref 1500–7800)
PLATELETS: 229 10*3/uL (ref 140–400)
RBC: 4.26 MIL/uL (ref 3.80–5.10)
RDW: 14 % (ref 11.0–15.0)
WBC: 9.1 10*3/uL (ref 3.8–10.8)

## 2016-04-03 NOTE — Progress Notes (Signed)
Patient ID: [name], female   DOB: March 08, 1951, 65 y.o.   MRN: [id number]    PCP: Sharon SellerEUBANKS, Mairely Foxworth K, NP  Advanced Directive information Does patient have an advance directive?: No, Would patient like information on creating an advanced directive?: No - patient declined information  No Known Allergies  Chief Complaint  Patient presents with  . Medical Management of Chronic Issues    3 month routine follow up    HPI: Patient is a 65 y.o. female seen in the office today for routine follow up on chronic conditions. Pt with hx of hyperparathyroidism s/p parathyroidectomy, htn, vit d def, osteopenia, hyperlipidemia Increased stress, brother has been sick, she is worried about him. Feels like this is why her blood pressure is high Otherwise, doing well since last visit. No major issues. conts to smoke a little bit. No cough or congestion. no shortness of breath Good appetite  Still following at [facility], mood has been stable.  Ophthalmology following her glaucoma conts on fosamax for osteoporosis  Attempting heart healthy low sugar diet   Review of Systems:  Review of Systems  Constitutional: Negative for fever, chills, activity change, appetite change, fatigue and unexpected weight change.  Eyes: Negative for visual disturbance.  Respiratory: Negative for cough, chest tightness and shortness of breath.   Cardiovascular: Negative for chest pain, palpitations and leg swelling.  Gastrointestinal: Negative for nausea, abdominal pain, diarrhea, constipation and abdominal distention.  Genitourinary: Negative for difficulty urinating.  Skin: Negative for pallor and rash.  Neurological: Negative for dizziness, weakness, light-headedness and headaches.  Psychiatric/Behavioral: Negative for suicidal ideas and sleep disturbance.    Past Medical History  Diagnosis Date  . Hyperparathyroidism   . Hypertension   . Osteopenia   . Vitamin D deficiency   . Cough   . Other abnormal blood  chemistry   . Chlamydia trachomatis infection of unspecified genitourinary site 12/18/2011  . Disorder of bone and cartilage, unspecified   . Edema   . Tobacco use disorder   . Tachycardia, unspecified 05/29/2010  . Unspecified glaucoma   . Hypercalcemia 02/14/2015  . Hyperlipemia   . Schizophrenia (HCC)     "little bit" (02/23/2015)   Past Surgical History  Procedure Laterality Date  . No past surgeries    . Parathyroidectomy N/A 03/15/2015    Procedure: PARATHYROIDECTOMY AND NECK EXPLORATION;  Surgeon: Darnell Levelodd Gerkin, MD;  Location: [facility] OR;  Service: General;  Laterality: N/A;   Social History:   reports that she has quit smoking. Her smoking use included Cigarettes. She has a 5.64 pack-year smoking history. She has never used smokeless tobacco. She reports that she drinks alcohol. She reports that she does not use illicit drugs.  Family History  Problem Relation Age of Onset  . Hypertension Other   . Diabetes Other   . Cancer Mother   . Cancer Brother     Throat    Medications: Patient's Medications  New Prescriptions   No medications on file  Previous Medications   ALENDRONATE (FOSAMAX) 70 MG TABLET    TAKE 1 TABLET ONCE WEEKLY TO TREAT OSTEOPOROSIS.TAKE WITH A FULL GLASS OF WATER ON AN EMPTY STOMACH.   ATENOLOL (TENORMIN) 50 MG TABLET    TAKE 1 TABLET BY MOUTH EVERY DAY   ATORVASTATIN (LIPITOR) 20 MG TABLET    TAKE 1 TABLET (20 MG TOTAL) BY MOUTH DAILY.   CHOLECALCIFEROL (VITAMIN D3) 10000 UNITS TABS    Take by mouth. Take 2 tablets by mouth once a day  DORZOLAMIDE (TRUSOPT) 2 % OPHTHALMIC SOLUTION    Place 1 drop into both eyes 3 (three) times daily.   LUMIGAN 0.01 % SOLN    Place 1 drop into both eyes at bedtime.   MAGNESIUM GLUCONATE (MAGONATE) 500 MG TABLET    Take 1 tablet (500 mg total) by mouth daily.   THIORIDAZINE (MELLARIL) 50 MG TABLET    TAKE 1 OR 2 TABLETS BY MOUTH AT BEDTIME  Modified Medications   No medications on file  Discontinued Medications    LATANOPROST (XALATAN) 0.005 % OPHTHALMIC SOLUTION    Place 1 drop into both eyes at bedtime.     Physical Exam:  Filed Vitals:   04/03/16 1354  BP: 150/84  Pulse: 100  Temp: 98.1 F (36.7 C)  TempSrc: Oral  Resp: 22  Height:  (1.575 m)  Weight: 157 lb 3.2 oz (71.305 kg)  SpO2: 96%   Body mass index is 28.74 kg/(m^2).  Physical Exam  Constitutional: She is oriented to person, place, and time. No distress.  HENT:  Mouth/Throat: Oropharynx is clear and moist.  Neck: Normal range of motion. Neck supple.  Cardiovascular: Normal rate, regular rhythm and normal heart sounds.   Pulmonary/Chest: Effort normal and breath sounds normal.  Abdominal: Soft. Bowel sounds are normal.  Musculoskeletal: She exhibits no edema or tenderness.  Neurological: She is alert and oriented to person, place, and time.  Skin: Skin is warm and dry. She is not diaphoretic.  Psychiatric: She has a normal mood and affect.    Labs reviewed: Basic Metabolic Panel:  Recent Labs  40/98/11 1109  NA 143  K 3.6  CL 103  CO2 22  GLUCOSE 111*  BUN 8  CREATININE 1.11*  CALCIUM 9.3   Liver Function Tests: No results for input(s): AST, ALT, ALKPHOS, BILITOT, PROT, ALBUMIN in the last 8760 hours. No results for input(s): LIPASE, AMYLASE in the last 8760 hours. No results for input(s): AMMONIA in the last 8760 hours. CBC:  Recent Labs  06/28/15 0944  WBC 8.9  NEUTROABS 5.0  HCT 35.2  MCV 91  PLT 133*   Lipid Panel:  Recent Labs  06/28/15 0944 06/29/15 1109  CHOL CANCELED 146  HDL  --  52  LDLCALC  --  45  TRIG  --  247*  CHOLHDL  --  2.8   TSH: No results for input(s): TSH in the last 8760 hours. A1C: Lab Results  Component Value Date   HGBA1C 5.7* 06/28/2015     Assessment/Plan 1. Essential hypertension Cont medications as prescribed, low sodium heart healthy diet -to take blood pressure at home and bring record to next visit - CBC with Differential/Platelets  2.  Schizophrenia, unspecified type (HCC) -mood has been stable, conts to follow up with psych. -cont on thioridazine  3. Hyperlipidemia -stable on lipitor - COMPLETE METABOLIC PANEL WITH GFR  4. Osteoporosis -conts on fosamax, last dxa aug 2015  5. Prediabetes Discussed diet modification and increase in activity. - will follow up Hemoglobin A1c   Follow up in 6 weeks for blood pressure check China Deitrick K. Biagio Borg  [facility] [phone] 8 am - 5 pm) [phone] (after hours)

## 2016-04-03 NOTE — Patient Instructions (Signed)
Record blood pressure at home once weekly after you have taken medication Follow up in 6 weeks for blood pressure  Bring your blood pressure log with you to next visit

## 2016-04-04 LAB — COMPLETE METABOLIC PANEL WITH GFR
ALT: 33 U/L — AB (ref 6–29)
AST: 35 U/L (ref 10–35)
Albumin: 4.3 g/dL (ref 3.6–5.1)
Alkaline Phosphatase: 54 U/L (ref 33–130)
BUN: 8 mg/dL (ref 7–25)
CHLORIDE: 107 mmol/L (ref 98–110)
CO2: 22 mmol/L (ref 20–31)
CREATININE: 1.08 mg/dL — AB (ref 0.50–0.99)
Calcium: 9.8 mg/dL (ref 8.6–10.4)
GFR, Est African American: 63 mL/min (ref >=60)
GFR, Est Non African American: 55 mL/min — ABNORMAL LOW (ref >=60)
Glucose, Bld: 147 mg/dL — ABNORMAL HIGH (ref 65–99)
POTASSIUM: 3.5 mmol/L (ref 3.5–5.3)
Sodium: 144 mmol/L (ref 135–146)
Total Bilirubin: 0.4 mg/dL (ref 0.2–1.2)
Total Protein: 6.8 g/dL (ref 6.1–8.1)

## 2016-04-07 LAB — HEMOGLOBIN A1C
Hgb A1c MFr Bld: 6.1 % — ABNORMAL HIGH (ref <5.7)
Mean Plasma Glucose: 128 mg/dL

## 2016-05-12 ENCOUNTER — Other Ambulatory Visit: Payer: Self-pay | Admitting: Nurse Practitioner

## 2016-05-15 ENCOUNTER — Ambulatory Visit (INDEPENDENT_AMBULATORY_CARE_PROVIDER_SITE_OTHER): Payer: Medicare Other | Admitting: Nurse Practitioner

## 2016-05-15 ENCOUNTER — Other Ambulatory Visit: Payer: Self-pay | Admitting: Nurse Practitioner

## 2016-05-15 ENCOUNTER — Encounter: Payer: Self-pay | Admitting: Nurse Practitioner

## 2016-05-15 VITALS — BP 140/76 | HR 84 | Temp 98.7°F | Ht 62.0 in | Wt 160.4 lb

## 2016-05-15 DIAGNOSIS — R7303 Prediabetes: Secondary | ICD-10-CM | POA: Diagnosis not present

## 2016-05-15 DIAGNOSIS — I10 Essential (primary) hypertension: Secondary | ICD-10-CM | POA: Diagnosis not present

## 2016-05-15 DIAGNOSIS — Z23 Encounter for immunization: Secondary | ICD-10-CM

## 2016-05-15 DIAGNOSIS — Z720 Tobacco use: Secondary | ICD-10-CM | POA: Diagnosis not present

## 2016-05-15 DIAGNOSIS — F17200 Nicotine dependence, unspecified, uncomplicated: Secondary | ICD-10-CM | POA: Insufficient documentation

## 2016-05-15 DIAGNOSIS — E785 Hyperlipidemia, unspecified: Secondary | ICD-10-CM | POA: Diagnosis not present

## 2016-05-15 LAB — BASIC METABOLIC PANEL
BUN: 9 mg/dL (ref 7–25)
CALCIUM: 9.2 mg/dL (ref 8.6–10.4)
CO2: 22 mmol/L (ref 20–31)
CREATININE: 0.92 mg/dL (ref 0.50–0.99)
Chloride: 109 mmol/L (ref 98–110)
Glucose, Bld: 120 mg/dL — ABNORMAL HIGH (ref 65–99)
Potassium: 3.4 mmol/L — ABNORMAL LOW (ref 3.5–5.3)
Sodium: 146 mmol/L (ref 135–146)

## 2016-05-15 NOTE — Patient Instructions (Signed)
To watch intake of sugar Avoid candy, ice cream, cake, cookies etc Drink more water and less soda  Will check cholesterol today  Stop smoking  It is important for you to manage your blood sugar (glucose) level. Your blood glucose level can be greatly affected by what you eat. Eating healthier foods in the appropriate amounts throughout the day at about the same time each day will help you control your blood glucose level. It can also help slow or prevent worsening of your diabetes mellitus. Healthy eating may even help you improve the level of your blood pressure and reach or maintain a healthy weight.  General recommendations for healthful eating and cooking habits include:  Eating meals and snacks regularly. Avoid going long periods of time without eating to lose weight.  Eating a diet that consists mainly of plant-based foods, such as fruits, vegetables, nuts, legumes, and whole grains.  Using low-heat cooking methods, such as baking, instead of high-heat cooking methods, such as deep frying. Work with your dietitian to make sure you understand how to use the Nutrition Facts information on food labels. HOW CAN FOOD AFFECT ME? Carbohydrates Carbohydrates affect your blood glucose level more than any other type of food. Your dietitian will help you determine how many carbohydrates to eat at each meal and teach you how to count carbohydrates. Counting carbohydrates is important to keep your blood glucose at a healthy level, especially if you are using insulin or taking certain medicines for diabetes mellitus. Alcohol Alcohol can cause sudden decreases in blood glucose (hypoglycemia), especially if you use insulin or take certain medicines for diabetes mellitus. Hypoglycemia can be a life-threatening condition. Symptoms of hypoglycemia (sleepiness, dizziness, and disorientation) are similar to symptoms of having too much alcohol.  If your health care provider has given you approval to drink  alcohol, do so in moderation and use the following guidelines:  Women should not have more than one drink per day, and men should not have more than two drinks per day. One drink is equal to:  12 oz of beer.  5 oz of wine.  1 oz of hard liquor.  Do not drink on an empty stomach.  Keep yourself hydrated. Have water, diet soda, or unsweetened iced tea.  Regular soda, juice, and other mixers might contain a lot of carbohydrates and should be counted. WHAT FOODS ARE NOT RECOMMENDED? As you make food choices, it is important to remember that all foods are not the same. Some foods have fewer nutrients per serving than other foods, even though they might have the same number of calories or carbohydrates. It is difficult to get your body what it needs when you eat foods with fewer nutrients. Examples of foods that you should avoid that are high in calories and carbohydrates but low in nutrients include:  Trans fats (most processed foods list trans fats on the Nutrition Facts label).  Regular soda.  Juice.  Candy.  Sweets, such as cake, pie, doughnuts, and cookies.  Fried foods. WHAT FOODS CAN I EAT? Eat nutrient-rich foods, which will nourish your body and keep you healthy. The food you should eat also will depend on several factors, including:  The calories you need.  The medicines you take.  Your weight.  Your blood glucose level.  Your blood pressure level.  Your cholesterol level. You should eat a variety of foods, including:  Protein.  Lean cuts of meat.  Proteins low in saturated fats, such as fish, egg whites, and beans.  Avoid processed meats.  Fruits and vegetables.  Fruits and vegetables that may help control blood glucose levels, such as apples, mangoes, and yams.  Dairy products.  Choose fat-free or low-fat dairy products, such as milk, yogurt, and cheese.  Grains, bread, pasta, and rice.  Choose whole grain products, such as multigrain bread, whole  oats, and brown rice. These foods may help control blood pressure.  Fats.  Foods containing healthful fats, such as nuts, avocado, olive oil, canola oil, and fish. DOES EVERYONE WITH DIABETES MELLITUS HAVE THE SAME MEAL PLAN? Because every person with diabetes mellitus is different, there is not one meal plan that works for everyone. It is very important that you meet with a dietitian who will help you create a meal plan that is just right for you.   This information is not intended to replace advice given to you by your health care provider. Make sure you discuss any questions you have with your health care provider.   Document Released: 06/05/2005 Document Revised: 09/29/2014 Document Reviewed: 08/05/2013 Elsevier Interactive Patient Education [employer]2016 Elsevier Inc.

## 2016-05-15 NOTE — Progress Notes (Signed)
Careteam: Patient Care Team: [name], NP as PCP - General (Nurse Practitioner)  Advanced Directive information Does patient have an advance directive?: No  No Known Allergies  Chief Complaint  Patient presents with  . Follow-up    6 week f/u for blood pressure     HPI: Patient is a 65 y.o. female seen in the office today to follow up blood pressure. Pt with elevated blood pressure at last visit but contributed it to stress. Took blood pressure at home and readings ranging from 118-162/70-104. Admits she had not taken her medication before she took her blood pressure some days.  No headache, blurred visit, chest pains or shortness of breath.  Pt conts to smoke but has been trying to cut back Smoked before she came to [facility].  Quit for 8 years at one time. Started smoking when she was 21. Had stopped and then started back a few years ago.  Does not even smoke a pack a week- will go a while and wont smoke at all.  Husband smokes. Eating a lot of sweets- loves ice cream and candy and drinking a lot of soda  Review of Systems:  Review of Systems  Constitutional: Negative for activity change, appetite change, chills, fatigue, fever and unexpected weight change.  Eyes: Negative for visual disturbance.  Respiratory: Negative for cough, chest tightness and shortness of breath.   Cardiovascular: Negative for chest pain, palpitations and leg swelling.  Gastrointestinal: Negative for abdominal distention, abdominal pain, constipation, diarrhea and nausea.  Genitourinary: Negative for difficulty urinating.  Skin: Negative for pallor and rash.  Neurological: Negative for dizziness, weakness, light-headedness and headaches.  Psychiatric/Behavioral: Negative for sleep disturbance and suicidal ideas.    Past Medical History:  Diagnosis Date  . Chlamydia trachomatis infection of unspecified genitourinary site 12/18/2011  . Cough   . Disorder of bone and cartilage, unspecified   .  Edema   . Hypercalcemia 02/14/2015  . Hyperlipemia   . Hyperparathyroidism   . Hypertension   . Osteopenia   . Other abnormal blood chemistry   . Schizophrenia (HCC)    "little bit" (02/23/2015)  . Tachycardia, unspecified 05/29/2010  . Tobacco use disorder   . Unspecified glaucoma   . Vitamin D deficiency    Past Surgical History:  Procedure Laterality Date  . NO PAST SURGERIES    . PARATHYROIDECTOMY N/A 03/15/2015   Procedure: PARATHYROIDECTOMY AND NECK EXPLORATION;  Surgeon: Darnell Level, MD;  Location: [facility] OR;  Service: General;  Laterality: N/A;   Social History:   reports that she has quit smoking. Her smoking use included Cigarettes. She has a 5.64 pack-year smoking history. She has never used smokeless tobacco. She reports that she drinks alcohol. She reports that she does not use drugs.  Family History  Problem Relation Age of Onset  . Cancer Mother   . Cancer Brother     Throat  . Hypertension Other   . Diabetes Other     Medications: Patient's Medications  New Prescriptions   No medications on file  Previous Medications   ALENDRONATE (FOSAMAX) 70 MG TABLET    TAKE 1 TABLET ONCE WEEKLY TO TREAT OSTEOPOROSIS.TAKE WITH A FULL GLASS OF WATER ON AN EMPTY STOMACH.   ATENOLOL (TENORMIN) 50 MG TABLET    TAKE 1 TABLET BY MOUTH EVERY DAY   ATORVASTATIN (LIPITOR) 20 MG TABLET    TAKE 1 TABLET (20 MG TOTAL) BY MOUTH DAILY.   CHOLECALCIFEROL (VITAMIN D3) 10000 UNITS TABS  Take by mouth. Take 2 tablets by mouth once a day   DORZOLAMIDE (TRUSOPT) 2 % OPHTHALMIC SOLUTION    Place 1 drop into both eyes 3 (three) times daily.   LUMIGAN 0.01 % SOLN    Place 1 drop into both eyes at bedtime.   MAGNESIUM GLUCONATE (MAGONATE) 500 MG TABLET    Take 1 tablet (500 mg total) by mouth daily.   THIORIDAZINE (MELLARIL) 50 MG TABLET    TAKE 1 OR 2 TABLETS BY MOUTH AT BEDTIME  Modified Medications   No medications on file  Discontinued Medications   No medications on file     Physical  Exam:  Vitals:   05/15/16 1005  BP: 140/76  Pulse: 84  Temp: 98.7 F (37.1 C)  TempSrc: Oral  SpO2: 98%  Weight: 160 lb 6 oz (72.7 kg)  Height: 5\' 2"  (1.575 m)   Body mass index is 29.33 kg/m.  Physical Exam  Constitutional: She is oriented to person, place, and time. No distress.  HENT:  Mouth/Throat: Oropharynx is clear and moist.  Neck: Normal range of motion. Neck supple.  Cardiovascular: Normal rate, regular rhythm and normal heart sounds.   Pulmonary/Chest: Effort normal and breath sounds normal.  Abdominal: Soft. Bowel sounds are normal.  Musculoskeletal: She exhibits no edema or tenderness.  Neurological: She is alert and oriented to person, place, and time.  Skin: Skin is warm and dry. She is not diaphoretic.  Psychiatric: She has a normal mood and affect.    Labs reviewed: Basic Metabolic Panel:  Recent Labs  16/06/9609/07/16 1109 04/03/16 1501  NA 143 144  K 3.6 3.5  CL 103 107  CO2 22 22  GLUCOSE 111* 147*  BUN 8 8  CREATININE 1.11* 1.08*  CALCIUM 9.3 9.8   Liver Function Tests:  Recent Labs  04/03/16 1501  AST 35  ALT 33*  ALKPHOS 54  BILITOT 0.4  PROT 6.8  ALBUMIN 4.3   No results for input(s): LIPASE, AMYLASE in the last 8760 hours. No results for input(s): AMMONIA in the last 8760 hours. CBC:  Recent Labs  06/28/15 0944 04/03/16 1501  WBC 8.9 9.1  NEUTROABS 5.0 5,551  HGB  --  13.1  HCT 35.2 38.8  MCV 91 91.1  PLT 133* 229   Lipid Panel:  Recent Labs  06/28/15 0944 06/29/15 1109  CHOL CANCELED 146  HDL  --  52  LDLCALC  --  45  TRIG  --  247*  CHOLHDL  --  2.8   TSH: No results for input(s): TSH in the last 8760 hours. A1C: Lab Results  Component Value Date   HGBA1C 6.1 (H) 04/03/2016     Assessment/Plan 1. Essential hypertension -blood pressures variable but overall better control at home then in office. Will cont current medication and encouraged lifestyle modifications, low sodium diet and smoking cessation -  Basic metabolic panel; Future  2. Tobacco abuse Recently started back smoking, has smoked on and off for many years. Encouraged smoking cessation.   3. Prediabetes -pt eating a lot of foods high in sugar. Discussed in length about dietary changes.  -will follow up hgb a1c prior to next visit  - Hemoglobin A1c; Future  4. Hyperlipidemia -cont on lipitor to cont medication and lifestyle modifications.  - Lipid panel  Kimmy Parish K. Biagio BorgEubanks, AGNP  [facility] [phone](Monday-Friday 8 am - 5 pm) [phone] (after hours)

## 2016-05-16 ENCOUNTER — Telehealth: Payer: Self-pay | Admitting: *Deleted

## 2016-05-16 LAB — HEMOGLOBIN A1C
Hgb A1c MFr Bld: 5.8 % — ABNORMAL HIGH
Mean Plasma Glucose: 120 mg/dL

## 2016-05-16 NOTE — Telephone Encounter (Signed)
-----   Message from Sharon SellerJessica K Eubanks, NP sent at 05/16/2016  3:45 PM EDT ----- Regarding: labs Pt was supposed to get LIPID panel yesterday and A1c and BMP before next appt. Unsure why this was not drawn correctly, can we please see if the lipid panel can be added on

## 2016-05-16 NOTE — Telephone Encounter (Signed)
Lipid Panel Added.

## 2016-05-17 LAB — LIPID PANEL
Cholesterol: 111 mg/dL — ABNORMAL LOW (ref 125–200)
HDL: 56 mg/dL (ref >=46)
LDL CALC: 19 mg/dL (ref <130)
TRIGLYCERIDES: 180 mg/dL — AB (ref <150)
Total CHOL/HDL Ratio: 2 Ratio (ref <=5.0)
VLDL: 36 mg/dL — AB (ref <30)

## 2016-05-27 ENCOUNTER — Other Ambulatory Visit: Payer: Self-pay

## 2016-05-27 DIAGNOSIS — E785 Hyperlipidemia, unspecified: Secondary | ICD-10-CM

## 2016-05-27 DIAGNOSIS — R7303 Prediabetes: Secondary | ICD-10-CM

## 2016-05-28 ENCOUNTER — Other Ambulatory Visit: Payer: Self-pay | Admitting: Nurse Practitioner

## 2016-07-02 ENCOUNTER — Other Ambulatory Visit: Payer: Self-pay | Admitting: Nurse Practitioner

## 2016-07-02 DIAGNOSIS — Z1231 Encounter for screening mammogram for malignant neoplasm of breast: Secondary | ICD-10-CM

## 2016-07-11 ENCOUNTER — Other Ambulatory Visit: Payer: Medicare Other

## 2016-07-11 DIAGNOSIS — R7303 Prediabetes: Secondary | ICD-10-CM

## 2016-07-11 DIAGNOSIS — E785 Hyperlipidemia, unspecified: Secondary | ICD-10-CM | POA: Diagnosis not present

## 2016-07-11 LAB — COMPLETE METABOLIC PANEL WITH GFR
ALBUMIN: 4.3 g/dL (ref 3.6–5.1)
ALK PHOS: 55 U/L (ref 33–130)
ALT: 26 U/L (ref 6–29)
AST: 36 U/L — AB (ref 10–35)
BUN: 7 mg/dL (ref 7–25)
CALCIUM: 9 mg/dL (ref 8.6–10.4)
CHLORIDE: 105 mmol/L (ref 98–110)
CO2: 25 mmol/L (ref 20–31)
Creat: 0.93 mg/dL (ref 0.50–0.99)
GFR, EST AFRICAN AMERICAN: 76 mL/min (ref >=60)
GFR, EST NON AFRICAN AMERICAN: 66 mL/min (ref >=60)
Glucose, Bld: 106 mg/dL — ABNORMAL HIGH (ref 65–99)
POTASSIUM: 3.6 mmol/L (ref 3.5–5.3)
Sodium: 142 mmol/L (ref 135–146)
Total Bilirubin: 0.5 mg/dL (ref 0.2–1.2)
Total Protein: 7 g/dL (ref 6.1–8.1)

## 2016-07-11 LAB — HEMOGLOBIN A1C
HEMOGLOBIN A1C: 5.3 % (ref <5.7)
MEAN PLASMA GLUCOSE: 105 mg/dL

## 2016-07-15 ENCOUNTER — Ambulatory Visit (INDEPENDENT_AMBULATORY_CARE_PROVIDER_SITE_OTHER): Payer: Medicare Other | Admitting: Nurse Practitioner

## 2016-07-15 ENCOUNTER — Other Ambulatory Visit: Payer: Self-pay | Admitting: Nurse Practitioner

## 2016-07-15 ENCOUNTER — Encounter: Payer: Self-pay | Admitting: Nurse Practitioner

## 2016-07-15 VITALS — BP 134/82 | HR 93 | Temp 98.2°F | Resp 18 | Ht 62.0 in | Wt 157.4 lb

## 2016-07-15 DIAGNOSIS — Z720 Tobacco use: Secondary | ICD-10-CM | POA: Diagnosis not present

## 2016-07-15 DIAGNOSIS — I10 Essential (primary) hypertension: Secondary | ICD-10-CM | POA: Diagnosis not present

## 2016-07-15 DIAGNOSIS — R739 Hyperglycemia, unspecified: Secondary | ICD-10-CM

## 2016-07-15 DIAGNOSIS — M810 Age-related osteoporosis without current pathological fracture: Secondary | ICD-10-CM

## 2016-07-15 DIAGNOSIS — E2839 Other primary ovarian failure: Secondary | ICD-10-CM

## 2016-07-15 NOTE — Progress Notes (Signed)
Careteam: Patient Care Team: [name], NP as PCP - General (Nurse Practitioner)  Advanced Directive information Does patient have an advance directive?: No, Would patient like information on creating an advanced directive?: Yes - Educational materials given (Pt has not completed paperwork yet. )  No Known Allergies  Chief Complaint  Patient presents with  . Medical Management of Chronic Issues    2 month follow up on BP. Labs printed.      HPI: Patient is a 65 y.o. female seen in the office today for blood pressure check and lab review.  Doing well. conts to smoke occasionally and drinking ETOH at times.  Taking blood pressure at home prior to taking medication Did not bring blood pressure record to office  No headache, shortness of breath, chest pains.    Review of Systems:  Review of Systems  Constitutional: Negative for activity change, appetite change, chills, fatigue, fever and unexpected weight change.  Eyes: Negative for visual disturbance.  Respiratory: Negative for cough, chest tightness and shortness of breath.   Cardiovascular: Negative for chest pain, palpitations and leg swelling.  Gastrointestinal: Negative for abdominal distention, abdominal pain, constipation, diarrhea and nausea.  Genitourinary: Negative for difficulty urinating.  Skin: Negative for pallor and rash.  Neurological: Negative for dizziness, weakness, light-headedness and headaches.  Psychiatric/Behavioral: Negative for sleep disturbance and suicidal ideas.    Past Medical History:  Diagnosis Date  . Chlamydia trachomatis infection of unspecified genitourinary site 12/18/2011  . Cough   . Disorder of bone and cartilage, unspecified   . Edema   . Hypercalcemia 02/14/2015  . Hyperlipemia   . Hyperparathyroidism   . Hypertension   . Osteopenia   . Other abnormal blood chemistry   . Schizophrenia (HCC)    "little bit" (02/23/2015)  . Tachycardia, unspecified 05/29/2010  .  Tobacco use disorder   . Unspecified glaucoma(365.9)   . Vitamin D deficiency    Past Surgical History:  Procedure Laterality Date  . NO PAST SURGERIES    . PARATHYROIDECTOMY N/A 03/15/2015   Procedure: PARATHYROIDECTOMY AND NECK EXPLORATION;  Surgeon: Darnell Level, MD;  Location: [facility] OR;  Service: General;  Laterality: N/A;   Social History:   reports that she has quit smoking. Her smoking use included Cigarettes. She has a 5.64 pack-year smoking history. She has never used smokeless tobacco. She reports that she drinks alcohol. She reports that she does not use drugs.  Family History  Problem Relation Age of Onset  . Cancer Mother   . Cancer Brother     Throat  . Hypertension Other   . Diabetes Other     Medications: Patient's Medications  New Prescriptions   No medications on file  Previous Medications   ALENDRONATE (FOSAMAX) 70 MG TABLET    TAKE 1 TABLET ONCE WEEKLY TO TREAT OSTEOPOROSIS.TAKE WITH A FULL GLASS OF WATER ON AN EMPTY STOMACH.   ATENOLOL (TENORMIN) 50 MG TABLET    TAKE 1 TABLET BY MOUTH EVERY DAY   ATORVASTATIN (LIPITOR) 20 MG TABLET    TAKE 1 TABLET (20 MG TOTAL) BY MOUTH DAILY.   CHOLECALCIFEROL (VITAMIN D3) 10000 UNITS TABS    Take by mouth. Take 2 tablets by mouth once a day   DORZOLAMIDE (TRUSOPT) 2 % OPHTHALMIC SOLUTION    Place 1 drop into both eyes 3 (three) times daily.   LUMIGAN 0.01 % SOLN    Place 1 drop into both eyes at bedtime.   MAGNESIUM GLUCONATE (MAGONATE) 500 MG  TABLET    Take 1 tablet (500 mg total) by mouth daily.   THIORIDAZINE (MELLARIL) 50 MG TABLET    TAKE 1 OR 2 TABLETS BY MOUTH AT BEDTIME  Modified Medications   No medications on file  Discontinued Medications   No medications on file     Physical Exam:  Vitals:   07/15/16 1307  BP: 134/82  Pulse: 93  Resp: 18  Temp: 98.2 F (36.8 C)  TempSrc: Oral  SpO2: 97%  Weight: 157 lb 6.4 oz (71.4 kg)  Height: 5\' 2"  (1.575 m)   Body mass index is 28.79 kg/m.  Physical Exam    Constitutional: She is oriented to person, place, and time. No distress.  Neck: Normal range of motion. Neck supple.  Cardiovascular: Normal rate, regular rhythm and normal heart sounds.   Pulmonary/Chest: Effort normal and breath sounds normal.  Abdominal: Soft. Bowel sounds are normal.  Musculoskeletal: She exhibits no edema.  Neurological: She is alert and oriented to person, place, and time.  Skin: Skin is warm and dry. She is not diaphoretic.  Psychiatric: She has a normal mood and affect.    Labs reviewed: Basic Metabolic Panel:  Recent Labs  16/06/9606/13/17 1501 05/15/16 1111 07/11/16 1052  NA 144 146 142  K 3.5 3.4* 3.6  CL 107 109 105  CO2 22 22 25   GLUCOSE 147* 120* 106*  BUN 8 9 7   CREATININE 1.08* 0.92 0.93  CALCIUM 9.8 9.2 9.0   Liver Function Tests:  Recent Labs  04/03/16 1501 07/11/16 1052  AST 35 36*  ALT 33* 26  ALKPHOS 54 55  BILITOT 0.4 0.5  PROT 6.8 7.0  ALBUMIN 4.3 4.3   No results for input(s): LIPASE, AMYLASE in the last 8760 hours. No results for input(s): AMMONIA in the last 8760 hours. CBC:  Recent Labs  04/03/16 1501  WBC 9.1  NEUTROABS 5,551  HGB 13.1  HCT 38.8  MCV 91.1  PLT 229   Lipid Panel:  Recent Labs  05/15/16 1111  CHOL 111*  HDL 56  LDLCALC 19  TRIG 180*  CHOLHDL 2.0   TSH: No results for input(s): TSH in the last 8760 hours. A1C: Lab Results  Component Value Date   HGBA1C 5.3 07/11/2016     Assessment/Plan 1. Essential hypertension Blood pressure stable. Again reinforced diet modification and medication compliance. To take blood pressure after she has taken her medication.   2. Hyperglycemia No evidence of diabetes, A1c at 5.3   3. Tobacco abuse Encouraged smoking cessation.  4. Osteoporosis, unspecified osteoporosis type, unspecified pathological fracture presence -on fosamax, will follow up Bone Density; Future   Aeneas Longsworth K. Biagio BorgEubanks, AGNP  [facility] [phone](Monday-Friday 8 am - 5 pm) [phone] (after hours)

## 2016-07-15 NOTE — Patient Instructions (Addendum)
DASH Eating Plan  DASH stands for "Dietary Approaches to Stop Hypertension." The DASH eating plan is a healthy eating plan that has been shown to reduce high blood pressure (hypertension). Additional health benefits may include reducing the risk of type 2 diabetes mellitus, heart disease, and stroke. The DASH eating plan may also help with weight loss.  WHAT DO I NEED TO KNOW ABOUT THE DASH EATING PLAN?  For the DASH eating plan, you will follow these general guidelines:  · Choose foods with a percent daily value for sodium of less than 5% (as listed on the food label).  · Use salt-free seasonings or herbs instead of table salt or sea salt.  · Check with your health care provider or pharmacist before using salt substitutes.  · Eat lower-sodium products, often labeled as "lower sodium" or "no salt added."  · Eat fresh foods.  · Eat more vegetables, fruits, and low-fat dairy products.  · Choose whole grains. Look for the word "whole" as the first word in the ingredient list.  · Choose fish and skinless chicken or turkey more often than red meat. Limit fish, poultry, and meat to 6 oz (170 g) each day.  · Limit sweets, desserts, sugars, and sugary drinks.  · Choose heart-healthy fats.  · Limit cheese to 1 oz (28 g) per day.  · Eat more home-cooked food and less restaurant, buffet, and fast food.  · Limit fried foods.  · Cook foods using methods other than frying.  · Limit canned vegetables. If you do use them, rinse them well to decrease the sodium.  · When eating at a restaurant, ask that your food be prepared with less salt, or no salt if possible.  WHAT FOODS CAN I EAT?  Seek help from a dietitian for individual calorie needs.  Grains  Whole grain or whole wheat bread. Brown rice. Whole grain or whole wheat pasta. Quinoa, bulgur, and whole grain cereals. Low-sodium cereals. Corn or whole wheat flour tortillas. Whole grain cornbread. Whole grain crackers. Low-sodium crackers.  Vegetables  Fresh or frozen vegetables  (raw, steamed, roasted, or grilled). Low-sodium or reduced-sodium tomato and vegetable juices. Low-sodium or reduced-sodium tomato sauce and paste. Low-sodium or reduced-sodium canned vegetables.   Fruits  All fresh, canned (in natural juice), or frozen fruits.  Meat and Other Protein Products  Ground beef (85% or leaner), grass-fed beef, or beef trimmed of fat. Skinless chicken or turkey. Ground chicken or turkey. Pork trimmed of fat. All fish and seafood. Eggs. Dried beans, peas, or lentils. Unsalted nuts and seeds. Unsalted canned beans.  Dairy  Low-fat dairy products, such as skim or 1% milk, 2% or reduced-fat cheeses, low-fat ricotta or cottage cheese, or plain low-fat yogurt. Low-sodium or reduced-sodium cheeses.  Fats and Oils  Tub margarines without trans fats. Light or reduced-fat mayonnaise and salad dressings (reduced sodium). Avocado. Safflower, olive, or canola oils. Natural peanut or almond butter.  Other  Unsalted popcorn and pretzels.  The items listed above may not be a complete list of recommended foods or beverages. Contact your dietitian for more options.  WHAT FOODS ARE NOT RECOMMENDED?  Grains  White bread. White pasta. White rice. Refined cornbread. Bagels and croissants. Crackers that contain trans fat.  Vegetables  Creamed or fried vegetables. Vegetables in a cheese sauce. Regular canned vegetables. Regular canned tomato sauce and paste. Regular tomato and vegetable juices.  Fruits  Dried fruits. Canned fruit in light or heavy syrup. Fruit juice.  Meat and Other Protein   Products  Fatty cuts of meat. Ribs, chicken wings, bacon, sausage, bologna, salami, chitterlings, fatback, hot dogs, bratwurst, and packaged luncheon meats. Salted nuts and seeds. Canned beans with salt.  Dairy  Whole or 2% milk, cream, half-and-half, and cream cheese. Whole-fat or sweetened yogurt. Full-fat cheeses or blue cheese. Nondairy creamers and whipped toppings. Processed cheese, cheese spreads, or cheese  curds.  Condiments  Onion and garlic salt, seasoned salt, table salt, and sea salt. Canned and packaged gravies. Worcestershire sauce. Tartar sauce. Barbecue sauce. Teriyaki sauce. Soy sauce, including reduced sodium. Steak sauce. Fish sauce. Oyster sauce. Cocktail sauce. Horseradish. Ketchup and mustard. Meat flavorings and tenderizers. Bouillon cubes. Hot sauce. Tabasco sauce. Marinades. Taco seasonings. Relishes.  Fats and Oils  Butter, stick margarine, lard, shortening, ghee, and bacon fat. Coconut, palm kernel, or palm oils. Regular salad dressings.  Other  Pickles and olives. Salted popcorn and pretzels.  The items listed above may not be a complete list of foods and beverages to avoid. Contact your dietitian for more information.  WHERE CAN I FIND MORE INFORMATION?  National Heart, Lung, and Blood Institute: www.nhlbi.nih.gov/health/health-topics/topics/dash/     This information is not intended to replace advice given to you by your health care provider. Make sure you discuss any questions you have with your health care provider.     Document Released: 08/28/2011 Document Revised: 09/29/2014 Document Reviewed: 07/13/2013  Elsevier Interactive Patient Education ©2016 Elsevier Inc.

## 2016-07-17 DIAGNOSIS — H2513 Age-related nuclear cataract, bilateral: Secondary | ICD-10-CM | POA: Diagnosis not present

## 2016-07-17 DIAGNOSIS — H02409 Unspecified ptosis of unspecified eyelid: Secondary | ICD-10-CM | POA: Diagnosis not present

## 2016-07-17 DIAGNOSIS — H401132 Primary open-angle glaucoma, bilateral, moderate stage: Secondary | ICD-10-CM | POA: Diagnosis not present

## 2016-07-30 ENCOUNTER — Ambulatory Visit
Admission: RE | Admit: 2016-07-30 | Discharge: 2016-07-30 | Disposition: A | Discharge status: Home / Self Care | Payer: Medicare Other | Source: Ambulatory Visit | Attending: Nurse Practitioner | Admitting: Nurse Practitioner

## 2016-07-30 DIAGNOSIS — Z1231 Encounter for screening mammogram for malignant neoplasm of breast: Secondary | ICD-10-CM

## 2016-07-30 NOTE — Addendum Note (Signed)
Addended by: Nelda SevereMAY, Somer Trotter A on: 07/30/2016 09:53 AM   Modules accepted: Orders

## 2016-08-20 ENCOUNTER — Other Ambulatory Visit: Payer: Self-pay | Admitting: Nurse Practitioner

## 2016-08-21 ENCOUNTER — Other Ambulatory Visit: Payer: Self-pay | Admitting: Nurse Practitioner

## 2016-08-21 ENCOUNTER — Ambulatory Visit
Admission: RE | Admit: 2016-08-21 | Discharge: 2016-08-21 | Disposition: A | Discharge status: Home / Self Care | Payer: Medicare Other | Source: Ambulatory Visit | Attending: Nurse Practitioner | Admitting: Nurse Practitioner

## 2016-08-21 ENCOUNTER — Other Ambulatory Visit: Payer: Medicare Other

## 2016-08-21 DIAGNOSIS — M85852 Other specified disorders of bone density and structure, left thigh: Secondary | ICD-10-CM | POA: Diagnosis not present

## 2016-08-21 DIAGNOSIS — E2839 Other primary ovarian failure: Secondary | ICD-10-CM

## 2016-08-21 DIAGNOSIS — Z780 Asymptomatic menopausal state: Secondary | ICD-10-CM | POA: Diagnosis not present

## 2016-09-01 ENCOUNTER — Telehealth: Payer: Self-pay | Admitting: *Deleted

## 2016-09-01 NOTE — Telephone Encounter (Signed)
Patient called and stated that you told her to call you if her BP remains in the 140's.  09/01/16- 140/76 08/31/16- 148/101  Haven't taken anymore readings down but have been staying around the 140's, Nothing higher than the 148/101. Been taking medication as directed. Please Advise.

## 2016-09-01 NOTE — Telephone Encounter (Signed)
Patient stated that her BP's having been staying in the 140's with yesterday being the highest. Patient stated she is not eating foods high in sodium. Please Advise.

## 2016-09-01 NOTE — Telephone Encounter (Signed)
Have her blood pressures been normal other than these or did she just start taking her blood pressures?  Make sure she is on a low sodium diet. Not eating a lot of foods high in sodium like canned foods or frozen dinners

## 2016-09-02 NOTE — Telephone Encounter (Signed)
Thank you. To cont low sodium diet and exercise as tolerated for 30 mins/ 5 days a week. Cont current medication for now and we will monitor.

## 2016-09-02 NOTE — Telephone Encounter (Signed)
Patient notified and agreed.  

## 2016-10-16 ENCOUNTER — Encounter: Payer: Self-pay | Admitting: Nurse Practitioner

## 2016-10-16 ENCOUNTER — Ambulatory Visit (INDEPENDENT_AMBULATORY_CARE_PROVIDER_SITE_OTHER): Payer: Medicare Other | Admitting: Nurse Practitioner

## 2016-10-16 VITALS — BP 138/86 | HR 85 | Temp 98.5°F | Resp 18 | Ht 62.0 in | Wt 155.8 lb

## 2016-10-16 DIAGNOSIS — Z720 Tobacco use: Secondary | ICD-10-CM

## 2016-10-16 DIAGNOSIS — M8580 Other specified disorders of bone density and structure, unspecified site: Secondary | ICD-10-CM | POA: Diagnosis not present

## 2016-10-16 DIAGNOSIS — I10 Essential (primary) hypertension: Secondary | ICD-10-CM

## 2016-10-16 NOTE — Patient Instructions (Addendum)
DASH Eating Plan DASH stands for "Dietary Approaches to Stop Hypertension." The DASH eating plan is a healthy eating plan that has been shown to reduce high blood pressure (hypertension). Additional health benefits may include reducing the risk of type 2 diabetes mellitus, heart disease, and stroke. The DASH eating plan may also help with weight loss. What do I need to know about the DASH eating plan? For the DASH eating plan, you will follow these general guidelines:  Choose foods with less than 150 milligrams of sodium per serving (as listed on the food label).  Use salt-free seasonings or herbs instead of table salt or sea salt.  Check with your health care provider or pharmacist before using salt substitutes.  Eat lower-sodium products. These are often labeled as "low-sodium" or "no salt added."  Eat fresh foods. Avoid eating a lot of canned foods.  Eat more vegetables, fruits, and low-fat dairy products.  Choose whole grains. Look for the word "whole" as the first word in the ingredient list.  Choose fish and skinless chicken or turkey more often than red meat. Limit fish, poultry, and meat to 6 oz (170 g) each day.  Limit sweets, desserts, sugars, and sugary drinks.  Choose heart-healthy fats.  Eat more home-cooked food and less restaurant, buffet, and fast food.  Limit fried foods.  Do not fry foods. Cook foods using methods such as baking, boiling, grilling, and broiling instead.  When eating at a restaurant, ask that your food be prepared with less salt, or no salt if possible. What foods can I eat? Seek help from a dietitian for individual calorie needs. Grains  Whole grain or whole wheat bread. Brown rice. Whole grain or whole wheat pasta. Quinoa, bulgur, and whole grain cereals. Low-sodium cereals. Corn or whole wheat flour tortillas. Whole grain cornbread. Whole grain crackers. Low-sodium crackers. Vegetables  Fresh or frozen vegetables (raw, steamed, roasted, or  grilled). Low-sodium or reduced-sodium tomato and vegetable juices. Low-sodium or reduced-sodium tomato sauce and paste. Low-sodium or reduced-sodium canned vegetables. Fruits  All fresh, canned (in natural juice), or frozen fruits. Meat and Other Protein Products  Ground beef (85% or leaner), grass-fed beef, or beef trimmed of fat. Skinless chicken or turkey. Ground chicken or turkey. Pork trimmed of fat. All fish and seafood. Eggs. Dried beans, peas, or lentils. Unsalted nuts and seeds. Unsalted canned beans. Dairy  Low-fat dairy products, such as skim or 1% milk, 2% or reduced-fat cheeses, low-fat ricotta or cottage cheese, or plain low-fat yogurt. Low-sodium or reduced-sodium cheeses. Fats and Oils  Tub margarines without trans fats. Light or reduced-fat mayonnaise and salad dressings (reduced sodium). Avocado. Safflower, olive, or canola oils. Natural peanut or almond butter. Other  Unsalted popcorn and pretzels. The items listed above may not be a complete list of recommended foods or beverages. Contact your dietitian for more options.  What foods are not recommended? Grains  White bread. White pasta. White rice. Refined cornbread. Bagels and croissants. Crackers that contain trans fat. Vegetables  Creamed or fried vegetables. Vegetables in a cheese sauce. Regular canned vegetables. Regular canned tomato sauce and paste. Regular tomato and vegetable juices. Fruits  Canned fruit in light or heavy syrup. Fruit juice. Meat and Other Protein Products  Fatty cuts of meat. Ribs, chicken wings, bacon, sausage, bologna, salami, chitterlings, fatback, hot dogs, bratwurst, and packaged luncheon meats. Salted nuts and seeds. Canned beans with salt. Dairy  Whole or 2% milk, cream, half-and-half, and cream cheese. Whole-fat or sweetened yogurt. Full-fat cheeses   or blue cheese. Nondairy creamers and whipped toppings. Processed cheese, cheese spreads, or cheese curds. Condiments  Onion and garlic  salt, seasoned salt, table salt, and sea salt. Canned and packaged gravies. Worcestershire sauce. Tartar sauce. Barbecue sauce. Teriyaki sauce. Soy sauce, including reduced sodium. Steak sauce. Fish sauce. Oyster sauce. Cocktail sauce. Horseradish. Ketchup and mustard. Meat flavorings and tenderizers. Bouillon cubes. Hot sauce. Tabasco sauce. Marinades. Taco seasonings. Relishes. Fats and Oils  Butter, stick margarine, lard, shortening, ghee, and bacon fat. Coconut, palm kernel, or palm oils. Regular salad dressings. Other  Pickles and olives. Salted popcorn and pretzels. The items listed above may not be a complete list of foods and beverages to avoid. Contact your dietitian for more information.  Where can I find more information? National Heart, Lung, and Blood Institute: www.nhlbi.nih.gov/health/health-topics/topics/dash/ This information is not intended to replace advice given to you by your health care provider. Make sure you discuss any questions you have with your health care provider. Document Released: 08/28/2011 Document Revised: 02/14/2016 Document Reviewed: 07/13/2013 Elsevier Interactive Patient Education  2017 Elsevier Inc.  

## 2016-10-16 NOTE — Progress Notes (Signed)
Careteam: Patient Care Team: [name], NP as PCP - General (Nurse Practitioner)  Advanced Directive information Does Patient Have a Medical Advance Directive?: Yes, Type of Advance Directive: Living will  No Known Allergies  Chief Complaint  Patient presents with  . Medical Management of Chronic Issues    3 month routine follow up.      HPI: Patient is a 66 y.o. female seen in the office today for routine follow up for HTN. Pt reports no issues or new concerns at this time. She reports that her home BP machine has not been working for the past 2 weeks and she is in the process of getting this fixed at this time. Home BP readings prior to this have been running around 140's SBP. Better today at 138/86. She continues smoking, although she admits to trying to quit with some difficulty. Reports medication and diet adherence. Requesting some low sodium diet fact sheets.  DEXA scan completed 07/2016 which shows osteopenia with a T score of -1.6. Pt has now stopped her Fosamax at this time (she had previously been taking for 7 years)  Review of Systems:  Review of Systems  Constitutional: Negative for activity change, appetite change, chills, fatigue and fever.  Respiratory: Negative for chest tightness, shortness of breath and wheezing.   Cardiovascular: Negative for chest pain and palpitations.  Neurological: Negative for dizziness and headaches.    Past Medical History:  Diagnosis Date  . Chlamydia trachomatis infection of unspecified genitourinary site 12/18/2011  . Cough   . Disorder of bone and cartilage, unspecified   . Edema   . Hypercalcemia 02/14/2015  . Hyperlipemia   . Hyperparathyroidism   . Hypertension   . Osteopenia   . Other abnormal blood chemistry   . Schizophrenia (HCC)    "little bit" (02/23/2015)  . Tachycardia, unspecified 05/29/2010  . Tobacco use disorder   . Unspecified glaucoma(365.9)   . Vitamin D deficiency    Past Surgical History:    Procedure Laterality Date  . NO PAST SURGERIES    . PARATHYROIDECTOMY N/A 03/15/2015   Procedure: PARATHYROIDECTOMY AND NECK EXPLORATION;  Surgeon: Darnell Level, MD;  Location[facility] OR;  Service: General;  Laterality: N/A;   Social History:   reports that she has been smoking Cigarettes.  She has a 5.64 pack-year smoking history. She has never used smokeless tobacco. She reports that she drinks alcohol. She reports that she does not use drugs.  Family History  Problem Relation Age of Onset  . Cancer Mother   . Cancer Brother     Throat  . Hypertension Other   . Diabetes Other     Medications: Patient's Medications  New Prescriptions   No medications on file  Previous Medications   ATENOLOL (TENORMIN) 50 MG TABLET    TAKE 1 TABLET BY MOUTH EVERY DAY   ATORVASTATIN (LIPITOR) 20 MG TABLET    TAKE 1 TABLET (20 MG TOTAL) BY MOUTH DAILY.   CALCIUM CARBONATE (OSCAL) 1500 (600 CA) MG TABS TABLET    Take 1,500 mg by mouth daily.   CHOLECALCIFEROL (VITAMIN D3) 10000 UNITS TABS    Take by mouth. Take 2 tablets by mouth once a day   DORZOLAMIDE (TRUSOPT) 2 % OPHTHALMIC SOLUTION    Place 1 drop into both eyes 3 (three) times daily.   LUMIGAN 0.01 % SOLN    Place 1 drop into both eyes at bedtime.   MAGNESIUM GLUCONATE (MAGONATE) 500 MG TABLET  Take 1 tablet (500 mg total) by mouth daily.   THIORIDAZINE (MELLARIL) 50 MG TABLET    TAKE 1 OR 2 TABLETS BY MOUTH AT BEDTIME  Modified Medications   No medications on file  Discontinued Medications   ALENDRONATE (FOSAMAX) 70 MG TABLET    TAKE 1 TABLET ONCE WEEKLY TO TREAT OSTEOPOROSIS.TAKE WITH A FULL GLASS OF WATER ON AN EMPTY STOMACH.     Physical Exam:  Vitals:   10/16/16 1358  BP: 138/86  Pulse: 85  Resp: 18  Temp: 98.5 F (36.9 C)  TempSrc: Oral  SpO2: 98%  Weight: 155 lb 12.8 oz (70.7 kg)  Height: 5\' 2"  (1.575 m)   Body mass index is 28.5 kg/m.  Physical Exam  Constitutional: She is oriented to person, place, and time. She  appears well-developed and well-nourished. No distress.  HENT:  Head: Normocephalic and atraumatic.  Eyes: Pupils are equal, round, and reactive to light.  Cardiovascular: Normal rate, regular rhythm and normal heart sounds.   No murmur heard. Pulmonary/Chest: Effort normal and breath sounds normal. No respiratory distress. She has no wheezes. She exhibits no tenderness.  Neurological: She is alert and oriented to person, place, and time.  Skin: Skin is warm and dry.  Psychiatric: She has a normal mood and affect.    Labs reviewed: Basic Metabolic Panel:  Recent Labs  86/57/8407/13/17 1501 05/15/16 1111 07/11/16 1052  NA 144 146 142  K 3.5 3.4* 3.6  CL 107 109 105  CO2 22 22 25   GLUCOSE 147* 120* 106*  BUN 8 9 7   CREATININE 1.08* 0.92 0.93  CALCIUM 9.8 9.2 9.0   Liver Function Tests:  Recent Labs  04/03/16 1501 07/11/16 1052  AST 35 36*  ALT 33* 26  ALKPHOS 54 55  BILITOT 0.4 0.5  PROT 6.8 7.0  ALBUMIN 4.3 4.3   No results for input(s): LIPASE, AMYLASE in the last 8760 hours. No results for input(s): AMMONIA in the last 8760 hours. CBC:  Recent Labs  04/03/16 1501  WBC 9.1  NEUTROABS 5,551  HGB 13.1  HCT 38.8  MCV 91.1  PLT 229   Lipid Panel:  Recent Labs  05/15/16 1111  CHOL 111*  HDL 56  LDLCALC 19  TRIG 180*  CHOLHDL 2.0   TSH: No results for input(s): TSH in the last 8760 hours. A1C: Lab Results  Component Value Date   HGBA1C 5.3 07/11/2016     Assessment/Plan 1. Essential hypertension -Stable, continue atenolol and checking BP daily after BP medication. Adhere to a low sodium diet.  2. Osteopenia determined by x-ray -Stable, DEXA scan completed and shows osteopenia with a T-score of -1.6. Fosamax stopped and she remains on calcium carbonate and cholcalciferol supplementation.   3. Tobacco abuse -Continue to encourage smoking cessation and the harms of tobacco use.   To keep scheduled CPE appt; Call office if needed between now and  then   [facility] Center LLCJessica K. Biagio BorgEubanks, AGNP  [facility] [phone](Monday-Friday 8 am - 5 pm) [phone] (after hours)

## 2016-10-21 ENCOUNTER — Other Ambulatory Visit: Payer: Self-pay | Admitting: Nurse Practitioner

## 2016-11-27 ENCOUNTER — Other Ambulatory Visit: Payer: Self-pay | Admitting: *Deleted

## 2016-11-27 MED ORDER — ATENOLOL 50 MG PO TABS: 50.0000 mg | ORAL_TABLET | Freq: Every day | ORAL | 1 refills | Status: DC

## 2016-11-27 NOTE — Telephone Encounter (Signed)
[facility] 

## 2017-01-06 ENCOUNTER — Ambulatory Visit: Payer: Medicare Other

## 2017-01-06 ENCOUNTER — Encounter: Payer: Medicare Other | Admitting: Nurse Practitioner

## 2017-01-08 ENCOUNTER — Other Ambulatory Visit: Payer: Self-pay | Admitting: Nurse Practitioner

## 2017-02-09 ENCOUNTER — Ambulatory Visit: Payer: Medicare Other

## 2017-02-09 ENCOUNTER — Encounter: Payer: Medicare Other | Admitting: Nurse Practitioner

## 2017-02-11 ENCOUNTER — Ambulatory Visit (INDEPENDENT_AMBULATORY_CARE_PROVIDER_SITE_OTHER): Payer: Medicare Other

## 2017-02-11 VITALS — BP 122/80 | HR 81 | Temp 98.4°F | Ht 62.0 in | Wt 151.0 lb

## 2017-02-11 DIAGNOSIS — Z0000 Encounter for general adult medical examination without abnormal findings: Secondary | ICD-10-CM | POA: Diagnosis not present

## 2017-02-11 NOTE — Progress Notes (Signed)
Quick Notes   Health Maintenance: HEP C screen, hiv screen, and urine due     Abnormal Screen: <65 years, no MMSE due    Patient Concerns: None    Nurse Concerns: None

## 2017-02-11 NOTE — Patient Instructions (Signed)
[name] , Thank you for taking time to come for your Medicare Wellness Visit. I appreciate your ongoing commitment to your health goals. Please review the following plan we discussed and let me know if I can assist you in the future.   Screening recommendations/referrals: Colonoscopy due 07/18/2024 Mammogram due 07/31/2018 Bone Density up to date Recommended yearly ophthalmology/optometry visit for glaucoma screening and checkup Recommended yearly dental visit for hygiene and checkup  Vaccinations: Influenza vaccine due 05/15/2017 Pneumococcal vaccine up to date Tdap vaccine up to date. Due 12/17/2021 Shingles vaccine due at age 65  Advanced directives: Need a copy for the chart, please bring in next time.  Conditions/risks identified: None  Next appointment: Betty Billow NP 02/17/17 @ 10:45am  Preventive Care 65-64 Years, Female Preventive care refers to lifestyle choices and visits with your health care provider that can promote health and wellness. What does preventive care include?  A yearly physical exam. This is also called an annual well check.  Dental exams once or twice a year.  Routine eye exams. Ask your health care provider how often you should have your eyes checked.  Personal lifestyle choices, including:  Daily care of your teeth and gums.  Regular physical activity.  Eating a healthy diet.  Avoiding tobacco and drug use.  Limiting alcohol use.  Practicing safe sex.  Taking low-dose aspirin daily starting at age 65.  Taking vitamin and mineral supplements as recommended by your health care provider. What happens during an annual well check? The services and screenings done by your health care provider during your annual well check will depend on your age, overall health, lifestyle risk factors, and family history of disease. Counseling  Your health care provider may ask you questions about your:  Alcohol use.  Tobacco use.  Drug use.  Emotional  well-being.  Home and relationship well-being.  Sexual activity.  Eating habits.  Work and work Statistician.  Method of birth control.  Menstrual cycle.  Pregnancy history. Screening  You may have the following tests or measurements:  Height, weight, and BMI.  Blood pressure.  Lipid and cholesterol levels. These may be checked every 5 years, or more frequently if you are over 60 years old.  Skin check.  Lung cancer screening. You may have this screening every year starting at age 65 if you have a 30-pack-year history of smoking and currently smoke or have quit within the past 15 years.  Fecal occult blood test (FOBT) of the stool. You may have this test every year starting at age 65.  Flexible sigmoidoscopy or colonoscopy. You may have a sigmoidoscopy every 5 years or a colonoscopy every 10 years starting at age 65.  Hepatitis C blood test.  Hepatitis B blood test.  Sexually transmitted disease (STD) testing.  Diabetes screening. This is done by checking your blood sugar (glucose) after you have not eaten for a while (fasting). You may have this done every 1-3 years.  Mammogram. This may be done every 1-2 years. Talk to your health care provider about when you should start having regular mammograms. This may depend on whether you have a family history of breast cancer.  BRCA-related cancer screening. This may be done if you have a family history of breast, ovarian, tubal, or peritoneal cancers.  Pelvic exam and Pap test. This may be done every 3 years starting at age 65. Starting at age 65, this may be done every 5 years if you have a Pap test in combination with an  HPV test.  Bone density scan. This is done to screen for osteoporosis. You may have this scan if you are at high risk for osteoporosis. Discuss your test results, treatment options, and if necessary, the need for more tests with your health care provider. Vaccines  Your health care provider may recommend  certain vaccines, such as:  Influenza vaccine. This is recommended every year.  Tetanus, diphtheria, and acellular pertussis (Tdap, Td) vaccine. You may need a Td booster every 10 years.  Zoster vaccine. You may need this after age 3.  Pneumococcal 13-valent conjugate (PCV13) vaccine. You may need this if you have certain conditions and were not previously vaccinated.  Pneumococcal polysaccharide (PPSV23) vaccine. You may need one or two doses if you smoke cigarettes or if you have certain conditions. Talk to your health care provider about which screenings and vaccines you need and how often you need them. This information is not intended to replace advice given to you by your health care provider. Make sure you discuss any questions you have with your health care provider. Document Released: 10/05/2015 Document Revised: 05/28/2016 Document Reviewed: 07/10/2015 Elsevier Interactive Patient Education  2017 [place] Prevention in the Home Falls can cause injuries. They can happen to people of all ages. There are many things you can do to make your home safe and to help prevent falls. What can I do on the outside of my home?  Regularly fix the edges of walkways and driveways and fix any cracks.  Remove anything that might make you trip as you walk through a door, such as a raised step or threshold.  Trim any bushes or trees on the path to your home.  Use bright outdoor lighting.  Clear any walking paths of anything that might make someone trip, such as rocks or tools.  Regularly check to see if handrails are loose or broken. Make sure that both sides of any steps have handrails.  Any raised decks and porches should have guardrails on the edges.  Have any leaves, snow, or ice cleared regularly.  Use sand or salt on walking paths during winter.  Clean up any spills in your garage right away. This includes oil or grease spills. What can I do in the bathroom?  Use  night lights.  Install grab bars by the toilet and in the tub and shower. Do not use towel bars as grab bars.  Use non-skid mats or decals in the tub or shower.  If you need to sit down in the shower, use a plastic, non-slip stool.  Keep the floor dry. Clean up any water that spills on the floor as soon as it happens.  Remove soap buildup in the tub or shower regularly.  Attach bath mats securely with double-sided non-slip rug tape.  Do not have throw rugs and other things on the floor that can make you trip. What can I do in the bedroom?  Use night lights.  Make sure that you have a light by your bed that is easy to reach.  Do not use any sheets or blankets that are too big for your bed. They should not hang down onto the floor.  Have a firm chair that has side arms. You can use this for support while you get dressed.  Do not have throw rugs and other things on the floor that can make you trip. What can I do in the kitchen?  Clean up any spills right away.  Avoid walking on wet floors.  Keep items that you use a lot in easy-to-reach places.  If you need to reach something above you, use a strong step stool that has a grab bar.  Keep electrical cords out of the way.  Do not use floor polish or wax that makes floors slippery. If you must use wax, use non-skid floor wax.  Do not have throw rugs and other things on the floor that can make you trip. What can I do with my stairs?  Do not leave any items on the stairs.  Make sure that there are handrails on both sides of the stairs and use them. Fix handrails that are broken or loose. Make sure that handrails are as long as the stairways.  Check any carpeting to make sure that it is firmly attached to the stairs. Fix any carpet that is loose or worn.  Avoid having throw rugs at the top or bottom of the stairs. If you do have throw rugs, attach them to the floor with carpet tape.  Make sure that you have a light switch at  the top of the stairs and the bottom of the stairs. If you do not have them, ask someone to add them for you. What else can I do to help prevent falls?  Wear shoes that:  Do not have high heels.  Have rubber bottoms.  Are comfortable and fit you well.  Are closed at the toe. Do not wear sandals.  If you use a stepladder:  Make sure that it is fully opened. Do not climb a closed stepladder.  Make sure that both sides of the stepladder are locked into place.  Ask someone to hold it for you, if possible.  Clearly mark and make sure that you can see:  Any grab bars or handrails.  First and last steps.  Where the edge of each step is.  Use tools that help you move around (mobility aids) if they are needed. These include:  Canes.  Walkers.  Scooters.  Crutches.  Turn on the lights when you go into a dark area. Replace any light bulbs as soon as they burn out.  Set up your furniture so you have a clear path. Avoid moving your furniture around.  If any of your floors are uneven, fix them.  If there are any pets around you, be aware of where they are.  Review your medicines with your doctor. Some medicines can make you feel dizzy. This can increase your chance of falling. Ask your doctor what other things that you can do to help prevent falls. This information is not intended to replace advice given to you by your health care provider. Make sure you discuss any questions you have with your health care provider. Document Released: 07/05/2009 Document Revised: 02/14/2016 Document Reviewed: 10/13/2014 Elsevier Interactive Patient Education  2017 [employer].

## 2017-02-11 NOTE — Progress Notes (Signed)
Subjective:   [name] is a 66 y.o. female who presents for an Initial Medicare Annual Wellness Visit.       Objective:    Today's Vitals   02/11/17 1056  BP: 122/80  Pulse: 81  Temp: 98.4 F (36.9 C)  TempSrc: Oral  SpO2: 99%  Weight: 151 lb (68.5 kg)  Height: 5\' 2"  (1.575 m)   Body mass index is 27.62 kg/m.   Current Medications (verified) Outpatient Encounter Prescriptions as of 02/11/2017  Medication Sig  . atenolol (TENORMIN) 50 MG tablet Take 1 tablet (50 mg total) by mouth daily.  Marland Kitchen atorvastatin (LIPITOR) 20 MG tablet TAKE 1 TABLET (20 MG TOTAL) BY MOUTH DAILY.  . calcium carbonate (OSCAL) 1500 (600 Ca) MG TABS tablet Take 1,500 mg by mouth daily.  . Cholecalciferol (VITAMIN D3) 10000 units TABS Take by mouth. Take 2 tablets by mouth once a day  . dorzolamide (TRUSOPT) 2 % ophthalmic solution Place 1 drop into both eyes 3 (three) times daily.  Marland Kitchen LUMIGAN 0.01 % SOLN Place 1 drop into both eyes at bedtime.  . magnesium gluconate (MAGONATE) 500 MG tablet Take 1 tablet (500 mg total) by mouth daily.  Marland Kitchen thioridazine (MELLARIL) 50 MG tablet TAKE 1 OR 2 TABLETS BY MOUTH AT BEDTIME   No facility-administered encounter medications on file as of 02/11/2017.     Allergies (verified) Patient has no known allergies.   History: Past Medical History:  Diagnosis Date  . Chlamydia trachomatis infection of unspecified genitourinary site 12/18/2011  . Cough   . Disorder of bone and cartilage, unspecified   . Edema   . Hypercalcemia 02/14/2015  . Hyperlipemia   . Hyperparathyroidism   . Hypertension   . Osteopenia   . Other abnormal blood chemistry   . Schizophrenia (HCC)    "little bit" (02/23/2015)  . Tachycardia, unspecified 05/29/2010  . Tobacco use disorder   . Unspecified glaucoma(365.9)   . Vitamin D deficiency    Past Surgical History:  Procedure Laterality Date  . NO PAST SURGERIES    . PARATHYROIDECTOMY N/A 03/15/2015   Procedure: PARATHYROIDECTOMY  AND NECK EXPLORATION;  Surgeon: Darnell Level, MD;  Location: [facility] OR;  Service: General;  Laterality: N/A;   Family History  Problem Relation Age of Onset  . Cancer Mother   . Cancer Brother        Throat  . Hypertension Other   . Diabetes Other    Social History   Occupational History  . Not on file.   Social History Main Topics  . Smoking status: Current Some Day Smoker    Packs/day: 0.12    Years: 47.00    Types: Cigarettes  . Smokeless tobacco: Never Used     Comment: Smokes every now and then, not everyday  . Alcohol use No  . Drug use: No  . Sexual activity: Yes    Tobacco Counseling Ready to quit: Not Answered Counseling given: Not Answered   Activities of Daily Living In your present state of health, do you have any difficulty performing the following activities: 02/11/2017  Hearing? N  Vision? N  Difficulty concentrating or making decisions? N  Walking or climbing stairs? N  Dressing or bathing? N  Doing errands, shopping? N  Preparing Food and eating ? N  Using the Toilet? N  In the past six months, have you accidently leaked urine? N  Do you have problems with loss of bowel control? N  Managing your Medications? N  Managing your Finances? N  Housekeeping or managing your Housekeeping? N  Some recent data might be hidden    Immunizations and Health Maintenance Immunization History  Administered Date(s) Administered  . Influenza,inj,Quad PF,36+ Mos 06/14/2013, 07/18/2014, 05/15/2016  . Pneumococcal Conjugate-13 03/15/2014  . Pneumococcal Polysaccharide-23 01/02/2016  . Td 09/23/1995  . Tdap 12/18/2011   Health Maintenance Due  Topic Date Due  . URINE MICROALBUMIN  08/05/1961    Patient Care Team: [name], NP as PCP - General (Nurse Practitioner)  Indicate any recent Medical Services you may have received from other than [facility] providers in the past year (date may be approximate).     Assessment:   This is a routine wellness  examination for [name].   Hearing/Vision screen No exam data present  Dietary issues and exercise activities discussed: Current Exercise Habits: Home exercise routine, Type of exercise: Other - see comments (jumping jacks, etc), Time (Minutes): 30, Frequency (Times/Week): 6, Weekly Exercise (Minutes/Week): 180, Intensity: Mild, Exercise limited by: None identified  Goals    . Selfcare          Starting today I will start back into the choir at church.      Depression Screen PHQ 2/9 Scores 02/11/2017 01/02/2016 04/25/2015 03/30/2015 03/08/2015 01/25/2015 07/18/2014  PHQ - 2 Score 0 0 0 0 0 0 0  PHQ- 9 Score - - - - - - -    Fall Risk Fall Risk  02/11/2017 10/16/2016 07/15/2016 04/03/2016 01/02/2016  Falls in the past year? No No No No No  Number falls in past yr: - - - - -  Injury with Fall? - - - - -  Risk for fall due to : - - - - -    Cognitive Function: Within Normal Limits MMSE - Mini Mental State Exam 01/02/2016 03/15/2014  Not completed: (No Data) -  Orientation to time 5 5  Orientation to Place 5 5  Registration 3 3  Attention/ Calculation 4 4  Recall 2 1  Language- name 2 objects 2 2  Language- repeat 1 1  Language- follow 3 step command 3 3  Language- read & follow direction 1 1  Write a sentence 1 1  Copy design 0 0  Total score 27 26        Screening Tests Health Maintenance  Topic Date Due  . URINE MICROALBUMIN  08/05/1961  . Hepatitis C Screening  04/21/2024 (Originally September 16, 1951)  . HIV Screening  04/21/2024 (Originally 08/06/1966)  . INFLUENZA VACCINE  04/22/2017  . MAMMOGRAM  07/30/2018  . PAP SMEAR  01/02/2019  . TETANUS/TDAP  12/17/2021  . COLONOSCOPY  07/18/2024      Plan:  I have personally reviewed and addressed the Medicare Annual Wellness questionnaire and have noted the following in the patient's chart:  A. Medical and social history B. Use of alcohol, tobacco or illicit drugs  C. Current medications and supplements D. Functional ability and  status E.  Nutritional status F.  Physical activity G. Advance directives H. List of other physicians I.  Hospitalizations, surgeries, and ER visits in previous 12 months J.  Vitals K. Screenings to include hearing, vision, cognitive, depression L. Referrals and appointments - none  In addition, I have reviewed and discussed with patient certain preventive protocols, quality metrics, and best practice recommendations. A written personalized care plan for preventive services as well as general preventive health recommendations were provided to patient.  See attached scanned questionnaire for additional information.   Signed,  Annetta MawSara Gonthier, RN Nurse Health Advisor       Gaetano NetSara E Gonthier, RN   02/11/2017   I have reviewed the health advisor's note and was available for consultation. I agree with documentation and plan.   Adda Stokes S. Ancil Linseyarter, D. O., F. A. C. O. I.  [facility] [address] [place], [place] [address] [phone] Cell (Monday-Friday 8 AM - 5 PM) [phone] After 5 PM and follow prompts

## 2017-02-17 ENCOUNTER — Encounter: Payer: Self-pay | Admitting: Nurse Practitioner

## 2017-02-17 ENCOUNTER — Ambulatory Visit (INDEPENDENT_AMBULATORY_CARE_PROVIDER_SITE_OTHER): Payer: Medicare Other | Admitting: Nurse Practitioner

## 2017-02-17 VITALS — BP 144/86 | HR 88 | Temp 98.4°F | Resp 17 | Ht 62.0 in | Wt 150.6 lb

## 2017-02-17 DIAGNOSIS — E782 Mixed hyperlipidemia: Secondary | ICD-10-CM | POA: Diagnosis not present

## 2017-02-17 DIAGNOSIS — R739 Hyperglycemia, unspecified: Secondary | ICD-10-CM | POA: Diagnosis not present

## 2017-02-17 DIAGNOSIS — Z0000 Encounter for general adult medical examination without abnormal findings: Secondary | ICD-10-CM | POA: Diagnosis not present

## 2017-02-17 DIAGNOSIS — Z720 Tobacco use: Secondary | ICD-10-CM

## 2017-02-17 DIAGNOSIS — I10 Essential (primary) hypertension: Secondary | ICD-10-CM | POA: Diagnosis not present

## 2017-02-17 DIAGNOSIS — M8580 Other specified disorders of bone density and structure, unspecified site: Secondary | ICD-10-CM | POA: Diagnosis not present

## 2017-02-17 LAB — CBC WITH DIFFERENTIAL/PLATELET
BASOS PCT: 0 %
Basophils Absolute: 0 cells/uL (ref 0–200)
EOS PCT: 1 %
Eosinophils Absolute: 67 cells/uL (ref 15–500)
HCT: 38.9 % (ref 35.0–45.0)
Hemoglobin: 13 g/dL (ref 11.7–15.5)
LYMPHS PCT: 34 %
Lymphs Abs: 2278 cells/uL (ref 850–3900)
MCH: 30.2 pg (ref 27.0–33.0)
MCHC: 33.4 g/dL (ref 32.0–36.0)
MCV: 90.5 fL (ref 80.0–100.0)
MONOS PCT: 7 %
MPV: 10.3 fL (ref 7.5–12.5)
Monocytes Absolute: 469 cells/uL (ref 200–950)
Neutro Abs: 3886 cells/uL (ref 1500–7800)
Neutrophils Relative %: 58 %
PLATELETS: 152 10*3/uL (ref 140–400)
RBC: 4.3 MIL/uL (ref 3.80–5.10)
RDW: 14.7 % (ref 11.0–15.0)
WBC: 6.7 10*3/uL (ref 3.8–10.8)

## 2017-02-17 LAB — COMPLETE METABOLIC PANEL WITH GFR
ALT: 32 U/L — AB (ref 6–29)
AST: 38 U/L — ABNORMAL HIGH (ref 10–35)
Albumin: 4.1 g/dL (ref 3.6–5.1)
Alkaline Phosphatase: 56 U/L (ref 33–130)
BILIRUBIN TOTAL: 0.5 mg/dL (ref 0.2–1.2)
BUN: 9 mg/dL (ref 7–25)
CALCIUM: 9.1 mg/dL (ref 8.6–10.4)
CO2: 23 mmol/L (ref 20–31)
CREATININE: 1.1 mg/dL — AB (ref 0.50–0.99)
Chloride: 106 mmol/L (ref 98–110)
GFR, EST AFRICAN AMERICAN: 61 mL/min (ref >=60)
GFR, EST NON AFRICAN AMERICAN: 53 mL/min — AB (ref >=60)
Glucose, Bld: 109 mg/dL — ABNORMAL HIGH (ref 65–99)
Potassium: 3.9 mmol/L (ref 3.5–5.3)
Sodium: 141 mmol/L (ref 135–146)
TOTAL PROTEIN: 6.9 g/dL (ref 6.1–8.1)

## 2017-02-17 LAB — LIPID PANEL
Cholesterol: 146 mg/dL (ref <200)
HDL: 66 mg/dL (ref >50)
LDL CALC: 43 mg/dL (ref <100)
Total CHOL/HDL Ratio: 2.2 Ratio (ref <5.0)
Triglycerides: 184 mg/dL — ABNORMAL HIGH (ref <150)
VLDL: 37 mg/dL — ABNORMAL HIGH (ref <30)

## 2017-02-17 NOTE — Progress Notes (Signed)
Provider: [name], NP  Patient Care Team: [name], NP as PCP - General (Nurse Practitioner)  Extended Emergency Contact Information Primary Emergency Contact: Attaway,Amanda Address: [address]          [place] [address] [place] Home Phone: [phone] Relation: Sister Secondary Emergency Contact: Levonne Hubert [place] of [place] Home Phone: [phone] Mobile Phone: [phone] Relation: Sister No Known Allergies Code Status: DNR Goals of Care: Advanced Directive information Advanced Directives 02/17/2017  Does Patient Have a Medical Advance Directive? No  Type of Advance Directive -  Would patient like information on creating a medical advance directive? Yes (MAU/Ambulatory/Procedural Areas - Information given)     Chief Complaint  Patient presents with  . Medical Management of Chronic Issues    Pt is being seen for a complete physical.     HPI: Patient is a 66 y.o. female seen in today for an annual wellness exam.   Major illnesses or hospitalization in the last year: none  Depression screen [facility] 2/9 02/17/2017 02/11/2017 01/02/2016 04/25/2015 03/30/2015  Decreased Interest 0 0 0 0 0  Down, Depressed, Hopeless 0 0 0 0 0  PHQ - 2 Score 0 0 0 0 0  Altered sleeping - - - - -  Tired, decreased energy - - - - -  Change in appetite - - - - -  Feeling bad or failure about yourself  - - - - -  Trouble concentrating - - - - -  Moving slowly or fidgety/restless - - - - -  Suicidal thoughts - - - - -  PHQ-9 Score - - - - -    Fall Risk  02/17/2017 02/11/2017 10/16/2016 07/15/2016 04/03/2016  Falls in the past year? No No No No No  Number falls in past yr: - - - - -  Injury with Fall? - - - - -  Risk for fall due to : - - - - -   MMSE - Mini Mental State Exam 01/02/2016 03/15/2014  Not completed: (No Data) -  Orientation to time 5 5  Orientation to Place 5 5  Registration 3 3  Attention/ Calculation 4 4  Recall 2 1    Language- name 2 objects 2 2  Language- repeat 1 1  Language- follow 3 step command 3 3  Language- read & follow direction 1 1  Write a sentence 1 1  Copy design 0 0  Total score 27 26     Health Maintenance  Topic Date Due  . URINE MICROALBUMIN  08/05/1962  . Hepatitis C Screening  04/21/2024 (Originally 1951/04/19)  . HIV Screening  04/21/2024 (Originally 08/06/1966)  . INFLUENZA VACCINE  04/22/2017  . MAMMOGRAM  07/30/2018  . PAP SMEAR  01/02/2019  . TETANUS/TDAP  12/17/2021  . COLONOSCOPY  07/18/2024   Starting smoking cigarettes again- 1 pack of cigarette a week.   Diet? Diabetic diet. (less sweets and sodium) Exercise? occasional Dentition: needs to make appt, been abt a year Pain: none  Past Medical History:  Diagnosis Date  . Chlamydia trachomatis infection of unspecified genitourinary site 12/18/2011  . Cough   . Disorder of bone and cartilage, unspecified   . Edema   . Hypercalcemia 02/14/2015  . Hyperlipemia   . Hyperparathyroidism   . Hypertension   . Osteopenia   . Other abnormal blood chemistry   . Schizophrenia (HCC)    "little bit" (02/23/2015)  . Tachycardia, unspecified 05/29/2010  . Tobacco use disorder   .  Unspecified glaucoma(365.9)   . Vitamin D deficiency     Past Surgical History:  Procedure Laterality Date  . NO PAST SURGERIES    . PARATHYROIDECTOMY N/A 03/15/2015   Procedure: PARATHYROIDECTOMY AND NECK EXPLORATION;  Surgeon: Darnell Levelodd Gerkin, MD;  [facility];  Service: General;  Laterality: N/A;    Social History   Social History  . Marital status: Married    Spouse name: N/A  . Number of children: N/A  . Years of education: N/A   Social History Main Topics  . Smoking status: Current Some Day Smoker    Packs/day: 0.12    Years: 47.00    Types: Cigarettes  . Smokeless tobacco: Never Used     Comment: Smokes every now and then, not everyday  . Alcohol use No  . Drug use: No  . Sexual activity: Yes   Other Topics Concern   . None   Social History Narrative  . None    Family History  Problem Relation Age of Onset  . Cancer Mother   . Cancer Brother        Throat  . Hypertension Other   . Diabetes Other     Review of Systems:  Review of Systems  Constitutional: Negative for activity change, appetite change, chills, fatigue and fever.  HENT: Negative for tinnitus.   Eyes: Negative for visual disturbance.  Respiratory: Negative for cough, chest tightness, shortness of breath and wheezing.   Cardiovascular: Negative for chest pain, palpitations and leg swelling.  Gastrointestinal: Negative for abdominal pain, constipation and diarrhea.  Genitourinary: Negative for dysuria, frequency and urgency.  Musculoskeletal: Negative for back pain and myalgias.  Skin: Negative.   Neurological: Negative for dizziness and headaches.     Allergies as of 02/17/2017   No Known Allergies     Medication List       Accurate as of 02/17/17 11:13 AM. Always use your most recent med list.          atenolol 50 MG tablet Commonly known as:  TENORMIN Take 1 tablet (50 mg total) by mouth daily.   atorvastatin 20 MG tablet Commonly known as:  LIPITOR TAKE 1 TABLET (20 MG TOTAL) BY MOUTH DAILY.   calcium carbonate 1500 (600 Ca) MG Tabs tablet Commonly known as:  OSCAL Take 1,500 mg by mouth daily.   dorzolamide 2 % ophthalmic solution Commonly known as:  TRUSOPT Place 1 drop into both eyes 3 (three) times daily.   LUMIGAN 0.01 % Soln Generic drug:  bimatoprost Place 1 drop into both eyes at bedtime.   magnesium gluconate 500 MG tablet Commonly known as:  MAGONATE Take 1 tablet (500 mg total) by mouth daily.   thioridazine 50 MG tablet Commonly known as:  MELLARIL TAKE 1 OR 2 TABLETS BY MOUTH AT BEDTIME   Vitamin D3 10000 units Tabs Take by mouth. Take 2 tablets by mouth once a day         Physical Exam: Vitals:   02/17/17 1036  BP: (!) 144/86  Pulse: 88  Resp: 17  Temp: 98.4 F (36.9  C)  TempSrc: Oral  SpO2: 98%  Weight: 150 lb 9.6 oz (68.3 kg)  Height: 5\' 2"  (1.575 m)   Body mass index is 27.55 kg/m. Physical Exam  Constitutional: She is oriented to person, place, and time. She appears well-developed and well-nourished. No distress.  HENT:  Head: Normocephalic and atraumatic.  Mouth/Throat: Oropharynx is clear and moist. No oropharyngeal exudate.  Eyes: Conjunctivae are  normal. Pupils are equal, round, and reactive to light.  Neck: Normal range of motion. Neck supple.  Cardiovascular: Normal rate, regular rhythm and normal heart sounds.   Pulmonary/Chest: Effort normal and breath sounds normal.  Abdominal: Soft. Bowel sounds are normal.  Musculoskeletal: Normal range of motion. She exhibits no edema or tenderness.  Neurological: She is alert and oriented to person, place, and time. She has normal reflexes.  Skin: Skin is warm and dry. She is not diaphoretic.  Psychiatric: She has a normal mood and affect.    Labs reviewed: Basic Metabolic Panel:  Recent Labs  54/09/81 1501 05/15/16 1111 07/11/16 1052  NA 144 146 142  K 3.5 3.4* 3.6  CL 107 109 105  CO2 22 22 25   GLUCOSE 147* 120* 106*  BUN 8 9 7   CREATININE 1.08* 0.92 0.93  CALCIUM 9.8 9.2 9.0   Liver Function Tests:  Recent Labs  04/03/16 1501 07/11/16 1052  AST 35 36*  ALT 33* 26  ALKPHOS 54 55  BILITOT 0.4 0.5  PROT 6.8 7.0  ALBUMIN 4.3 4.3   No results for input(s): LIPASE, AMYLASE in the last 8760 hours. No results for input(s): AMMONIA in the last 8760 hours. CBC:  Recent Labs  04/03/16 1501  WBC 9.1  NEUTROABS 5,551  HGB 13.1  HCT 38.8  MCV 91.1  PLT 229   Lipid Panel:  Recent Labs  05/15/16 1111  CHOL 111*  HDL 56  LDLCALC 19  TRIG 180*  CHOLHDL 2.0   Lab Results  Component Value Date   HGBA1C 5.3 07/11/2016    Procedures: No results found.  Assessment/Plan  1. Wellness examination The patient was counseled regarding the appropriate use of  alcohol, regular self-examination of the breasts on a monthly basis, prevention of dental and periodontal disease, diet, regular sustained exercise for at least 30 minutes 5 times per week, routine screening interval for mammogram as recommended by the American Cancer Society and ACOG, importance of regular PAP smears,smoking cessation, tobacco use,  and recommended schedule for GI hemoccult testing, colonoscopy, cholesterol, thyroid and diabetes screening. - DNR (Do Not Resuscitate)  2. Essential hypertension Blood pressure stable. conts on atenolol.  - EKG 12-Lead NSR 71 bpm, poor r wave progression which was consistent with previous EKG.  - COMPLETE METABOLIC PANEL WITH GFR - CBC with Differential/Platelets  3. Tobacco abuse Smoking cessation encouraged  4. Hyperglycemia -diet modifications encouraged - Hemoglobin A1c  5. Osteopenia determined by x-ray -to take caltrate with D 600/400 twice a day.   6. Mixed hyperlipidemia -conts on lipitor - Lipid Panel   Jessica K. Biagio Borg  [facility] [phone] 8 am - 5 pm) [phone] (after hours)

## 2017-02-17 NOTE — Patient Instructions (Addendum)
To make appt with podiatrist and dentist.    Health Maintenance, Female Adopting a healthy lifestyle and getting preventive care can go a long way to promote health and wellness. Talk with your health care provider about what schedule of regular examinations is right for you. This is a good chance for you to check in with your provider about disease prevention and staying healthy. In between checkups, there are plenty of things you can do on your own. Experts have done a lot of research about which lifestyle changes and preventive measures are most likely to keep you healthy. Ask your health care provider for more information. Weight and diet Eat a healthy diet  Be sure to include plenty of vegetables, fruits, low-fat dairy products, and lean protein.  Do not eat a lot of foods high in solid fats, added sugars, or salt.  Get regular exercise. This is one of the most important things you can do for your health.  Most adults should exercise for at least 150 minutes each week. The exercise should increase your heart rate and make you sweat (moderate-intensity exercise).  Most adults should also do strengthening exercises at least twice a week. This is in addition to the moderate-intensity exercise. Maintain a healthy weight  Body mass index (BMI) is a measurement that can be used to identify possible weight problems. It estimates body fat based on height and weight. Your health care provider can help determine your BMI and help you achieve or maintain a healthy weight.  For females 64 years of age and older:  A BMI below 18.5 is considered underweight.  A BMI of 18.5 to 24.9 is normal.  A BMI of 25 to 29.9 is considered overweight.  A BMI of 30 and above is considered obese. Watch levels of cholesterol and blood lipids  You should start having your blood tested for lipids and cholesterol at 65 years of age, then have this test every 5 years.  You may need to have your cholesterol  levels checked more often if:  Your lipid or cholesterol levels are high.  You are older than 66 years of age.  You are at high risk for heart disease. Cancer screening Lung Cancer  Lung cancer screening is recommended for adults 82-73 years old who are at high risk for lung cancer because of a history of smoking.  A yearly low-dose CT scan of the lungs is recommended for people who:  Currently smoke.  Have quit within the past 15 years.  Have at least a 30-pack-year history of smoking. A pack year is smoking an average of one pack of cigarettes a day for 1 year.  Yearly screening should continue until it has been 15 years since you quit.  Yearly screening should stop if you develop a health problem that would prevent you from having lung cancer treatment. Breast Cancer  Practice breast self-awareness. This means understanding how your breasts normally appear and feel.  It also means doing regular breast self-exams. Let your health care provider know about any changes, no matter how small.  If you are in your 20s or 30s, you should have a clinical breast exam (CBE) by a health care provider every 1-3 years as part of a regular health exam.  If you are 28 or older, have a CBE every year. Also consider having a breast X-ray (mammogram) every year.  If you have a family history of breast cancer, talk to your health care provider about genetic screening.  If you are at high risk for breast cancer, talk to your health care provider about having an MRI and a mammogram every year.  Breast cancer gene (BRCA) assessment is recommended for women who have family members with BRCA-related cancers. BRCA-related cancers include:  Breast.  Ovarian.  Tubal.  Peritoneal cancers.  Results of the assessment will determine the need for genetic counseling and BRCA1 and BRCA2 testing. Cervical Cancer  Your health care provider may recommend that you be screened regularly for cancer of the  pelvic organs (ovaries, uterus, and vagina). This screening involves a pelvic examination, including checking for microscopic changes to the surface of your cervix (Pap test). You may be encouraged to have this screening done every 3 years, beginning at age 21.  For women ages 30-65, health care providers may recommend pelvic exams and Pap testing every 3 years, or they may recommend the Pap and pelvic exam, combined with testing for human papilloma virus (HPV), every 5 years. Some types of HPV increase your risk of cervical cancer. Testing for HPV may also be done on women of any age with unclear Pap test results.  Other health care providers may not recommend any screening for nonpregnant women who are considered low risk for pelvic cancer and who do not have symptoms. Ask your health care provider if a screening pelvic exam is right for you.  If you have had past treatment for cervical cancer or a condition that could lead to cancer, you need Pap tests and screening for cancer for at least 20 years after your treatment. If Pap tests have been discontinued, your risk factors (such as having a new sexual partner) need to be reassessed to determine if screening should resume. Some women have medical problems that increase the chance of getting cervical cancer. In these cases, your health care provider may recommend more frequent screening and Pap tests. Colorectal Cancer  This type of cancer can be detected and often prevented.  Routine colorectal cancer screening usually begins at 65 years of age and continues through 65 years of age.  Your health care provider may recommend screening at an earlier age if you have risk factors for colon cancer.  Your health care provider may also recommend using home test kits to check for hidden blood in the stool.  A small camera at the end of a tube can be used to examine your colon directly (sigmoidoscopy or colonoscopy). This is done to check for the earliest  forms of colorectal cancer.  Routine screening usually begins at age 50.  Direct examination of the colon should be repeated every 5-10 years through 65 years of age. However, you may need to be screened more often if early forms of precancerous polyps or small growths are found. Skin Cancer  Check your skin from head to toe regularly.  Tell your health care provider about any new moles or changes in moles, especially if there is a change in a mole's shape or color.  Also tell your health care provider if you have a mole that is larger than the size of a pencil eraser.  Always use sunscreen. Apply sunscreen liberally and repeatedly throughout the day.  Protect yourself by wearing long sleeves, pants, a wide-brimmed hat, and sunglasses whenever you are outside. Heart disease, diabetes, and high blood pressure  High blood pressure causes heart disease and increases the risk of stroke. High blood pressure is more likely to develop in:  People who have blood pressure in the   high end of the normal range (130-139/85-89 mm Hg).  People who are overweight or obese.  People who are African American.  If you are 18-39 years of age, have your blood pressure checked every 3-5 years. If you are 40 years of age or older, have your blood pressure checked every year. You should have your blood pressure measured twice-once when you are at a hospital or clinic, and once when you are not at a hospital or clinic. Record the average of the two measurements. To check your blood pressure when you are not at a hospital or clinic, you can use:  An automated blood pressure machine at a pharmacy.  A home blood pressure monitor.  If you are between 55 years and 79 years old, ask your health care provider if you should take aspirin to prevent strokes.  Have regular diabetes screenings. This involves taking a blood sample to check your fasting blood sugar level.  If you are at a normal weight and have a low  risk for diabetes, have this test once every three years after 65 years of age.  If you are overweight and have a high risk for diabetes, consider being tested at a younger age or more often. Preventing infection Hepatitis B  If you have a higher risk for hepatitis B, you should be screened for this virus. You are considered at high risk for hepatitis B if:  You were born in a country where hepatitis B is common. Ask your health care provider which countries are considered high risk.  Your parents were born in a high-risk country, and you have not been immunized against hepatitis B (hepatitis B vaccine).  You have HIV or AIDS.  You use needles to inject street drugs.  You live with someone who has hepatitis B.  You have had sex with someone who has hepatitis B.  You get hemodialysis treatment.  You take certain medicines for conditions, including cancer, organ transplantation, and autoimmune conditions. Hepatitis C  Blood testing is recommended for:  Everyone born from 1945 through 1965.  Anyone with known risk factors for hepatitis C. Sexually transmitted infections (STIs)  You should be screened for sexually transmitted infections (STIs) including gonorrhea and chlamydia if:  You are sexually active and are younger than 65 years of age.  You are older than 65 years of age and your health care provider tells you that you are at risk for this type of infection.  Your sexual activity has changed since you were last screened and you are at an increased risk for chlamydia or gonorrhea. Ask your health care provider if you are at risk.  If you do not have HIV, but are at risk, it may be recommended that you take a prescription medicine daily to prevent HIV infection. This is called pre-exposure prophylaxis (PrEP). You are considered at risk if:  You are sexually active and do not regularly use condoms or know the HIV status of your partner(s).  You take drugs by  injection.  You are sexually active with a partner who has HIV. Talk with your health care provider about whether you are at high risk of being infected with HIV. If you choose to begin PrEP, you should first be tested for HIV. You should then be tested every 3 months for as long as you are taking PrEP. Pregnancy  If you are premenopausal and you may become pregnant, ask your health care provider about preconception counseling.  If you may become   pregnant, take 400 to 800 micrograms (mcg) of folic acid every day.  If you want to prevent pregnancy, talk to your health care provider about birth control (contraception). Osteoporosis and menopause  Osteoporosis is a disease in which the bones lose minerals and strength with aging. This can result in serious bone fractures. Your risk for osteoporosis can be identified using a bone density scan.  If you are 1 years of age or older, or if you are at risk for osteoporosis and fractures, ask your health care provider if you should be screened.  Ask your health care provider whether you should take a calcium or vitamin D supplement to lower your risk for osteoporosis.  Menopause may have certain physical symptoms and risks.  Hormone replacement therapy may reduce some of these symptoms and risks. Talk to your health care provider about whether hormone replacement therapy is right for you. Follow these instructions at home:  Schedule regular health, dental, and eye exams.  Stay current with your immunizations.  Do not use any tobacco products including cigarettes, chewing tobacco, or electronic cigarettes.  If you are pregnant, do not drink alcohol.  If you are breastfeeding, limit how much and how often you drink alcohol.  Limit alcohol intake to no more than 1 drink per day for nonpregnant women. One drink equals 12 ounces of beer, 5 ounces of wine, or 1 ounces of hard liquor.  Do not use street drugs.  Do not share needles.  Ask  your health care provider for help if you need support or information about quitting drugs.  Tell your health care provider if you often feel depressed.  Tell your health care provider if you have ever been abused or do not feel safe at home. This information is not intended to replace advice given to you by your health care provider. Make sure you discuss any questions you have with your health care provider. Document Released: 03/24/2011 Document Revised: 02/14/2016 Document Reviewed: 06/12/2015 Elsevier Interactive Patient Education  2017 [employer].

## 2017-02-18 LAB — HEMOGLOBIN A1C
HEMOGLOBIN A1C: 5.4 % (ref <5.7)
MEAN PLASMA GLUCOSE: 108 mg/dL

## 2017-03-04 DIAGNOSIS — H401132 Primary open-angle glaucoma, bilateral, moderate stage: Secondary | ICD-10-CM | POA: Diagnosis not present

## 2017-04-15 DIAGNOSIS — H2513 Age-related nuclear cataract, bilateral: Secondary | ICD-10-CM | POA: Diagnosis not present

## 2017-05-18 DIAGNOSIS — H401132 Primary open-angle glaucoma, bilateral, moderate stage: Secondary | ICD-10-CM | POA: Diagnosis not present

## 2017-05-18 DIAGNOSIS — H25812 Combined forms of age-related cataract, left eye: Secondary | ICD-10-CM | POA: Diagnosis not present

## 2017-05-18 DIAGNOSIS — H2513 Age-related nuclear cataract, bilateral: Secondary | ICD-10-CM | POA: Diagnosis not present

## 2017-05-18 DIAGNOSIS — H401122 Primary open-angle glaucoma, left eye, moderate stage: Secondary | ICD-10-CM | POA: Diagnosis not present

## 2017-05-18 DIAGNOSIS — H409 Unspecified glaucoma: Secondary | ICD-10-CM | POA: Diagnosis not present

## 2017-05-19 DIAGNOSIS — H401132 Primary open-angle glaucoma, bilateral, moderate stage: Secondary | ICD-10-CM | POA: Diagnosis not present

## 2017-05-19 DIAGNOSIS — H2513 Age-related nuclear cataract, bilateral: Secondary | ICD-10-CM | POA: Diagnosis not present

## 2017-05-28 DIAGNOSIS — Z961 Presence of intraocular lens: Secondary | ICD-10-CM | POA: Diagnosis not present

## 2017-07-10 ENCOUNTER — Other Ambulatory Visit: Payer: Self-pay | Admitting: Nurse Practitioner

## 2017-07-20 ENCOUNTER — Other Ambulatory Visit: Payer: Self-pay | Admitting: Nurse Practitioner

## 2017-07-20 DIAGNOSIS — Z1231 Encounter for screening mammogram for malignant neoplasm of breast: Secondary | ICD-10-CM

## 2017-08-11 ENCOUNTER — Ambulatory Visit
Admission: RE | Admit: 2017-08-11 | Discharge: 2017-08-11 | Disposition: A | Discharge status: Home / Self Care | Payer: Medicare Other | Source: Ambulatory Visit | Attending: Nurse Practitioner | Admitting: Nurse Practitioner

## 2017-08-11 DIAGNOSIS — Z1231 Encounter for screening mammogram for malignant neoplasm of breast: Secondary | ICD-10-CM

## 2017-08-20 ENCOUNTER — Encounter: Payer: Self-pay | Admitting: Nurse Practitioner

## 2017-08-20 ENCOUNTER — Ambulatory Visit (INDEPENDENT_AMBULATORY_CARE_PROVIDER_SITE_OTHER): Payer: Medicare Other | Admitting: Nurse Practitioner

## 2017-08-20 VITALS — BP 134/86 | HR 81 | Temp 98.0°F | Resp 16 | Ht 62.0 in | Wt 154.0 lb

## 2017-08-20 DIAGNOSIS — E782 Mixed hyperlipidemia: Secondary | ICD-10-CM

## 2017-08-20 DIAGNOSIS — I10 Essential (primary) hypertension: Secondary | ICD-10-CM | POA: Diagnosis not present

## 2017-08-20 DIAGNOSIS — R739 Hyperglycemia, unspecified: Secondary | ICD-10-CM | POA: Diagnosis not present

## 2017-08-20 DIAGNOSIS — Z23 Encounter for immunization: Secondary | ICD-10-CM | POA: Diagnosis not present

## 2017-08-20 DIAGNOSIS — M8580 Other specified disorders of bone density and structure, unspecified site: Secondary | ICD-10-CM

## 2017-08-20 DIAGNOSIS — Z720 Tobacco use: Secondary | ICD-10-CM | POA: Diagnosis not present

## 2017-08-20 LAB — COMPLETE METABOLIC PANEL WITH GFR
AG RATIO: 1.3 (calc) (ref 1.0–2.5)
ALKALINE PHOSPHATASE (APISO): 58 U/L (ref 33–130)
ALT: 22 U/L (ref 6–29)
AST: 27 U/L (ref 10–35)
Albumin: 4 g/dL (ref 3.6–5.1)
BILIRUBIN TOTAL: 0.4 mg/dL (ref 0.2–1.2)
BUN/Creatinine Ratio: 9 (calc) (ref 6–22)
BUN: 10 mg/dL (ref 7–25)
CHLORIDE: 104 mmol/L (ref 98–110)
CO2: 26 mmol/L (ref 20–32)
Calcium: 10 mg/dL (ref 8.6–10.4)
Creat: 1.08 mg/dL — ABNORMAL HIGH (ref 0.50–0.99)
GFR, EST NON AFRICAN AMERICAN: 54 mL/min/{1.73_m2} — AB (ref >=60)
GFR, Est African American: 62 mL/min/{1.73_m2} (ref >=60)
GLOBULIN: 3.1 g/dL (ref 1.9–3.7)
Glucose, Bld: 99 mg/dL (ref 65–139)
POTASSIUM: 3.7 mmol/L (ref 3.5–5.3)
SODIUM: 139 mmol/L (ref 135–146)
Total Protein: 7.1 g/dL (ref 6.1–8.1)

## 2017-08-20 MED ORDER — ATENOLOL 50 MG PO TABS: 50.0000 mg | ORAL_TABLET | Freq: Every day | ORAL | 1 refills | Status: DC

## 2017-08-20 NOTE — Patient Instructions (Addendum)
Follow up in 6 months- lab work before visit    DASH Eating Plan DASH stands for "Dietary Approaches to Stop Hypertension." The DASH eating plan is a healthy eating plan that has been shown to reduce high blood pressure (hypertension). It may also reduce your risk for type 2 diabetes, heart disease, and stroke. The DASH eating plan may also help with weight loss. What are tips for following this plan? General guidelines  Avoid eating more than 2,300 mg (milligrams) of salt (sodium) a day. If you have hypertension, you may need to reduce your sodium intake to 1,500 mg a day.  Limit alcohol intake to no more than 1 drink a day for nonpregnant women and 2 drinks a day for men. One drink equals 12 oz of beer, 5 oz of wine, or 1 oz of hard liquor.  Work with your health care provider to maintain a healthy body weight or to lose weight. Ask what an ideal weight is for you.  Get at least 30 minutes of exercise that causes your heart to beat faster (aerobic exercise) most days of the week. Activities may include walking, swimming, or biking.  Work with your health care provider or diet and nutrition specialist (dietitian) to adjust your eating plan to your individual calorie needs. Reading food labels  Check food labels for the amount of sodium per serving. Choose foods with less than 5 percent of the Daily Value of sodium. Generally, foods with less than 300 mg of sodium per serving fit into this eating plan.  To find whole grains, look for the word "whole" as the first word in the ingredient list. Shopping  Buy products labeled as "low-sodium" or "no salt added."  Buy fresh foods. Avoid canned foods and premade or frozen meals. Cooking  Avoid adding salt when cooking. Use salt-free seasonings or herbs instead of table salt or sea salt. Check with your health care provider or pharmacist before using salt substitutes.  Do not fry foods. Cook foods using healthy methods such as baking,  boiling, grilling, and broiling instead.  Cook with heart-healthy oils, such as olive, canola, soybean, or sunflower oil. Meal planning   Eat a balanced diet that includes: ? 5 or more servings of fruits and vegetables each day. At each meal, try to fill half of your plate with fruits and vegetables. ? Up to 6-8 servings of whole grains each day. ? Less than 6 oz of lean meat, poultry, or fish each day. A 3-oz serving of meat is about the same size as a deck of cards. One egg equals 1 oz. ? 2 servings of low-fat dairy each day. ? A serving of nuts, seeds, or beans 5 times each week. ? Heart-healthy fats. Healthy fats called Omega-3 fatty acids are found in foods such as flaxseeds and coldwater fish, like sardines, salmon, and mackerel.  Limit how much you eat of the following: ? Canned or prepackaged foods. ? Food that is high in trans fat, such as fried foods. ? Food that is high in saturated fat, such as fatty meat. ? Sweets, desserts, sugary drinks, and other foods with added sugar. ? Full-fat dairy products.  Do not salt foods before eating.  Try to eat at least 2 vegetarian meals each week.  Eat more home-cooked food and less restaurant, buffet, and fast food.  When eating at a restaurant, ask that your food be prepared with less salt or no salt, if possible. What foods are recommended? The items listed  may not be a complete list. Talk with your dietitian about what dietary choices are best for you. Grains Whole-grain or whole-wheat bread. Whole-grain or whole-wheat pasta. Brown rice. [place] Morrow. Bulgur. Whole-grain and low-sodium cereals. Pita bread. Low-fat, low-sodium crackers. Whole-wheat flour tortillas. Vegetables Fresh or frozen vegetables (raw, steamed, roasted, or grilled). Low-sodium or reduced-sodium tomato and vegetable juice. Low-sodium or reduced-sodium tomato sauce and tomato paste. Low-sodium or reduced-sodium canned vegetables. Fruits All fresh, dried, or  frozen fruit. Canned fruit in natural juice (without added sugar). Meat and other protein foods Skinless chicken or [place]. Ground chicken or [place]. Pork with fat trimmed off. Fish and seafood. Egg whites. Dried beans, peas, or lentils. Unsalted nuts, nut butters, and seeds. Unsalted canned beans. Lean cuts of beef with fat trimmed off. Low-sodium, lean deli meat. Dairy Low-fat (1%) or fat-free (skim) milk. Fat-free, low-fat, or reduced-fat cheeses. Nonfat, low-sodium ricotta or cottage cheese. Low-fat or nonfat yogurt. Low-fat, low-sodium cheese. Fats and oils Soft margarine without trans fats. Vegetable oil. Low-fat, reduced-fat, or light mayonnaise and salad dressings (reduced-sodium). Canola, safflower, olive, soybean, and sunflower oils. Avocado. Seasoning and other foods Herbs. Spices. Seasoning mixes without salt. Unsalted popcorn and pretzels. Fat-free sweets. What foods are not recommended? The items listed may not be a complete list. Talk with your dietitian about what dietary choices are best for you. Grains Baked goods made with fat, such as croissants, muffins, or some breads. Dry pasta or rice meal packs. Vegetables Creamed or fried vegetables. Vegetables in a cheese sauce. Regular canned vegetables (not low-sodium or reduced-sodium). Regular canned tomato sauce and paste (not low-sodium or reduced-sodium). Regular tomato and vegetable juice (not low-sodium or reduced-sodium). Angie Fava. Olives. Fruits Canned fruit in a light or heavy syrup. Fried fruit. Fruit in cream or butter sauce. Meat and other protein foods Fatty cuts of meat. Ribs. Fried meat. Berniece Salines. Sausage. Bologna and other processed lunch meats. Salami. Fatback. Hotdogs. Bratwurst. Salted nuts and seeds. Canned beans with added salt. Canned or smoked fish. Whole eggs or egg yolks. Chicken or [place] with skin. Dairy Whole or 2% milk, cream, and half-and-half. Whole or full-fat cream cheese. Whole-fat or sweetened yogurt.  Full-fat cheese. Nondairy creamers. Whipped toppings. Processed cheese and cheese spreads. Fats and oils Butter. Stick margarine. Lard. Shortening. Ghee. Bacon fat. Tropical oils, such as coconut, palm kernel, or palm oil. Seasoning and other foods Salted popcorn and pretzels. Onion salt, garlic salt, seasoned salt, table salt, and sea salt. Worcestershire sauce. Tartar sauce. Barbecue sauce. Teriyaki sauce. Soy sauce, including reduced-sodium. Steak sauce. Canned and packaged gravies. Fish sauce. Oyster sauce. Cocktail sauce. Horseradish that you find on the shelf. Ketchup. Mustard. Meat flavorings and tenderizers. Bouillon cubes. Hot sauce and Tabasco sauce. Premade or packaged marinades. Premade or packaged taco seasonings. Relishes. Regular salad dressings. Where to find more information:  National Heart, Lung, and [place]: https://wilson-eaton.com/  American Heart Association: www.heart.org Summary  The DASH eating plan is a healthy eating plan that has been shown to reduce high blood pressure (hypertension). It may also reduce your risk for type 2 diabetes, heart disease, and stroke.  With the DASH eating plan, you should limit salt (sodium) intake to 2,300 mg a day. If you have hypertension, you may need to reduce your sodium intake to 1,500 mg a day.  When on the DASH eating plan, aim to eat more fresh fruits and vegetables, whole grains, lean proteins, low-fat dairy, and heart-healthy fats.  Work with your health care provider or diet  and nutrition specialist (dietitian) to adjust your eating plan to your individual calorie needs. This information is not intended to replace advice given to you by your health care provider. Make sure you discuss any questions you have with your health care provider. Document Released: 08/28/2011 Document Revised: 09/01/2016 Document Reviewed: 09/01/2016 Elsevier Interactive Patient Education  2017 [employer].

## 2017-08-20 NOTE — Progress Notes (Signed)
Careteam: Patient Care Team: Betty Chandler, NP as PCP - General (Nurse Practitioner)  Advanced Directive information Does Patient Have a Medical Advance Directive?: Yes, Type of Advance Directive: Out of facility DNR (pink MOST or yellow form), Pre-existing out of facility DNR order (yellow form or pink MOST form): Yellow form placed in chart (order not valid for inpatient use)  No Known Allergies  Chief Complaint  Patient presents with  . Medical Management of Chronic Issues    Pt is being seen for a 6 month routine visit. Pt has no concerns today.   . Immunizations    Pt does want to get flu vaccine today.     HPI: Patient is a 66 y.o. female seen in the office today for routine follow up.  Pt with hx of HTN, tobacco abuse, hyperglycemia, Osteopenia, hyperlipidemia.   Hyperlipidemia- Lipitor, attempts heart healthy diet.   OP- currently on oscal with Vit D. Doing some weight bearing exercisers   Tobacco abuse- has cut back on smoking, she reports people she lives with smoke so it stays on her clothes. Started smoking when she was 78 or 24, quit for about 8 years and then resumed. Smoking an average 2-3 cigarettes a day but exposed to 2nd hand smoke for at least 10 years.   HTN- taking atenolol   Schizophrenia- using thioridazine qhs- mood has been going, following with psych  Recently had dental work done, having teeth removed.  Review of Systems:  Review of Systems  Constitutional: Negative for chills, fever and weight loss.  HENT: Negative for tinnitus.   Respiratory: Negative for cough, sputum production and shortness of breath.   Cardiovascular: Negative for chest pain, palpitations and leg swelling.  Gastrointestinal: Negative for abdominal pain, constipation, diarrhea and heartburn.  Genitourinary: Negative for dysuria, frequency and urgency.  Musculoskeletal: Negative for back pain, falls, joint pain and myalgias.  Skin: Negative.   Neurological: Negative  for dizziness and headaches.  Psychiatric/Behavioral: Negative for depression and memory loss. The patient does not have insomnia.     Past Medical History:  Diagnosis Date  . Chlamydia trachomatis infection of unspecified genitourinary site 12/18/2011  . Cough   . Disorder of bone and cartilage, unspecified   . Edema   . Hypercalcemia 02/14/2015  . Hyperlipemia   . Hyperparathyroidism   . Hypertension   . Osteopenia   . Other abnormal blood chemistry   . Schizophrenia ([place])    "little bit" (02/23/2015)  . Tachycardia, unspecified 05/29/2010  . Tobacco use disorder   . Unspecified glaucoma(365.9)   . Vitamin D deficiency    Past Surgical History:  Procedure Laterality Date  . BREAST EXCISIONAL BIOPSY Left   . NO PAST SURGERIES    . PARATHYROIDECTOMY N/A 03/15/2015   Procedure: PARATHYROIDECTOMY AND NECK EXPLORATION;  Surgeon: Armandina Gemma, MD;  Location: [place];  Service: General;  Laterality: N/A;   Social History:   reports that she has been smoking cigarettes.  She has a 5.64 pack-year smoking history. she has never used smokeless tobacco. She reports that she does not drink alcohol or use drugs.  Family History  Problem Relation Age of Onset  . Cancer Mother   . Cancer Brother        Throat  . Hypertension Other   . Diabetes Other     Medications:   Medication List        Accurate as of 08/20/17  1:25 PM. Always use your most recent med list.  atenolol 50 MG tablet Commonly known as:  TENORMIN Take 1 tablet (50 mg total) by mouth daily.   atorvastatin 20 MG tablet Commonly known as:  LIPITOR TAKE 1 TABLET (20 MG TOTAL) BY MOUTH DAILY.   calcium carbonate 1500 (600 Ca) MG Tabs tablet Commonly known as:  OSCAL   dorzolamide 2 % ophthalmic solution Commonly known as:  TRUSOPT   LUMIGAN 0.01 % Soln Generic drug:  bimatoprost   magnesium gluconate 500 MG tablet Commonly known as:  MAGONATE Take 1 tablet (500 mg total) by mouth daily.     thioridazine 50 MG tablet Commonly known as:  MELLARIL TAKE 1 OR 2 TABLETS BY MOUTH AT BEDTIME   Vitamin D3 10000 units Tabs        Physical Exam:  Vitals:   08/20/17 1309  BP: 134/86  Pulse: 81  Resp: 16  Temp: 98 F (36.7 C)  TempSrc: Oral  SpO2: 99%  Weight: 154 lb (69.9 kg)  Height: 5' 2"  (1.575 m)   Body mass index is 28.17 kg/m.  Physical Exam  Constitutional: She is oriented to person, place, and time. She appears well-developed and well-nourished. No distress.  HENT:  Head: Normocephalic and atraumatic.  Mouth/Throat: Oropharynx is clear and moist. No oropharyngeal exudate.  Eyes: Conjunctivae are normal. Pupils are equal, round, and reactive to light.  Neck: Neck supple.  Cardiovascular: Normal rate, regular rhythm and normal heart sounds.  Pulmonary/Chest: Effort normal and breath sounds normal.  Abdominal: Soft. Bowel sounds are normal.  Neurological: She is alert and oriented to person, place, and time. She has normal reflexes.  Skin: Skin is warm and dry. She is not diaphoretic.  Psychiatric: She has a normal mood and affect.    Labs reviewed: Basic Metabolic Panel: Recent Labs    02/17/17 1148  NA 141  K 3.9  CL 106  CO2 23  GLUCOSE 109*  BUN 9  CREATININE 1.10*  CALCIUM 9.1   Liver Function Tests: Recent Labs    02/17/17 1148  AST 38*  ALT 32*  ALKPHOS 56  BILITOT 0.5  PROT 6.9  ALBUMIN 4.1   No results for input(s): LIPASE, AMYLASE in the last 8760 hours. No results for input(s): AMMONIA in the last 8760 hours. CBC: Recent Labs    02/17/17 1148  WBC 6.7  NEUTROABS 3,886  HGB 13.0  HCT 38.9  MCV 90.5  PLT 152   Lipid Panel: Recent Labs    02/17/17 1148  CHOL 146  HDL 66  LDLCALC 43  TRIG 184*  CHOLHDL 2.2   TSH: No results for input(s): TSH in the last 8760 hours. A1C: Lab Results  Component Value Date   HGBA1C 5.4 02/17/2017     Assessment/Plan 1. Tobacco use -also exposed to second hand smoke.  -  CT CHEST LUNG CA SCREEN LOW DOSE W/O CM; Future  2. Essential hypertension -stable on current regimen. Cont medication and diet.  - atenolol (TENORMIN) 50 MG tablet; Take 1 tablet (50 mg total) by mouth daily.  Dispense: 90 tablet; Refill: 1  3. Hyperglycemia -encouraged dietary changes. Will monitor   4. Mixed hyperlipidemia -conts on Lipitor, to cont dietary changes with medication - CMP with eGFR  5. Osteopenia, unspecified location -cont calcium with vit d, encouraged weight bearing exercises.   6. Need for immunization against influenza - Flu vaccine HIGH DOSE PF (Fluzone High dose)  Next appt: 6 months with lab prior to visit.  Carlos American. [place], [place]  [facility] [phone] 8 am - 5 pm) [phone] (after hours)

## 2017-08-25 DIAGNOSIS — H401132 Primary open-angle glaucoma, bilateral, moderate stage: Secondary | ICD-10-CM | POA: Diagnosis not present

## 2017-10-16 DIAGNOSIS — H401132 Primary open-angle glaucoma, bilateral, moderate stage: Secondary | ICD-10-CM | POA: Diagnosis not present

## 2017-10-27 ENCOUNTER — Encounter: Payer: Self-pay | Admitting: Nurse Practitioner

## 2017-11-18 DIAGNOSIS — H401132 Primary open-angle glaucoma, bilateral, moderate stage: Secondary | ICD-10-CM | POA: Diagnosis not present

## 2017-11-19 ENCOUNTER — Ambulatory Visit: Payer: Medicare Other

## 2017-11-27 ENCOUNTER — Ambulatory Visit: Payer: Medicare Other

## 2018-01-01 ENCOUNTER — Other Ambulatory Visit: Payer: Self-pay | Admitting: Nurse Practitioner

## 2018-01-13 DIAGNOSIS — H401132 Primary open-angle glaucoma, bilateral, moderate stage: Secondary | ICD-10-CM | POA: Diagnosis not present

## 2018-01-21 ENCOUNTER — Ambulatory Visit
Admission: RE | Admit: 2018-01-21 | Discharge: 2018-01-21 | Disposition: A | Discharge status: Home / Self Care | Payer: Medicare Other | Source: Ambulatory Visit | Attending: Nurse Practitioner | Admitting: Nurse Practitioner

## 2018-01-21 DIAGNOSIS — Z720 Tobacco use: Secondary | ICD-10-CM

## 2018-02-12 ENCOUNTER — Other Ambulatory Visit: Payer: Self-pay | Admitting: Nurse Practitioner

## 2018-02-12 DIAGNOSIS — I10 Essential (primary) hypertension: Secondary | ICD-10-CM

## 2018-02-16 ENCOUNTER — Telehealth: Payer: Self-pay | Admitting: Nurse Practitioner

## 2018-02-16 ENCOUNTER — Other Ambulatory Visit: Payer: Medicare Other

## 2018-02-16 NOTE — Telephone Encounter (Signed)
I called the pt to remind her of tomorrow's appt.  There was no answer and no option to leave a message. VDM (DD)

## 2018-02-17 ENCOUNTER — Ambulatory Visit (INDEPENDENT_AMBULATORY_CARE_PROVIDER_SITE_OTHER): Payer: Medicare Other

## 2018-02-17 ENCOUNTER — Ambulatory Visit (INDEPENDENT_AMBULATORY_CARE_PROVIDER_SITE_OTHER): Payer: Medicare Other | Admitting: Nurse Practitioner

## 2018-02-17 ENCOUNTER — Encounter: Payer: Self-pay | Admitting: Nurse Practitioner

## 2018-02-17 VITALS — BP 132/80 | HR 94 | Temp 98.2°F | Ht 62.0 in | Wt 143.0 lb

## 2018-02-17 DIAGNOSIS — E782 Mixed hyperlipidemia: Secondary | ICD-10-CM

## 2018-02-17 DIAGNOSIS — Z720 Tobacco use: Secondary | ICD-10-CM

## 2018-02-17 DIAGNOSIS — F209 Schizophrenia, unspecified: Secondary | ICD-10-CM

## 2018-02-17 DIAGNOSIS — R739 Hyperglycemia, unspecified: Secondary | ICD-10-CM | POA: Diagnosis not present

## 2018-02-17 DIAGNOSIS — I10 Essential (primary) hypertension: Secondary | ICD-10-CM | POA: Diagnosis not present

## 2018-02-17 DIAGNOSIS — Z0000 Encounter for general adult medical examination without abnormal findings: Secondary | ICD-10-CM

## 2018-02-17 DIAGNOSIS — M8580 Other specified disorders of bone density and structure, unspecified site: Secondary | ICD-10-CM | POA: Diagnosis not present

## 2018-02-17 DIAGNOSIS — Z23 Encounter for immunization: Secondary | ICD-10-CM | POA: Diagnosis not present

## 2018-02-17 MED ORDER — ZOSTER VAC RECOMB ADJUVANTED 50 MCG/0.5ML IM SUSR: 0.5000 mL | Freq: Once | INTRAMUSCULAR | 1 refills | Status: AC

## 2018-02-17 NOTE — Progress Notes (Signed)
Careteam: Patient Care Team: [name], NP as PCP - General (Nurse Practitioner)  Advanced Directive information Does Patient Have a Medical Advance Directive?: Yes, Type of Advance Directive: Out of facility DNR (pink MOST or yellow form)  No Known Allergies  Chief Complaint  Patient presents with  . Medical Management of Chronic Issues    Pt is being seen for a 6 month routine visit.      HPI: Patient is a 67 y.o. female seen in the office today for routine follow up.  Reports she is doing well.  Had a bad tooth that she is going to the dentist to get it pulled, has a dentist and planning to call today to make appt.   Did not get blood work yesterday because she ate. Planning to go to lab after visit.   Tobacco abuse- has cut back but still smoking at times.   htn- atenolol 50 mg by mouth daily for blood pressure.   Hyperlipidemia- lipitor 20 mg by mouth daily, attempts heart healthy diet.   Osteopenia- continues on calcium and vit D, does not exercise but plans to start.   Seeing a psychiatrist- Dr Arlana Hove and recently was changed from thioridazine to Quetiapine 50 mg 2 tablets daily at bedtime reports this helps her mood.     Review of Systems:  Review of Systems  Constitutional: Negative for chills, fever and weight loss.  HENT: Negative for tinnitus.   Respiratory: Negative for cough, sputum production and shortness of breath.   Cardiovascular: Negative for chest pain, palpitations and leg swelling.  Gastrointestinal: Negative for abdominal pain, constipation, diarrhea and heartburn.  Genitourinary: Negative for dysuria, frequency and urgency.  Musculoskeletal: Negative for back pain, falls, joint pain and myalgias.  Skin: Negative.   Neurological: Negative for dizziness and headaches.  Psychiatric/Behavioral: Negative for depression and memory loss. The patient does not have insomnia.     Past Medical History:  Diagnosis Date  . Chlamydia  trachomatis infection of unspecified genitourinary site 12/18/2011  . Cough   . Disorder of bone and cartilage, unspecified   . Edema   . Hypercalcemia 02/14/2015  . Hyperlipemia   . Hyperparathyroidism   . Hypertension   . Osteopenia   . Other abnormal blood chemistry   . Schizophrenia ([facility])    "little bit" (02/23/2015)  . Tachycardia, unspecified 05/29/2010  . Tobacco use disorder   . Unspecified glaucoma(365.9)   . Vitamin D deficiency    Past Surgical History:  Procedure Laterality Date  . BREAST EXCISIONAL BIOPSY Left   . NO PAST SURGERIES    . PARATHYROIDECTOMY N/A 03/15/2015   Procedure: PARATHYROIDECTOMY AND NECK EXPLORATION;  Surgeon: Darnell Level, MD;  Location: [facility];  Service: General;  Laterality: N/A;   Social History:   reports that she has been smoking cigarettes.  She has a 5.64 pack-year smoking history. She has never used smokeless tobacco. She reports that she does not drink alcohol or use drugs.  Family History  Problem Relation Age of Onset  . Cancer Mother   . Cancer Brother        Throat  . Hypertension Other   . Diabetes Other     Medications: Patient's Medications  New Prescriptions   No medications on file  Previous Medications   ATENOLOL (TENORMIN) 50 MG TABLET    TAKE 1 TABLET BY MOUTH EVERY DAY   ATORVASTATIN (LIPITOR) 20 MG TABLET    TAKE 1 TABLET (20 MG TOTAL) BY  MOUTH DAILY.   CALCIUM CARBONATE (OSCAL) 1500 (600 CA) MG TABS TABLET    Take 1,500 mg by mouth daily.   CHOLECALCIFEROL (VITAMIN D3) 10000 UNITS TABS    Take by mouth. Take 2 tablets by mouth once a day   DORZOLAMIDE (TRUSOPT) 2 % OPHTHALMIC SOLUTION    Place 1 drop into both eyes 3 (three) times daily.   LUMIGAN 0.01 % SOLN    Place 1 drop into both eyes at bedtime.   MAGNESIUM GLUCONATE (MAGONATE) 500 MG TABLET    Take 1 tablet (500 mg total) by mouth daily.   SIMBRINZA 1-0.2 % SUSP    Place 1 drop into both eyes 3 (three) times daily.  Modified Medications   Modified Medication  Previous Medication   ZOSTER VACCINE ADJUVANTED [facility]) INJECTION Zoster Vaccine Adjuvanted [facility]) injection      Inject 0.5 mLs into the muscle once for 1 dose.    Inject 0.5 mLs into the muscle once.  Discontinued Medications   THIORIDAZINE (MELLARIL) 50 MG TABLET    TAKE 1 OR 2 TABLETS BY MOUTH AT BEDTIME   ZOSTER VACCINE ADJUVANTED (SHINGRIX) INJECTION    Inject 0.5 mLs into the muscle once.     Physical Exam:  Vitals:   02/17/18 1015  BP: 132/80  Pulse: 94  Temp: 98.2 F (36.8 C)  TempSrc: Oral  SpO2: 98%  Weight: 143 lb (64.9 kg)  Height:  (1.575 m)   Body mass index is 26.16 kg/m.  Physical Exam  Constitutional: She is oriented to person, place, and time. She appears well-developed and well-nourished. No distress.  HENT:  Head: Normocephalic and atraumatic.  Mouth/Throat: Oropharynx is clear and moist. No oropharyngeal exudate.  Eyes: Pupils are equal, round, and reactive to light. Conjunctivae are normal.  Neck: Neck supple.  Cardiovascular: Normal rate, regular rhythm and normal heart sounds.  Pulmonary/Chest: Effort normal and breath sounds normal.  Abdominal: Soft. Bowel sounds are normal.  Neurological: She is alert and oriented to person, place, and time. She has normal reflexes.  Skin: Skin is warm and dry. She is not diaphoretic.  Psychiatric: She has a normal mood and affect.    Labs reviewed: Basic Metabolic Panel: Recent Labs    02/17/17 1148 08/20/17 1349  NA 141 139  K 3.9 3.7  CL 106 104  CO2 23 26  GLUCOSE 109* 99  BUN 9 10  CREATININE 1.10* 1.08*  CALCIUM 9.1 10.0   Liver Function Tests: Recent Labs    02/17/17 1148 08/20/17 1349  AST 38* 27  ALT 32* 22  ALKPHOS 56  --   BILITOT 0.5 0.4  PROT 6.9 7.1  ALBUMIN 4.1  --    No results for input(s): LIPASE, AMYLASE in the last 8760 hours. No results for input(s): AMMONIA in the last 8760 hours. CBC: Recent Labs    02/17/17 1148  WBC 6.7  NEUTROABS 3,886  HGB 13.0   HCT 38.9  MCV 90.5  PLT 152   Lipid Panel: Recent Labs    02/17/17 1148  CHOL 146  HDL 66  LDLCALC 43  TRIG 184*  CHOLHDL 2.2   TSH: No results for input(s): TSH in the last 8760 hours. A1C: Lab Results  Component Value Date   HGBA1C 5.4 02/17/2017     Assessment/Plan 1. Tobacco use Encouraged smoking cessation.   2. Hyperglycemia Will follow up A1c at this time, continue dietary modifications.   3. Essential hypertension Controlled on atenolol 50 mg daily  4. Mixed hyperlipidemia Will follow up lipids today, continues on lipitor 20 mg dialy   5. Schizophrenia, unspecified type [facility]) -following with psychiatrist, currently on Seroquel which has been beneficial   6. Osteopenia, unspecified location Continue cal with vit d, weight bearing exercises 30 mins/5 days a week encouraged.   Next appt: follow up with Dr Montez Morita in 6 months, 1 year with myself for EV Lavonne Cass K. Biagio Borg  [facility] [phone]

## 2018-02-17 NOTE — Patient Instructions (Addendum)
[name] , Thank you for taking time to come for your Medicare Wellness Visit. I appreciate your ongoing commitment to your health goals. Please review the following plan we discussed and let me know if I can assist you in the future.   Screening recommendations/referrals: Colonoscopy up to date, due 07/18/2024 Mammogram up to date, due 08/11/2018 Bone Density up to date Recommended yearly ophthalmology/optometry visit for glaucoma screening and checkup Recommended yearly dental visit for hygiene and checkup  Vaccinations: Influenza vaccine up to date, due 2019 fall season Pneumococcal vaccine up to date, completed Tdap vaccine up to date, due 12/17/2021 Shingles vaccine due, ordered to your pharmacy  Advanced directives: Advance directive discussed with you today. I have provided a copy for you to complete at home and have notarized. Once this is complete please bring a copy in to our office so we can scan it into your chart.  Conditions/risks identified: none  Next appointment: Betty Russell, RN 02/21/2019 @ 10am   Preventive Care 65 Years and Older, Female Preventive care refers to lifestyle choices and visits with your health care provider that can promote health and wellness. What does preventive care include?  A yearly physical exam. This is also called an annual well check.  Dental exams once or twice a year.  Routine eye exams. Ask your health care provider how often you should have your eyes checked.  Personal lifestyle choices, including:  Daily care of your teeth and gums.  Regular physical activity.  Eating a healthy diet.  Avoiding tobacco and drug use.  Limiting alcohol use.  Practicing safe sex.  Taking low-dose aspirin every day.  Taking vitamin and mineral supplements as recommended by your health care provider. What happens during an annual well check? The services and screenings done by your health care provider during your annual well check will  depend on your age, overall health, lifestyle risk factors, and family history of disease. Counseling  Your health care provider may ask you questions about your:  Alcohol use.  Tobacco use.  Drug use.  Emotional well-being.  Home and relationship well-being.  Sexual activity.  Eating habits.  History of falls.  Memory and ability to understand (cognition).  Work and work Astronomer.  Reproductive health. Screening  You may have the following tests or measurements:  Height, weight, and BMI.  Blood pressure.  Lipid and cholesterol levels. These may be checked every 5 years, or more frequently if you are over 97 years old.  Skin check.  Lung cancer screening. You may have this screening every year starting at age 61 if you have a 30-pack-year history of smoking and currently smoke or have quit within the past 15 years.  Fecal occult blood test (FOBT) of the stool. You may have this test every year starting at age 16.  Flexible sigmoidoscopy or colonoscopy. You may have a sigmoidoscopy every 5 years or a colonoscopy every 10 years starting at age 29.  Hepatitis C blood test.  Hepatitis B blood test.  Sexually transmitted disease (STD) testing.  Diabetes screening. This is done by checking your blood sugar (glucose) after you have not eaten for a while (fasting). You may have this done every 1-3 years.  Bone density scan. This is done to screen for osteoporosis. You may have this done starting at age 42.  Mammogram. This may be done every 1-2 years. Talk to your health care provider about how often you should have regular mammograms. Talk with your health care provider  about your test results, treatment options, and if necessary, the need for more tests. Vaccines  Your health care provider may recommend certain vaccines, such as:  Influenza vaccine. This is recommended every year.  Tetanus, diphtheria, and acellular pertussis (Tdap, Td) vaccine. You may need a  Td booster every 10 years.  Zoster vaccine. You may need this after age 3.  Pneumococcal 13-valent conjugate (PCV13) vaccine. One dose is recommended after age 41.  Pneumococcal polysaccharide (PPSV23) vaccine. One dose is recommended after age 63. Talk to your health care provider about which screenings and vaccines you need and how often you need them. This information is not intended to replace advice given to you by your health care provider. Make sure you discuss any questions you have with your health care provider. Document Released: 10/05/2015 Document Revised: 05/28/2016 Document Reviewed: 07/10/2015 Elsevier Interactive Patient Education  2017 [place] Prevention in the Home Falls can cause injuries. They can happen to people of all ages. There are many things you can do to make your home safe and to help prevent falls. What can I do on the outside of my home?  Regularly fix the edges of walkways and driveways and fix any cracks.  Remove anything that might make you trip as you walk through a door, such as a raised step or threshold.  Trim any bushes or trees on the path to your home.  Use bright outdoor lighting.  Clear any walking paths of anything that might make someone trip, such as rocks or tools.  Regularly check to see if handrails are loose or broken. Make sure that both sides of any steps have handrails.  Any raised decks and porches should have guardrails on the edges.  Have any leaves, snow, or ice cleared regularly.  Use sand or salt on walking paths during winter.  Clean up any spills in your garage right away. This includes oil or grease spills. What can I do in the bathroom?  Use night lights.  Install grab bars by the toilet and in the tub and shower. Do not use towel bars as grab bars.  Use non-skid mats or decals in the tub or shower.  If you need to sit down in the shower, use a plastic, non-slip stool.  Keep the floor dry. Clean  up any water that spills on the floor as soon as it happens.  Remove soap buildup in the tub or shower regularly.  Attach bath mats securely with double-sided non-slip rug tape.  Do not have throw rugs and other things on the floor that can make you trip. What can I do in the bedroom?  Use night lights.  Make sure that you have a light by your bed that is easy to reach.  Do not use any sheets or blankets that are too big for your bed. They should not hang down onto the floor.  Have a firm chair that has side arms. You can use this for support while you get dressed.  Do not have throw rugs and other things on the floor that can make you trip. What can I do in the kitchen?  Clean up any spills right away.  Avoid walking on wet floors.  Keep items that you use a lot in easy-to-reach places.  If you need to reach something above you, use a strong step stool that has a grab bar.  Keep electrical cords out of the way.  Do not use floor polish or  wax that makes floors slippery. If you must use wax, use non-skid floor wax.  Do not have throw rugs and other things on the floor that can make you trip. What can I do with my stairs?  Do not leave any items on the stairs.  Make sure that there are handrails on both sides of the stairs and use them. Fix handrails that are broken or loose. Make sure that handrails are as long as the stairways.  Check any carpeting to make sure that it is firmly attached to the stairs. Fix any carpet that is loose or worn.  Avoid having throw rugs at the top or bottom of the stairs. If you do have throw rugs, attach them to the floor with carpet tape.  Make sure that you have a light switch at the top of the stairs and the bottom of the stairs. If you do not have them, ask someone to add them for you. What else can I do to help prevent falls?  Wear shoes that:  Do not have high heels.  Have rubber bottoms.  Are comfortable and fit you well.  Are  closed at the toe. Do not wear sandals.  If you use a stepladder:  Make sure that it is fully opened. Do not climb a closed stepladder.  Make sure that both sides of the stepladder are locked into place.  Ask someone to hold it for you, if possible.  Clearly mark and make sure that you can see:  Any grab bars or handrails.  First and last steps.  Where the edge of each step is.  Use tools that help you move around (mobility aids) if they are needed. These include:  Canes.  Walkers.  Scooters.  Crutches.  Turn on the lights when you go into a dark area. Replace any light bulbs as soon as they burn out.  Set up your furniture so you have a clear path. Avoid moving your furniture around.  If any of your floors are uneven, fix them.  If there are any pets around you, be aware of where they are.  Review your medicines with your doctor. Some medicines can make you feel dizzy. This can increase your chance of falling. Ask your doctor what other things that you can do to help prevent falls. This information is not intended to replace advice given to you by your health care provider. Make sure you discuss any questions you have with your health care provider. Document Released: 07/05/2009 Document Revised: 02/14/2016 Document Reviewed: 10/13/2014 Elsevier Interactive Patient Education  2017 [employer].

## 2018-02-17 NOTE — Progress Notes (Signed)
Subjective:   [name] is a 67 y.o. female who presents for Medicare Annual (Subsequent) preventive examination.  Last AWV-02/11/2017    Objective:     Vitals: BP 132/80 (BP Location: Left Arm, Patient Position: Sitting)   Pulse 94   Temp 98.2 F (36.8 C) (Oral)   Ht  (1.575 m)   Wt 143 lb (64.9 kg)   SpO2 98%   BMI 26.16 kg/m   Body mass index is 26.16 kg/m.  Advanced Directives 02/17/2018 08/20/2017 02/17/2017 02/17/2017 02/11/2017 10/16/2016 07/15/2016  Does Patient Have a Medical Advance Directive? No Yes Yes No No Yes No  Type of Advance Directive - Out of facility DNR (pink MOST or yellow form) Out of facility DNR (pink MOST or yellow form) - - Living will -  Would patient like information on creating a medical advance directive? Yes (MAU/Ambulatory/Procedural Areas - Information given) - - Yes (MAU/Ambulatory/Procedural Areas - Information given) Yes (MAU/Ambulatory/Procedural Areas - Information given) - Yes - Educational materials given  Pre-existing out of facility DNR order (yellow form or pink MOST form) - Yellow form placed in chart (order not valid for inpatient use) Yellow form placed in chart (order not valid for inpatient use) - - - -    Tobacco Social History   Tobacco Use  Smoking Status Current Some Day Smoker  . Packs/day: 0.12  . Years: 47.00  . Pack years: 5.64  . Types: Cigarettes  Smokeless Tobacco Never Used  Tobacco Comment   Smokes every now and then, not everyday     Ready to quit: Not Answered Counseling given: Not Answered Comment: Smokes every now and then, not everyday   Clinical Intake:  Pre-visit preparation completed: No  Pain : No/denies pain     Nutritional Risks: None Diabetes: No  How often do you need to have someone help you when you read instructions, pamphlets, or other written materials from your doctor or pharmacy?: 2 - Rarely What is the last grade level you completed in school?: 2 years  college  Interpreter Needed?: No  Information entered by :: Tyron Russell, RN  Past Medical History:  Diagnosis Date  . Chlamydia trachomatis infection of unspecified genitourinary site 12/18/2011  . Cough   . Disorder of bone and cartilage, unspecified   . Edema   . Hypercalcemia 02/14/2015  . Hyperlipemia   . Hyperparathyroidism   . Hypertension   . Osteopenia   . Other abnormal blood chemistry   . Schizophrenia (HCC)    "little bit" (02/23/2015)  . Tachycardia, unspecified 05/29/2010  . Tobacco use disorder   . Unspecified glaucoma(365.9)   . Vitamin D deficiency    Past Surgical History:  Procedure Laterality Date  . BREAST EXCISIONAL BIOPSY Left   . NO PAST SURGERIES    . PARATHYROIDECTOMY N/A 03/15/2015   Procedure: PARATHYROIDECTOMY AND NECK EXPLORATION;  Surgeon: Darnell Level, MD;  Location: [facility] OR;  Service: General;  Laterality: N/A;   Family History  Problem Relation Age of Onset  . Cancer Mother   . Cancer Brother        Throat  . Hypertension Other   . Diabetes Other    Social History   Socioeconomic History  . Marital status: Married    Spouse name: Not on file  . Number of children: Not on file  . Years of education: Not on file  . Highest education level: Not on file  Occupational History  . Not on file  Social Needs  .  Financial resource strain: Not hard at all  . Food insecurity:    Worry: Never true    Inability: Never true  . Transportation needs:    Medical: No    Non-medical: No  Tobacco Use  . Smoking status: Current Some Day Smoker    Packs/day: 0.12    Years: 47.00    Pack years: 5.64    Types: Cigarettes  . Smokeless tobacco: Never Used  . Tobacco comment: Smokes every now and then, not everyday  Substance and Sexual Activity  . Alcohol use: No    Alcohol/week: 0.0 oz  . Drug use: No  . Sexual activity: Yes  Lifestyle  . Physical activity:    Days per week: 0 days    Minutes per session: 0 min  . Stress: Only a little   Relationships  . Social connections:    Talks on phone: Three times a week    Gets together: Once a week    Attends religious service: Never    Active member of club or organization: No    Attends meetings of clubs or organizations: Never    Relationship status: Married  Other Topics Concern  . Not on file  Social History Narrative  . Not on file    Outpatient Encounter Medications as of 02/17/2018  Medication Sig  . atenolol (TENORMIN) 50 MG tablet TAKE 1 TABLET BY MOUTH EVERY DAY  . atorvastatin (LIPITOR) 20 MG tablet TAKE 1 TABLET (20 MG TOTAL) BY MOUTH DAILY.  . calcium carbonate (OSCAL) 1500 (600 Ca) MG TABS tablet Take 1,500 mg by mouth daily.  . Cholecalciferol (VITAMIN D3) 10000 units TABS Take by mouth. Take 2 tablets by mouth once a day  . dorzolamide (TRUSOPT) 2 % ophthalmic solution Place 1 drop into both eyes 3 (three) times daily.  Marland Kitchen LUMIGAN 0.01 % SOLN Place 1 drop into both eyes at bedtime.  . magnesium gluconate (MAGONATE) 500 MG tablet Take 1 tablet (500 mg total) by mouth daily.  Marland Kitchen thioridazine (MELLARIL) 50 MG tablet TAKE 1 OR 2 TABLETS BY MOUTH AT BEDTIME  . Zoster Vaccine Adjuvanted [facility]) injection Inject 0.5 mLs into the muscle once.  Marland Kitchen SIMBRINZA 1-0.2 % SUSP Place 1 drop into both eyes 3 (three) times daily.   No facility-administered encounter medications on file as of 02/17/2018.     Activities of Daily Living In your present state of health, do you have any difficulty performing the following activities: 02/17/2018  Hearing? N  Vision? N  Difficulty concentrating or making decisions? N  Walking or climbing stairs? N  Dressing or bathing? N  Doing errands, shopping? N  Preparing Food and eating ? N  Using the Toilet? N  In the past six months, have you accidently leaked urine? N  Do you have problems with loss of bowel control? N  Managing your Medications? N  Managing your Finances? N  Housekeeping or managing your Housekeeping? N  Some  recent data might be hidden    Patient Care Team: [name], NP as PCP - General (Nurse Practitioner)    Assessment:   This is a routine wellness examination for [place].  Exercise Activities and Dietary recommendations Current Exercise Habits: The patient does not participate in regular exercise at present, Exercise limited by: None identified  Goals    . Patient Stated     Patient would like to start going to the gym twice a week with her brother  Fall Risk Fall Risk  02/17/2018 08/20/2017 02/17/2017 02/11/2017 10/16/2016  Falls in the past year? No No No No No  Number falls in past yr: - - - - -  Injury with Fall? - - - - -  Risk for fall due to : - - - - -   Is the patient's home free of loose throw rugs in walkways, pet beds, electrical cords, etc?   yes      Grab bars in the bathroom? no      Handrails on the stairs?   yes      Adequate lighting?   yes  Depression Screen PHQ 2/9 Scores 02/17/2018 02/17/2017 02/11/2017 01/02/2016  PHQ - 2 Score 0 0 0 0  PHQ- 9 Score - - - -     Cognitive Function MMSE - Mini Mental State Exam 02/17/2018 01/02/2016 03/15/2014  Not completed: - (No Data) -  Orientation to time Orientation to Place Registration Attention/ Calculation Recall Language- name 2 objects Language- repeat Language- follow 3 step command Language- read & follow direction Write a sentence Copy design 1 0 0  Total score Immunization History  Administered Date(s) Administered  . Influenza, High Dose Seasonal PF 08/20/2017  . Influenza,inj,Quad PF,6+ Mos 06/14/2013, 07/18/2014, 05/15/2016  . Pneumococcal Conjugate-13 03/15/2014, 02/17/2018  . Pneumococcal Polysaccharide-23 01/02/2016  . Td 09/23/1995  . Tdap 12/18/2011    Qualifies for Shingles Vaccine? Yes, educated and ordered to pharmacy  Screening Tests Health Maintenance  Topic Date Due  . Hepatitis  C Screening  04/21/2024 (Originally [birth year])  . HIV Screening  04/21/2024 (Originally 08/06/1966)  . INFLUENZA VACCINE  04/22/2018  . PAP SMEAR  01/02/2019  . MAMMOGRAM  08/12/2019  . PNA vac Low Risk Adult (2 of 2 - PPSV23) 01/01/2021  . TETANUS/TDAP  12/17/2021  . COLONOSCOPY  07/18/2024  . DEXA SCAN  Completed    Cancer Screenings: Lung: Low Dose CT Chest recommended if Age 30-80 years, 30 pack-year currently smoking OR have quit w/in 15years. Patient does qualify Breast:  Up to date on Mammogram? Yes   Up to date of Bone Density/Dexa? Yes Colorectal: up to date  Additional Screenings:  Hepatitis C Screening: declined Prevnar given today    Plan:    I have personally reviewed and addressed the Medicare Annual Wellness questionnaire and have noted the following in the patient's chart:  A. Medical and social history B. Use of alcohol, tobacco or illicit drugs  C. Current medications and supplements D. Functional ability and status E.  Nutritional status F.  Physical activity G. Advance directives H. List of other physicians I.  Hospitalizations, surgeries, and ER visits in previous 12 months J.  Vitals K. Screenings to include hearing, vision, cognitive, depression L. Referrals and appointments - none  In addition, I have reviewed and discussed with patient certain preventive protocols, quality metrics, and best practice recommendations. A written personalized care plan for preventive services as well as general preventive health recommendations were provided to patient.  See attached scanned questionnaire for additional information.   Signed,   Tyron Russell, RN Nurse Health Advisor  Patient Concerns: None

## 2018-02-18 LAB — COMPLETE METABOLIC PANEL WITH GFR
AG Ratio: 1.5 (calc) (ref 1.0–2.5)
ALBUMIN MSPROF: 4.4 g/dL (ref 3.6–5.1)
ALKALINE PHOSPHATASE (APISO): 64 U/L (ref 33–130)
ALT: 28 U/L (ref 6–29)
AST: 36 U/L — AB (ref 10–35)
BILIRUBIN TOTAL: 0.8 mg/dL (ref 0.2–1.2)
BUN: 8 mg/dL (ref 7–25)
CHLORIDE: 105 mmol/L (ref 98–110)
CO2: 26 mmol/L (ref 20–32)
CREATININE: 0.91 mg/dL (ref 0.50–0.99)
Calcium: 9.8 mg/dL (ref 8.6–10.4)
GFR, Est African American: 77 mL/min/{1.73_m2} (ref >=60)
GFR, Est Non African American: 66 mL/min/{1.73_m2} (ref >=60)
GLUCOSE: 100 mg/dL — AB (ref 65–99)
Globulin: 2.9 g/dL (calc) (ref 1.9–3.7)
Potassium: 3.5 mmol/L (ref 3.5–5.3)
Sodium: 145 mmol/L (ref 135–146)
Total Protein: 7.3 g/dL (ref 6.1–8.1)

## 2018-02-18 LAB — CBC WITH DIFFERENTIAL/PLATELET
BASOS ABS: 30 {cells}/uL (ref 0–200)
Basophils Relative: 0.4 %
Eosinophils Absolute: 7 cells/uL — ABNORMAL LOW (ref 15–500)
Eosinophils Relative: 0.1 %
HCT: 39.2 % (ref 35.0–45.0)
HEMOGLOBIN: 13.5 g/dL (ref 11.7–15.5)
Lymphs Abs: 2131 cells/uL (ref 850–3900)
MCH: 30.3 pg (ref 27.0–33.0)
MCHC: 34.4 g/dL (ref 32.0–36.0)
MCV: 88.1 fL (ref 80.0–100.0)
MONOS PCT: 8 %
MPV: 11 fL (ref 7.5–12.5)
NEUTROS PCT: 62.7 %
Neutro Abs: 4640 cells/uL (ref 1500–7800)
Platelets: 198 10*3/uL (ref 140–400)
RBC: 4.45 10*6/uL (ref 3.80–5.10)
RDW: 13.3 % (ref 11.0–15.0)
TOTAL LYMPHOCYTE: 28.8 %
WBC mixed population: 592 cells/uL (ref 200–950)
WBC: 7.4 10*3/uL (ref 3.8–10.8)

## 2018-02-18 LAB — HEMOGLOBIN A1C
Hgb A1c MFr Bld: 5.6 % of total Hgb (ref <5.7)
Mean Plasma Glucose: 114 (calc)
eAG (mmol/L): 6.3 (calc)

## 2018-02-18 LAB — LIPID PANEL
CHOL/HDL RATIO: 2.1 (calc) (ref <5.0)
Cholesterol: 151 mg/dL (ref <200)
HDL: 71 mg/dL (ref >50)
LDL CHOLESTEROL (CALC): 56 mg/dL
NON-HDL CHOLESTEROL (CALC): 80 mg/dL (ref <130)
TRIGLYCERIDES: 158 mg/dL — AB (ref <150)

## 2018-04-22 ENCOUNTER — Encounter: Payer: Self-pay | Admitting: Nurse Practitioner

## 2018-05-19 DIAGNOSIS — H401132 Primary open-angle glaucoma, bilateral, moderate stage: Secondary | ICD-10-CM | POA: Diagnosis not present

## 2018-06-26 ENCOUNTER — Other Ambulatory Visit: Payer: Self-pay | Admitting: Nurse Practitioner

## 2018-07-19 ENCOUNTER — Other Ambulatory Visit: Payer: Self-pay | Admitting: Nurse Practitioner

## 2018-07-19 DIAGNOSIS — Z1231 Encounter for screening mammogram for malignant neoplasm of breast: Secondary | ICD-10-CM

## 2018-08-09 ENCOUNTER — Other Ambulatory Visit: Payer: Self-pay | Admitting: Nurse Practitioner

## 2018-08-09 DIAGNOSIS — I10 Essential (primary) hypertension: Secondary | ICD-10-CM

## 2018-08-25 ENCOUNTER — Ambulatory Visit: Payer: Medicare Other | Admitting: Nurse Practitioner

## 2018-08-25 ENCOUNTER — Ambulatory Visit: Payer: Medicare Other | Admitting: Internal Medicine

## 2018-08-30 ENCOUNTER — Ambulatory Visit
Admission: RE | Admit: 2018-08-30 | Discharge: 2018-08-30 | Disposition: A | Discharge status: Home / Self Care | Payer: Medicare Other | Source: Ambulatory Visit | Attending: Nurse Practitioner | Admitting: Nurse Practitioner

## 2018-08-30 DIAGNOSIS — Z1231 Encounter for screening mammogram for malignant neoplasm of breast: Secondary | ICD-10-CM

## 2018-09-01 ENCOUNTER — Other Ambulatory Visit: Payer: Self-pay | Admitting: Nurse Practitioner

## 2018-09-01 DIAGNOSIS — R928 Other abnormal and inconclusive findings on diagnostic imaging of breast: Secondary | ICD-10-CM

## 2018-09-07 ENCOUNTER — Ambulatory Visit: Payer: Medicare Other | Admitting: Nurse Practitioner

## 2018-11-01 ENCOUNTER — Ambulatory Visit
Admission: RE | Admit: 2018-11-01 | Discharge: 2018-11-01 | Disposition: A | Discharge status: Home / Self Care | Payer: Medicare Other | Source: Ambulatory Visit | Attending: Nurse Practitioner | Admitting: Nurse Practitioner

## 2018-11-01 DIAGNOSIS — R928 Other abnormal and inconclusive findings on diagnostic imaging of breast: Secondary | ICD-10-CM

## 2018-12-30 ENCOUNTER — Other Ambulatory Visit: Payer: Self-pay | Admitting: Nurse Practitioner

## 2019-01-16 ENCOUNTER — Encounter: Payer: Self-pay | Admitting: Nurse Practitioner

## 2019-01-29 ENCOUNTER — Other Ambulatory Visit: Payer: Self-pay | Admitting: Nurse Practitioner

## 2019-01-29 DIAGNOSIS — I10 Essential (primary) hypertension: Secondary | ICD-10-CM

## 2019-02-21 ENCOUNTER — Ambulatory Visit: Payer: Medicare Other | Admitting: Nurse Practitioner

## 2019-02-21 ENCOUNTER — Ambulatory Visit: Payer: Self-pay

## 2019-02-21 ENCOUNTER — Encounter: Payer: Medicare Other | Admitting: Nurse Practitioner

## 2019-03-30 ENCOUNTER — Other Ambulatory Visit: Payer: Self-pay

## 2019-03-30 ENCOUNTER — Telehealth: Payer: Self-pay

## 2019-03-30 ENCOUNTER — Ambulatory Visit (INDEPENDENT_AMBULATORY_CARE_PROVIDER_SITE_OTHER): Payer: Medicare Other | Admitting: Nurse Practitioner

## 2019-03-30 ENCOUNTER — Encounter: Payer: Self-pay | Admitting: Nurse Practitioner

## 2019-03-30 VITALS — BP 140/78 | HR 100 | Temp 98.2°F | Ht 62.0 in | Wt 118.0 lb

## 2019-03-30 DIAGNOSIS — I10 Essential (primary) hypertension: Secondary | ICD-10-CM | POA: Diagnosis not present

## 2019-03-30 DIAGNOSIS — F209 Schizophrenia, unspecified: Secondary | ICD-10-CM

## 2019-03-30 DIAGNOSIS — R739 Hyperglycemia, unspecified: Secondary | ICD-10-CM

## 2019-03-30 DIAGNOSIS — E782 Mixed hyperlipidemia: Secondary | ICD-10-CM | POA: Diagnosis not present

## 2019-03-30 DIAGNOSIS — Z720 Tobacco use: Secondary | ICD-10-CM

## 2019-03-30 DIAGNOSIS — R000 Tachycardia, unspecified: Secondary | ICD-10-CM

## 2019-03-30 DIAGNOSIS — R634 Abnormal weight loss: Secondary | ICD-10-CM

## 2019-03-30 NOTE — Telephone Encounter (Signed)
Patient was in office today with her sister [place]. [place] spoke with [name], NP prior to patient being seen. [place] expressed her concern that patient off psych medication since February 2020 and appears manic.   Tandy Gaw and family members have tried to contact [facility] with no success and asked if there was something we could do. [name] advised that for psych patient or family will have to set up.  I tried to call [facility] at [phone] and after a 15 min wait for a representative I ended call.

## 2019-03-30 NOTE — Patient Instructions (Signed)
To make follow up with psych- [phone]  Address to [address], [place], [place] [address]  We will get lab work done today  Follow up in 6 weeks
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
## 2019-03-30 NOTE — Telephone Encounter (Signed)
Noted, information passed onto patient and sister

## 2019-03-31 LAB — COMPLETE METABOLIC PANEL WITH GFR
AG Ratio: 1.6 (calc) (ref 1.0–2.5)
ALT: 11 U/L (ref 6–29)
AST: 20 U/L (ref 10–35)
Albumin: 4.3 g/dL (ref 3.6–5.1)
Alkaline phosphatase (APISO): 48 U/L (ref 37–153)
BUN: 15 mg/dL (ref 7–25)
CO2: 27 mmol/L (ref 20–32)
Calcium: 10.2 mg/dL (ref 8.6–10.4)
Chloride: 103 mmol/L (ref 98–110)
Creat: 0.79 mg/dL (ref 0.50–0.99)
GFR, Est African American: 90 mL/min/{1.73_m2} (ref >=60)
GFR, Est Non African American: 78 mL/min/{1.73_m2} (ref >=60)
Globulin: 2.7 g/dL (calc) (ref 1.9–3.7)
Glucose, Bld: 82 mg/dL (ref 65–99)
Potassium: 3.4 mmol/L — ABNORMAL LOW (ref 3.5–5.3)
Sodium: 142 mmol/L (ref 135–146)
Total Bilirubin: 0.8 mg/dL (ref 0.2–1.2)
Total Protein: 7 g/dL (ref 6.1–8.1)

## 2019-03-31 LAB — CBC WITH DIFFERENTIAL/PLATELET
Absolute Monocytes: 552 cells/uL (ref 200–950)
Basophils Absolute: 31 cells/uL (ref 0–200)
Basophils Relative: 0.5 %
Eosinophils Absolute: 37 cells/uL (ref 15–500)
Eosinophils Relative: 0.6 %
HCT: 41.1 % (ref 35.0–45.0)
Hemoglobin: 13.7 g/dL (ref 11.7–15.5)
Lymphs Abs: 1649 cells/uL (ref 850–3900)
MCH: 29 pg (ref 27.0–33.0)
MCHC: 33.3 g/dL (ref 32.0–36.0)
MCV: 86.9 fL (ref 80.0–100.0)
MPV: 11.6 fL (ref 7.5–12.5)
Monocytes Relative: 8.9 %
Neutro Abs: 3931 cells/uL (ref 1500–7800)
Neutrophils Relative %: 63.4 %
Platelets: 213 10*3/uL (ref 140–400)
RBC: 4.73 10*6/uL (ref 3.80–5.10)
RDW: 12 % (ref 11.0–15.0)
Total Lymphocyte: 26.6 %
WBC: 6.2 10*3/uL (ref 3.8–10.8)

## 2019-03-31 LAB — HEMOGLOBIN A1C
Hgb A1c MFr Bld: 5.6 % of total Hgb (ref <5.7)
Mean Plasma Glucose: 114 (calc)
eAG (mmol/L): 6.3 (calc)

## 2019-03-31 LAB — T3, FREE: T3, Free: 2.8 pg/mL (ref 2.3–4.2)

## 2019-03-31 LAB — LIPID PANEL
Cholesterol: 143 mg/dL (ref <200)
HDL: 52 mg/dL (ref >=50)
LDL Cholesterol (Calc): 78 mg/dL (calc)
Non-HDL Cholesterol (Calc): 91 mg/dL (calc) (ref <130)
Total CHOL/HDL Ratio: 2.8 (calc) (ref <5.0)
Triglycerides: 54 mg/dL (ref <150)

## 2019-03-31 LAB — T4, FREE: Free T4: 1.1 ng/dL (ref 0.8–1.8)

## 2019-03-31 LAB — TSH: TSH: 2.16 mIU/L (ref 0.40–4.50)

## 2019-04-22 ENCOUNTER — Other Ambulatory Visit: Payer: Self-pay

## 2019-04-22 DIAGNOSIS — I10 Essential (primary) hypertension: Secondary | ICD-10-CM

## 2019-04-22 MED ORDER — ATENOLOL 50 MG PO TABS: 50.0000 mg | ORAL_TABLET | Freq: Every day | ORAL | 1 refills | Status: DC

## 2019-04-22 NOTE — Telephone Encounter (Signed)
Left message on voicemail informing patient request received and complete

## 2019-05-11 ENCOUNTER — Other Ambulatory Visit: Payer: Self-pay

## 2019-05-11 ENCOUNTER — Ambulatory Visit (INDEPENDENT_AMBULATORY_CARE_PROVIDER_SITE_OTHER): Payer: Medicare Other | Admitting: Nurse Practitioner

## 2019-05-11 ENCOUNTER — Encounter: Payer: Self-pay | Admitting: Nurse Practitioner

## 2019-05-11 VITALS — BP 142/86 | HR 105 | Temp 98.2°F | Ht 62.0 in | Wt 121.0 lb

## 2019-05-11 DIAGNOSIS — F209 Schizophrenia, unspecified: Secondary | ICD-10-CM

## 2019-05-11 DIAGNOSIS — E782 Mixed hyperlipidemia: Secondary | ICD-10-CM

## 2019-05-11 DIAGNOSIS — Z23 Encounter for immunization: Secondary | ICD-10-CM

## 2019-05-11 DIAGNOSIS — I10 Essential (primary) hypertension: Secondary | ICD-10-CM

## 2019-05-11 DIAGNOSIS — E2839 Other primary ovarian failure: Secondary | ICD-10-CM

## 2019-05-11 MED ORDER — ATORVASTATIN CALCIUM 20 MG PO TABS: ORAL_TABLET | ORAL | 1 refills | Status: DC

## 2019-05-11 MED ORDER — ATENOLOL 50 MG PO TABS: 50.0000 mg | ORAL_TABLET | Freq: Every day | ORAL | 1 refills | Status: DC

## 2019-05-11 NOTE — Progress Notes (Signed)
Careteam: Patient Care Team: [name], NP as PCP - General (Nurse Practitioner)  Advanced Directive information    No Known Allergies  Chief Complaint  Patient presents with  . Follow-up    6 week follow-up. Here with sister  . Immunizations    Flu vaccine today      HPI: Patient is a 68 y.o. female seen in the office today for follow up.  After she left here 6 weeks ago they went down to [facility] and got her an appt. She was restarted back on her psych medication.   Bipolar- restarted taking Seroquel but unsure of dose. Reports she is now sleeping at night. Mood has improved  Eating better and taking ensures.   Has not restarted atenolol, stated it was not at pharmacy when she went last night   Smoking- ongoing, but does not smoke much. Has not had CT because they were unable to get in touch with her- she does not have a working phone number at this time.    Review of Systems:  Review of Systems  Constitutional: Positive for weight loss (up 3 lbs since last visit). Negative for chills and fever.  Respiratory: Negative for cough, sputum production and shortness of breath.   Cardiovascular: Negative for chest pain, palpitations and leg swelling.  Gastrointestinal: Negative for abdominal pain, constipation, diarrhea and heartburn.  Genitourinary: Negative for dysuria, frequency and urgency.  Musculoskeletal: Negative for back pain, falls, joint pain and myalgias.  Skin: Negative.   Neurological: Negative for dizziness and headaches.  Psychiatric/Behavioral: Negative for depression and memory loss. The patient is not nervous/anxious and does not have insomnia.     Past Medical History:  Diagnosis Date  . Chlamydia trachomatis infection of unspecified genitourinary site 12/18/2011  . Cough   . Disorder of bone and cartilage, unspecified   . Edema   . Hypercalcemia 02/14/2015  . Hyperlipemia   . Hyperparathyroidism   . Hypertension   . Osteopenia   .  Other abnormal blood chemistry   . Schizophrenia ([place])    "little bit" (02/23/2015)  . Tachycardia, unspecified 05/29/2010  . Tobacco use disorder   . Unspecified glaucoma(365.9)   . Vitamin D deficiency    Past Surgical History:  Procedure Laterality Date  . BREAST EXCISIONAL BIOPSY Left   . NO PAST SURGERIES    . PARATHYROIDECTOMY N/A 03/15/2015   Procedure: PARATHYROIDECTOMY AND NECK EXPLORATION;  Surgeon: Armandina Gemma, MD;  Location: [facility];  Service: General;  Laterality: N/A;   Social History:   reports that she has been smoking cigarettes. She has a 5.64 pack-year smoking history. She has never used smokeless tobacco. She reports that she does not drink alcohol or use drugs.  Family History  Problem Relation Age of Onset  . Cancer Mother   . Cancer Brother        Throat  . Hypertension Other   . Diabetes Other     Medications: Patient's Medications  New Prescriptions   No medications on file  Previous Medications   ATENOLOL (TENORMIN) 50 MG TABLET    Take 1 tablet (50 mg total) by mouth daily.   ATORVASTATIN (LIPITOR) 20 MG TABLET    TAKE 1 TABLET (20 MG TOTAL) BY MOUTH DAILY.   CALCIUM CARBONATE (OSCAL) 1500 (600 CA) MG TABS TABLET    Take 1,500 mg by mouth daily.   CHOLECALCIFEROL (VITAMIN D3) 10000 UNITS TABS    Take by mouth. Take 2 tablets by mouth once  a day   LUMIGAN 0.01 % SOLN    Place 1 drop into both eyes at bedtime.   MAGNESIUM GLUCONATE (MAGONATE) 500 MG TABLET    Take 1 tablet (500 mg total) by mouth daily.   SIMBRINZA 1-0.2 % SUSP    Place 1 drop into both eyes 3 (three) times daily.  Modified Medications   No medications on file  Discontinued Medications   No medications on file    Physical Exam:  Vitals:   05/11/19 1549  BP: (!) 142/86  Pulse: (!) 105  Temp: 98.2 F (36.8 C)  TempSrc: Oral  SpO2: 96%  Weight: 121 lb (54.9 kg)  Height: 5\' 2"  (1.575 m)   Body mass index is 22.13 kg/m. Wt Readings from Last 3 Encounters:  05/11/19 121 lb  (54.9 kg)  03/30/19 118 lb (53.5 kg)  02/17/18 143 lb (64.9 kg)    Physical Exam Constitutional:      General: She is not in acute distress.    Appearance: She is well-developed. She is not diaphoretic.  HENT:     Head: Normocephalic and atraumatic.     Mouth/Throat:     Pharynx: No oropharyngeal exudate.  Eyes:     Conjunctiva/sclera: Conjunctivae normal.     Pupils: Pupils are equal, round, and reactive to light.  Neck:     Musculoskeletal: Neck supple.  Cardiovascular:     Rate and Rhythm: Normal rate and regular rhythm.     Heart sounds: Normal heart sounds.  Pulmonary:     Effort: Pulmonary effort is normal.     Breath sounds: Normal breath sounds.  Abdominal:     General: Bowel sounds are normal.     Palpations: Abdomen is soft.  Skin:    General: Skin is warm and dry.  Neurological:     Mental Status: She is alert and oriented to person, place, and time.     Deep Tendon Reflexes: Reflexes are normal and symmetric.  Psychiatric:        Mood and Affect: Mood normal.        Thought Content: Thought content normal.     Labs reviewed: Basic Metabolic Panel: Recent Labs    03/30/19 1548  NA 142  K 3.4*  CL 103  CO2 27  GLUCOSE 82  BUN 15  CREATININE 0.79  CALCIUM 10.2  TSH 2.16   Liver Function Tests: Recent Labs    03/30/19 1548  AST 20  ALT 11  BILITOT 0.8  PROT 7.0   No results for input(s): LIPASE, AMYLASE in the last 8760 hours. No results for input(s): AMMONIA in the last 8760 hours. CBC: Recent Labs    03/30/19 1548  WBC 6.2  NEUTROABS 3,931  HGB 13.7  HCT 41.1  MCV 86.9  PLT 213   Lipid Panel: Recent Labs    03/30/19 1548  CHOL 143  HDL 52  LDLCALC 78  TRIG 54  CHOLHDL 2.8   TSH: Recent Labs    03/30/19 1548  TSH 2.16   A1C: Lab Results  Component Value Date   HGBA1C 5.6 03/30/2019     Assessment/Plan 1. Need for influenza vaccination - Flu Vaccine QUAD High Dose(Fluad)  2. Essential hypertension -elevated  today with ongoing tachycardia, is NOT taking atenolol. RX sent to the pharmacy again, instructed to call office if she has a problem getting it. Dietary modifications encouraged, she is eating a lot of food high in sodium.  - atenolol (TENORMIN) 50 MG tablet;  Take 1 tablet (50 mg total) by mouth daily.  Dispense: 90 tablet; Refill: 1  3. Mixed hyperlipidemia - atorvastatin (LIPITOR) 20 MG tablet; TAKE 1 TABLET (20 MG TOTAL) BY MOUTH DAILY.  Dispense: 90 tablet; Refill: 1  4. Schizophrenia, unspecified type (HCC) Improved mood and sleep now back on medication, will bring bottles in on follow up for us to review and add to medication list.   5. Estrogen deficiency - DG Bone Density; Future  Next appt: 2 weeks for blood pressure and HR check  Haylynn Pha K. Biagio BorgEubanks, AGNP  [facility] [phone]

## 2019-05-11 NOTE — Patient Instructions (Addendum)
Please call [phone] to schedule your CHEST CT- they can not get in touch with you  Call [facility] to schedule Bone Density  Address: [address], [place], [place] [address] Phone: [phone]  Make sure to pick up atenolol at the drug store.

## 2019-05-25 ENCOUNTER — Ambulatory Visit: Payer: Medicare Other | Admitting: Nurse Practitioner

## 2019-07-09 ENCOUNTER — Emergency Department (HOSPITAL_COMMUNITY)
Admission: EM | Admit: 2019-07-09 | Discharge: 2019-07-10 | Disposition: A | Discharge status: Home / Self Care | Payer: Medicare Other | Attending: Emergency Medicine | Admitting: Emergency Medicine

## 2019-07-09 ENCOUNTER — Emergency Department (HOSPITAL_COMMUNITY): Payer: Medicare Other

## 2019-07-09 ENCOUNTER — Encounter (HOSPITAL_COMMUNITY): Payer: Self-pay | Admitting: Emergency Medicine

## 2019-07-09 ENCOUNTER — Other Ambulatory Visit: Payer: Self-pay

## 2019-07-09 DIAGNOSIS — Z23 Encounter for immunization: Secondary | ICD-10-CM | POA: Insufficient documentation

## 2019-07-09 DIAGNOSIS — Z79899 Other long term (current) drug therapy: Secondary | ICD-10-CM | POA: Insufficient documentation

## 2019-07-09 DIAGNOSIS — Y9389 Activity, other specified: Secondary | ICD-10-CM | POA: Diagnosis not present

## 2019-07-09 DIAGNOSIS — I10 Essential (primary) hypertension: Secondary | ICD-10-CM | POA: Insufficient documentation

## 2019-07-09 DIAGNOSIS — Y92414 Local residential or business street as the place of occurrence of the external cause: Secondary | ICD-10-CM | POA: Insufficient documentation

## 2019-07-09 DIAGNOSIS — F17210 Nicotine dependence, cigarettes, uncomplicated: Secondary | ICD-10-CM | POA: Insufficient documentation

## 2019-07-09 DIAGNOSIS — S0990XA Unspecified injury of head, initial encounter: Secondary | ICD-10-CM | POA: Diagnosis present

## 2019-07-09 DIAGNOSIS — R0902 Hypoxemia: Secondary | ICD-10-CM | POA: Diagnosis not present

## 2019-07-09 DIAGNOSIS — M542 Cervicalgia: Secondary | ICD-10-CM | POA: Diagnosis not present

## 2019-07-09 DIAGNOSIS — S0121XA Laceration without foreign body of nose, initial encounter: Secondary | ICD-10-CM | POA: Insufficient documentation

## 2019-07-09 DIAGNOSIS — M25561 Pain in right knee: Secondary | ICD-10-CM | POA: Diagnosis not present

## 2019-07-09 DIAGNOSIS — Y998 Other external cause status: Secondary | ICD-10-CM | POA: Insufficient documentation

## 2019-07-09 DIAGNOSIS — R58 Hemorrhage, not elsewhere classified: Secondary | ICD-10-CM | POA: Diagnosis not present

## 2019-07-09 DIAGNOSIS — S199XXA Unspecified injury of neck, initial encounter: Secondary | ICD-10-CM | POA: Diagnosis not present

## 2019-07-09 MED ORDER — LIDOCAINE HCL (PF) 1 % IJ SOLN
10.0000 mL | Freq: Once | INTRAMUSCULAR | Status: DC
  Filled 2019-07-09: qty 30

## 2019-07-09 MED ORDER — TETANUS-DIPHTH-ACELL PERTUSSIS 5-2.5-18.5 LF-MCG/0.5 IM SUSP
0.5000 mL | Freq: Once | INTRAMUSCULAR | Status: AC
  Administered 2019-07-10: 0.5 mL via INTRAMUSCULAR
  Filled 2019-07-09: qty 0.5

## 2019-07-09 MED ORDER — ACETAMINOPHEN 500 MG PO TABS
1000.0000 mg | ORAL_TABLET | Freq: Once | ORAL | Status: AC
  Administered 2019-07-10: 1000 mg via ORAL
  Filled 2019-07-09: qty 2
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
## 2019-07-09 NOTE — ED Notes (Signed)
Family at bedside. 
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
## 2019-07-09 NOTE — ED Notes (Signed)
Pt states her nose is very sore and she is starting to get a headache.

## 2019-07-10 DIAGNOSIS — M542 Cervicalgia: Secondary | ICD-10-CM | POA: Diagnosis not present

## 2019-07-10 DIAGNOSIS — S199XXA Unspecified injury of neck, initial encounter: Secondary | ICD-10-CM | POA: Diagnosis not present

## 2019-07-10 DIAGNOSIS — S0121XA Laceration without foreign body of nose, initial encounter: Secondary | ICD-10-CM | POA: Diagnosis not present

## 2019-07-10 DIAGNOSIS — S0990XA Unspecified injury of head, initial encounter: Secondary | ICD-10-CM | POA: Diagnosis not present

## 2019-07-10 NOTE — Discharge Instructions (Addendum)
Thank you for allowing me to care for you today in the Emergency Department.   Your stitches will dissolve on their own and should follow-up within the next week.  Keep the wound on your nose clean and dry for the first 48 hours.  After that time, you can gently clean the area with warm water and soap.  Clean the wound on your right knee with warm water and soap at least once daily.  You can apply a topical antibiotic such as bacitracin or Neosporin to the area and then cover this area with a bandage.  Change the bandage at least once daily or anytime he gets soiled.  You can take 650 mg of Tylenol once every 6 hours for pain.  Do not take Tylenol if you are drinking alcohol as this is harmful to your liver.  You can apply an ice pack for 15 to 20 minutes to areas that are sore as frequently as needed.  You should have your wound reevaluated if you develop fever, chills, if you start to have thick, mucus-like drainage from the area, or if the wound gets red and hot to the touch.
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
## 2019-08-17 ENCOUNTER — Other Ambulatory Visit: Payer: Self-pay

## 2019-08-17 NOTE — Patient Outreach (Signed)
[facility]) Care Management  08/17/2019  [name] April 08, 1951 [id number]   Medication Adherence call to [name] HIPPA Compliant Voice message left with a call back number. [name] is showing past due on Atorvastatin 20 mg under [place].  [place] Management Direct Dial [phone]  Fax [phone] [name]

## 2019-08-25 ENCOUNTER — Other Ambulatory Visit: Payer: Self-pay

## 2019-08-25 NOTE — Patient Outreach (Signed)
[facility]) Care Management  08/25/2019  WHITNEY HILLEGASS [birth year] [id number]   Medication Adherence call to Mrs. Doree Albee HIPPA Compliant Voice message left with a call back number. Mrs. Fort is showing past due on Atorvastatin 20 mg under [place].   [place] Management Direct Dial [phone]  Fax [phone] [email or web address]

## 2019-10-07 ENCOUNTER — Ambulatory Visit: Payer: Self-pay | Admitting: Family

## 2019-10-07 ENCOUNTER — Encounter (HOSPITAL_COMMUNITY): Payer: Self-pay | Admitting: Emergency Medicine

## 2019-10-07 ENCOUNTER — Emergency Department (HOSPITAL_COMMUNITY)
Admission: EM | Admit: 2019-10-07 | Discharge: 2019-10-08 | Disposition: A | Discharge status: Other Acute Inpatient Hospital | Payer: Medicare Other | Attending: Emergency Medicine | Admitting: Emergency Medicine

## 2019-10-07 ENCOUNTER — Other Ambulatory Visit: Payer: Self-pay

## 2019-10-07 DIAGNOSIS — Z20822 Contact with and (suspected) exposure to covid-19: Secondary | ICD-10-CM | POA: Insufficient documentation

## 2019-10-07 DIAGNOSIS — F17210 Nicotine dependence, cigarettes, uncomplicated: Secondary | ICD-10-CM | POA: Diagnosis not present

## 2019-10-07 DIAGNOSIS — Z03818 Encounter for observation for suspected exposure to other biological agents ruled out: Secondary | ICD-10-CM | POA: Diagnosis not present

## 2019-10-07 DIAGNOSIS — Z79899 Other long term (current) drug therapy: Secondary | ICD-10-CM | POA: Insufficient documentation

## 2019-10-07 DIAGNOSIS — F209 Schizophrenia, unspecified: Secondary | ICD-10-CM | POA: Insufficient documentation

## 2019-10-07 DIAGNOSIS — I10 Essential (primary) hypertension: Secondary | ICD-10-CM | POA: Diagnosis not present

## 2019-10-07 DIAGNOSIS — F29 Unspecified psychosis not due to a substance or known physiological condition: Secondary | ICD-10-CM | POA: Diagnosis not present

## 2019-10-07 DIAGNOSIS — R4182 Altered mental status, unspecified: Secondary | ICD-10-CM | POA: Diagnosis present

## 2019-10-07 LAB — URINALYSIS, ROUTINE W REFLEX MICROSCOPIC
Bilirubin Urine: NEGATIVE
Glucose, UA: NEGATIVE mg/dL
Hgb urine dipstick: NEGATIVE
Ketones, ur: NEGATIVE mg/dL
Leukocytes,Ua: NEGATIVE
Nitrite: NEGATIVE
Protein, ur: NEGATIVE mg/dL
Specific Gravity, Urine: 1.003 — ABNORMAL LOW (ref 1.005–1.030)
pH: 7 (ref 5.0–8.0)

## 2019-10-07 LAB — COMPREHENSIVE METABOLIC PANEL
ALT: 22 U/L (ref 0–44)
AST: 29 U/L (ref 15–41)
Albumin: 4.4 g/dL (ref 3.5–5.0)
Alkaline Phosphatase: 46 U/L (ref 38–126)
Anion gap: 12 (ref 5–15)
BUN: 23 mg/dL (ref 8–23)
CO2: 26 mmol/L (ref 22–32)
Calcium: 9.8 mg/dL (ref 8.9–10.3)
Chloride: 101 mmol/L (ref 98–111)
Creatinine, Ser: 0.8 mg/dL (ref 0.44–1.00)
GFR calc Af Amer: >60 mL/min (ref >60)
GFR calc non Af Amer: >60 mL/min (ref >60)
Glucose, Bld: 106 mg/dL — ABNORMAL HIGH (ref 70–99)
Potassium: 3.6 mmol/L (ref 3.5–5.1)
Sodium: 139 mmol/L (ref 135–145)
Total Bilirubin: 1.2 mg/dL (ref 0.3–1.2)
Total Protein: 7.6 g/dL (ref 6.5–8.1)

## 2019-10-07 LAB — RESPIRATORY PANEL BY RT PCR (FLU A&B, COVID)
Influenza A by PCR: NEGATIVE
Influenza B by PCR: NEGATIVE
SARS Coronavirus 2 by RT PCR: NEGATIVE

## 2019-10-07 LAB — CBC WITH DIFFERENTIAL/PLATELET
Abs Immature Granulocytes: 0.01 10*3/uL (ref 0.00–0.07)
Basophils Absolute: 0 10*3/uL (ref 0.0–0.1)
Basophils Relative: 0 %
Eosinophils Absolute: 0.1 10*3/uL (ref 0.0–0.5)
Eosinophils Relative: 1 %
HCT: 39.9 % (ref 36.0–46.0)
Hemoglobin: 12.7 g/dL (ref 12.0–15.0)
Immature Granulocytes: 0 %
Lymphocytes Relative: 27 %
Lymphs Abs: 1.9 10*3/uL (ref 0.7–4.0)
MCH: 28.7 pg (ref 26.0–34.0)
MCHC: 31.8 g/dL (ref 30.0–36.0)
MCV: 90.3 fL (ref 80.0–100.0)
Monocytes Absolute: 0.8 10*3/uL (ref 0.1–1.0)
Monocytes Relative: 11 %
Neutro Abs: 4.3 10*3/uL (ref 1.7–7.7)
Neutrophils Relative %: 61 %
Platelets: 182 10*3/uL (ref 150–400)
RBC: 4.42 MIL/uL (ref 3.87–5.11)
RDW: 12.6 % (ref 11.5–15.5)
WBC: 7.2 10*3/uL (ref 4.0–10.5)
nRBC: 0 % (ref 0.0–0.2)

## 2019-10-07 LAB — RAPID URINE DRUG SCREEN, HOSP PERFORMED
Amphetamines: NOT DETECTED
Barbiturates: NOT DETECTED
Benzodiazepines: NOT DETECTED
Cocaine: NOT DETECTED
Opiates: NOT DETECTED
Tetrahydrocannabinol: NOT DETECTED

## 2019-10-07 LAB — ETHANOL: Alcohol, Ethyl (B): <10 mg/dL (ref <10)

## 2019-10-07 LAB — SALICYLATE LEVEL: Salicylate Lvl: <7 mg/dL — ABNORMAL LOW (ref 7.0–30.0)

## 2019-10-07 LAB — ACETAMINOPHEN LEVEL: Acetaminophen (Tylenol), Serum: <10 ug/mL — ABNORMAL LOW (ref 10–30)

## 2019-10-07 MED ORDER — ZIPRASIDONE MESYLATE 20 MG IM SOLR: 20.0000 mg | INTRAMUSCULAR | Status: DC | PRN

## 2019-10-07 MED ORDER — ACETAMINOPHEN 325 MG PO TABS: 650.0000 mg | ORAL_TABLET | ORAL | Status: DC | PRN

## 2019-10-07 MED ORDER — NICOTINE 21 MG/24HR TD PT24
21.0000 mg | MEDICATED_PATCH | Freq: Every day | TRANSDERMAL | Status: DC
  Administered 2019-10-08: 21 mg via TRANSDERMAL
  Filled 2019-10-07: qty 1

## 2019-10-07 MED ORDER — ATENOLOL 50 MG PO TABS
50.0000 mg | ORAL_TABLET | Freq: Every day | ORAL | Status: DC
  Administered 2019-10-07: 50 mg via ORAL
  Filled 2019-10-07 (×2): qty 1

## 2019-10-07 MED ORDER — QUETIAPINE FUMARATE 100 MG PO TABS
100.0000 mg | ORAL_TABLET | Freq: Every day | ORAL | Status: DC
  Administered 2019-10-07: 100 mg via ORAL
  Filled 2019-10-07: qty 1

## 2019-10-07 MED ORDER — LORAZEPAM 1 MG PO TABS: 1.0000 mg | ORAL_TABLET | ORAL | Status: DC | PRN

## 2019-10-07 MED ORDER — ALUM & MAG HYDROXIDE-SIMETH 200-200-20 MG/5ML PO SUSP
30.0000 mL | Freq: Four times a day (QID) | ORAL | Status: DC | PRN
  Administered 2019-10-08: 30 mL via ORAL
  Filled 2019-10-07: qty 30

## 2019-10-07 MED ORDER — ZOLPIDEM TARTRATE 5 MG PO TABS
5.0000 mg | ORAL_TABLET | Freq: Every evening | ORAL | Status: DC | PRN
  Administered 2019-10-07: 5 mg via ORAL
  Filled 2019-10-07: qty 1

## 2019-10-07 MED ORDER — RISPERIDONE 1 MG PO TBDP: 2.0000 mg | ORAL_TABLET | Freq: Three times a day (TID) | ORAL | Status: DC | PRN

## 2019-10-07 MED ORDER — ONDANSETRON HCL 4 MG PO TABS: 4.0000 mg | ORAL_TABLET | Freq: Three times a day (TID) | ORAL | Status: DC | PRN

## 2019-10-07 NOTE — ED Notes (Addendum)
Patient refusing to dress out into burgundy scrubs.   Greta Doom, PA made aware and waiting for evaluation note before making patient dress out.

## 2019-10-07 NOTE — ED Notes (Signed)
Tele psych consult taking place

## 2019-10-07 NOTE — ED Notes (Signed)
TTS cart placed at bedside. 

## 2019-10-07 NOTE — Progress Notes (Signed)
Pt accepted to [facility] Inpatient Adult Unit.  Dr. Althea Grimmer is the accepting provider.    Call report to [phone].     [facility] @ [facility] ED notified.     Pt is Voluntary.    Pt may be transported by [employer].   Patient will be able to admit on October 08, 2019 after 8:00am.     Drucilla Schmidt, MSW, LCSW-A Clinical Disposition Social Worker [facility]/TTS [phone]

## 2019-10-07 NOTE — ED Notes (Signed)
Pt refused to change into scrubs and became agitated when told it is required for the doctor to come back to see her.
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
## 2019-10-07 NOTE — BH Assessment (Addendum)
Tele Assessment Note   Patient Name: [name] MRN: [id number] Referring Physician:  Location of Patient:  Location of Provider: [place] Department  [name] is an 69 y.o. female presenting voluntarily to [facility] ED for psych evaluation. Patient brought in by her sister, Estill Bamberg. Patient states she is unsure why she was brought to the hospital, as she thought she was only going to an outpatient therapy appointment. Patient is a poor historian due to [place]. Her speech is rapid and she has flight of ideas so at times is difficult to follow. Patient reports that her husband of 15 years passed away in 07-Jul-2023. Patient denies SI/HI/AVH. She rambles nonsensically throughout assessment, making statements such as "today is the first day I took off my black bra and panties." She states that she takes Seroquel nightly for schizophrenia. She admits to not sleeping. Patient denies any substance use or trauma history. Patient gives consent for TTS to speak with her brother, Carloyn Manner. This counselor attempted to reach him at [phone] without success. HIPPA compliant voice mail left.  Per ED triage note: " Pt has schizophrenia and not taking her medications and is seen at [facility] but would not go to be seen. Been living with her brother Carloyn Manner since Thanksgiving since she was allowing neighbors to run drop cords to their house since their electricity is off. Pt would go to the bank and withdrawal large amounts of money and given away to others.  Stays up all hours of the night and talks non-stop. Talks to dead people and ghosts. Hears voices. Paranoid about others stealing her possessions. Since her husband's death in 2023-07-07 pt has gotten worse."  Patient is alert and oriented x 2. She is dressed appropriately. Her speech is rapid, eye contact is good, and she has flight of ideas. Patient's mood is pleasant and her affect is congruent. She has poor insight, judgement, and impulse control. She does not  appear to be responding to internal stimuli.  Diagnosis: Schizophrenia (per history)  Past Medical History:  Past Medical History:  Diagnosis Date  . Chlamydia trachomatis infection of unspecified genitourinary site 12/18/2011  . Cough   . Disorder of bone and cartilage, unspecified   . Edema   . Hypercalcemia 02/14/2015  . Hyperlipemia   . Hyperparathyroidism   . Hypertension   . Osteopenia   . Other abnormal blood chemistry   . Schizophrenia ([place])    "little bit" (02/23/2015)  . Tachycardia, unspecified 05/29/2010  . Tobacco use disorder   . Unspecified glaucoma(365.9)   . Vitamin D deficiency     Past Surgical History:  Procedure Laterality Date  . BREAST EXCISIONAL BIOPSY Left   . NO PAST SURGERIES    . PARATHYROIDECTOMY N/A 03/15/2015   Procedure: PARATHYROIDECTOMY AND NECK EXPLORATION;  Surgeon: Armandina Gemma, MD;  Location: [facility] OR;  Service: General;  Laterality: N/A;    Family History:  Family History  Problem Relation Age of Onset  . Cancer Mother   . Cancer Brother        Throat  . Hypertension Other   . Diabetes Other     Social History:  reports that she has been smoking cigarettes. She has a 5.64 pack-year smoking history. She has never used smokeless tobacco. She reports that she does not drink alcohol or use drugs.  Additional Social History:  Alcohol / Drug Use Pain Medications: see MAR Prescriptions: see MAR Over the Counter: see MAR History of alcohol / drug use?: No  history of alcohol / drug abuse  CIWA: CIWA-Ar BP: (!) 169/75 Pulse Rate: (!) 119 COWS:    Allergies: No Known Allergies  Home Medications: (Not in a hospital admission)   OB/GYN Status:  No LMP recorded. Patient is postmenopausal.  General Assessment Data Location of Assessment: [facility] ED TTS Assessment: In system Is this a Tele or Face-to-Face Assessment?: Tele Assessment Is this an Initial Assessment or a Re-assessment for this encounter?: Initial Assessment Patient  Accompanied by:: Other(brother and sister in law) Language Other than English: No Living Arrangements: (her home) What gender do you identify as?: Female Marital status: Single Maiden name: Castles Pregnancy Status: No Living Arrangements: Alone Can pt return to current living arrangement?: Yes Admission Status: Voluntary Is patient capable of signing voluntary admission?: Yes Referral Source: Self/Family/Friend Insurance type: Medicare     Crisis Care Plan Living Arrangements: Alone Legal Guardian: (self) Name of Psychiatrist: Transport planner Name of Therapist: [facility]  Education Status Is patient currently in school?: No Is the patient employed, unemployed or receiving disability?: Receiving disability income  Risk to self with the past 6 months Suicidal Ideation: No Has patient been a risk to self within the past 6 months prior to admission? : No Suicidal Intent: No Has patient had any suicidal intent within the past 6 months prior to admission? : No Is patient at risk for suicide?: No Suicidal Plan?: No Has patient had any suicidal plan within the past 6 months prior to admission? : No Access to Means: No What has been your use of drugs/alcohol within the last 12 months?: none Previous Attempts/Gestures: No How many times?: 0 Other Self Harm Risks: none Triggers for Past Attempts: None known Intentional Self Injurious Behavior: None Family Suicide History: No Recent stressful life event(s): Loss (Comment)(husband passed away in 07-20-23) Persecutory voices/beliefs?: No Depression: No Depression Symptoms: Insomnia Substance abuse history and/or treatment for substance abuse?: No Suicide prevention information given to non-admitted patients: Not applicable  Risk to Others within the past 6 months Homicidal Ideation: No Does patient have any lifetime risk of violence toward others beyond the six months prior to admission? : No Thoughts of Harm to Others: No Current  Homicidal Intent: No Current Homicidal Plan: No Access to Homicidal Means: No Identified Victim: none History of harm to others?: No Assessment of Violence: None Noted Violent Behavior Description: none Does patient have access to weapons?: No Criminal Charges Pending?: No Does patient have a court date: No Is patient on probation?: No  Psychosis Hallucinations: Auditory Delusions: Persecutory, Unspecified  Mental Status Report Appearance/Hygiene: Unremarkable Eye Contact: Good Motor Activity: Freedom of movement Speech: Rapid, Pressured Level of Consciousness: Alert Mood: Pleasant Affect: Appropriate to circumstance Anxiety Level: Minimal Thought Processes: Flight of Ideas Judgement: Impaired Orientation: Person, Place, Time, Situation Obsessive Compulsive Thoughts/Behaviors: None  Cognitive Functioning Concentration: Normal Memory: Remote Intact, Recent Intact Is patient IDD: No Insight: Poor Impulse Control: Poor Appetite: Good Have you had any weight changes? : No Change Sleep: Decreased Total Hours of Sleep: (UTA) Vegetative Symptoms: None  ADLScreening [facility] Assessment Services) Patient's cognitive ability adequate to safely complete daily activities?: Yes Patient able to express need for assistance with ADLs?: Yes Independently performs ADLs?: Yes (appropriate for developmental age)  Prior Inpatient Therapy Prior Inpatient Therapy: No  Prior Outpatient Therapy Prior Outpatient Therapy: Yes Prior Therapy Dates: ongoing Prior Therapy Facilty/Provider(s): [facility] Reason for Treatment: med management Does patient have an ACCT team?: No Does patient have Intensive In-House Services?  : No Does patient  have [facility] services? : Yes Does patient have P4CC services?: No  ADL Screening (condition at time of admission) Patient's cognitive ability adequate to safely complete daily activities?: Yes Is the patient deaf or have difficulty hearing?: No Does the  patient have difficulty seeing, even when wearing glasses/contacts?: No Does the patient have difficulty concentrating, remembering, or making decisions?: No Patient able to express need for assistance with ADLs?: Yes Does the patient have difficulty dressing or bathing?: No Independently performs ADLs?: Yes (appropriate for developmental age) Does the patient have difficulty walking or climbing stairs?: No Weakness of Legs: None Weakness of Arms/Hands: None  Home Assistive Devices/Equipment Home Assistive Devices/Equipment: None  Therapy Consults (therapy consults require a physician order) PT Evaluation Needed: No OT Evalulation Needed: No SLP Evaluation Needed: No Abuse/Neglect Assessment (Assessment to be complete while patient is alone) Abuse/Neglect Assessment Can Be Completed: Unable to assess, patient is non-responsive or altered mental status Values / Beliefs Cultural Requests During Hospitalization: None Spiritual Requests During Hospitalization: None Consults Spiritual Care Consult Needed: No Transition of Care Team Consult Needed: No Advance Directives (For Healthcare) Does Patient Have a Medical Advance Directive?: Unable to assess, patient is non-responsive or altered mental status          Disposition: Berneice Heinrich, FNP recommends geri-psych. Disposition Initial Assessment Completed for this Encounter: Yes  This service was provided via telemedicine using a 2-way, interactive audio and video technology.  Names of all persons participating in this telemedicine service and their role in this encounter. Name: Johnney Ou Role: patient  Name: Celedonio Miyamoto, LCSW Role: TTS  Name:  Role:   Name:  Role:     Celedonio Miyamoto 10/07/2019 3:47 PM

## 2019-10-07 NOTE — ED Provider Notes (Signed)
[facility]-EMERGENCY DEPT Provider Note   CSN: [id number] Arrival date & time: 10/07/19  1314     History Chief Complaint  Patient presents with  . Medical Clearance    [name] is a 69 y.o. female with a past medical history of schizophrenia, hypertension, hyperlipidemia presenting to the ED for medical clearance.  She was sent in by her sister and brother for evaluation.  They are concerned because patient has a history of schizophrenia and has not been compliant with her medications. She has been living with her brother since the death of her husband last year and has been acting erratically, giving away money to her neighbors and letting them use her electricity out of her house. They are concerned that she is having auditory hallucinations and paranoia.  Patient denies any SI, HI or AVH.  She is unsure why she is here, states that she was post to have an appointment with her PCP but instead her siblings brought her to the emergency department.  HPI     Past Medical History:  Diagnosis Date  . Chlamydia trachomatis infection of unspecified genitourinary site 12/18/2011  . Cough   . Disorder of bone and cartilage, unspecified   . Edema   . Hypercalcemia 02/14/2015  . Hyperlipemia   . Hyperparathyroidism   . Hypertension   . Osteopenia   . Other abnormal blood chemistry   . Schizophrenia (HCC)    "little bit" (02/23/2015)  . Tachycardia, unspecified 05/29/2010  . Tobacco use disorder   . Unspecified glaucoma(365.9)   . Vitamin D deficiency     Patient Active Problem List   Diagnosis Date Noted  . Tobacco abuse 05/15/2016  . Hyperkalemia 03/16/2015  . Weak   . AKI (acute kidney injury) (HCC)   . Hypercalcemia 02/14/2015  . Essential hypertension 07/18/2014  . Prediabetes 07/18/2014  . Encounter for immunization 07/18/2014  . Primary hyperparathyroidism (HCC) 04/12/2014  . Right foot pain 06/14/2013  . Need for prophylactic vaccination and  inoculation against influenza 06/14/2013  . Routine general medical examination at a health care facility 02/23/2013  . Hyperlipidemia 02/23/2013  . Schizophrenia (HCC) 02/23/2013  . GERD (gastroesophageal reflux disease) 02/23/2013  . Glaucoma 02/23/2013  . Osteoarthritis of left hip 02/23/2013  . Osteoporosis 02/23/2013  . Hyperparathyroidism, primary (HCC) 05/20/2011    Past Surgical History:  Procedure Laterality Date  . BREAST EXCISIONAL BIOPSY Left   . NO PAST SURGERIES    . PARATHYROIDECTOMY N/A 03/15/2015   Procedure: PARATHYROIDECTOMY AND NECK EXPLORATION;  Surgeon: Darnell Level, MD;  Location: [facility] OR;  Service: General;  Laterality: N/A;     OB History   No obstetric history on file.     Family History  Problem Relation Age of Onset  . Cancer Mother   . Cancer Brother        Throat  . Hypertension Other   . Diabetes Other     Social History   Tobacco Use  . Smoking status: Current Some Day Smoker    Packs/day: 0.12    Years: 47.00    Pack years: 5.64    Types: Cigarettes  . Smokeless tobacco: Never Used  . Tobacco comment: Smokes every now and then, not everyday  Substance Use Topics  . Alcohol use: No    Alcohol/week: 0.0 standard drinks  . Drug use: No    Home Medications Prior to Admission medications   Medication Sig Start Date End Date Taking? Authorizing Provider  atenolol (  TENORMIN) 50 MG tablet Take 1 tablet (50 mg total) by mouth daily. 05/11/19   [name], NP  atorvastatin (LIPITOR) 20 MG tablet TAKE 1 TABLET (20 MG TOTAL) BY MOUTH DAILY. Patient taking differently: Take 20 mg by mouth daily. . 05/11/19   [name], NP  calcium carbonate (OSCAL) 1500 (600 Ca) MG TABS tablet Take 1,500 mg by mouth daily.    [provider]  cholecalciferol (VITAMIN D3) 25 MCG (1000 UT) tablet Take 1,000 Units by mouth daily.    [provider]  LUMIGAN 0.01 % SOLN Place 1 drop into both eyes at bedtime. 02/27/15   [provider]  magnesium gluconate (MAGONATE) 500 MG tablet Take 1 tablet (500 mg total) by mouth daily. 03/08/15   [name], NP  QUEtiapine (SEROQUEL) 50 MG tablet Take 100 mg by mouth at bedtime.    [provider]  SIMBRINZA 1-0.2 % SUSP Place 1 drop into both eyes 3 (three) times daily. 02/13/18   [provider]    Allergies    Patient has no known allergies.  Review of Systems   Review of Systems  Constitutional: Negative for appetite change, chills and fever.  HENT: Negative for ear pain, rhinorrhea, sneezing and sore throat.   Eyes: Negative for photophobia and visual disturbance.  Respiratory: Negative for cough, chest tightness, shortness of breath and wheezing.   Cardiovascular: Negative for chest pain and palpitations.  Gastrointestinal: Negative for abdominal pain, blood in stool, constipation, diarrhea, nausea and vomiting.  Genitourinary: Negative for dysuria, hematuria and urgency.  Musculoskeletal: Negative for myalgias.  Skin: Negative for rash.  Neurological: Negative for dizziness, weakness and light-headedness.    Physical Exam Updated Vital Signs BP (!) 169/75 (BP Location: Left Arm)   Pulse (!) 119   Temp 98.5 F (36.9 C) (Oral)   Resp 17   SpO2 100%   Physical Exam Vitals and nursing note reviewed.  Constitutional:      General: She is not in acute distress.    Appearance: She is well-developed.  HENT:     Head: Normocephalic and atraumatic.     Nose: Nose normal.  Eyes:     General: No scleral icterus.       Left eye: No discharge.     Conjunctiva/sclera: Conjunctivae normal.  Cardiovascular:     Rate and Rhythm: Normal rate and regular rhythm.     Heart sounds: Normal heart sounds. No murmur. No friction rub. No gallop.   Pulmonary:     Effort: Pulmonary effort is normal. No respiratory distress.     Breath sounds: Normal breath sounds.  Abdominal:     General: Bowel sounds are normal. There is no distension.      Palpations: Abdomen is soft.     Tenderness: There is no abdominal tenderness. There is no guarding.  Musculoskeletal:        General: Normal range of motion.     Cervical back: Normal range of motion and neck supple.  Skin:    General: Skin is warm and dry.     Findings: No rash.  Neurological:     Mental Status: She is alert.     Motor: No abnormal muscle tone.     Coordination: Coordination normal.     ED Results / Procedures / Treatments   Labs (all labs ordered are listed, but only abnormal results are displayed) Labs Reviewed  COMPREHENSIVE METABOLIC PANEL - Abnormal; Notable for the following components:  Result Value   Glucose, Bld 106 (*)    All other components within normal limits  CBC WITH DIFFERENTIAL/PLATELET  ETHANOL  RAPID URINE DRUG SCREEN, HOSP PERFORMED  SALICYLATE LEVEL  ACETAMINOPHEN LEVEL  URINALYSIS, ROUTINE W REFLEX MICROSCOPIC    EKG None  Radiology No results found.  Procedures Procedures (including critical care time)  Medications Ordered in ED Medications - No data to display  ED Course  I have reviewed the triage vital signs and the nursing notes.  Pertinent labs & imaging results that were available during my care of the patient were reviewed by me and considered in my medical decision making (see chart for details).    MDM Rules/Calculators/A&P                      69 year old female presenting to the ED for psychiatric evaluation medical clearance.  Brother and sister are concerned that she is not treating her schizophrenia and has gotten increasingly more paranoid with erratic behavior including giving away money.  She is denying any SI, HI or AVH.  Will obtain medical screening lab work and consult TTS for evaluation. Care handed off to oncoming provider pending remainder of workup and TTS disposition.  Final Clinical Impression(s) / ED Diagnoses Final diagnoses:  Schizophrenia, unspecified type (HCC)    Rx / DC  Orders ED Discharge Orders    None     Portions of this note were generated with Dragon dictation software. Dictation errors may occur despite best attempts at proofreading.    Dietrich Pates, PA-C 10/07/19 1529    Lorre Nick, MD 10/10/19 1527

## 2019-10-07 NOTE — ED Notes (Signed)
[name] tomorrow morning after 8am.

## 2019-10-07 NOTE — Progress Notes (Signed)
Patient meets Gero-psych inpatient criteria per Berneice Heinrich, NP. Patient has been faxed out to the following facilities for review:   [facility] Details      [facility] Details       [facility] Details       [facility] Details       [facility] Details       [facility] Details       [facility] Details       [facility] Details       [facility] Details       [facility]   CSW will continue to follow and assist with disposition planning.   Drucilla Schmidt, MSW, LCSW-A Clinical Disposition Social Worker [facility]/TTS [phone]

## 2019-10-07 NOTE — ED Notes (Signed)
After calling security for assistance and a long conversation with staff Mrs Rufo was convince to change into [place] scrubs. Phone conversation with family member [name] over pt cellphone who informed me that Mrs. Barsamian was displaying aggressive bx and was making threats to family with a sword she has at her home. Mrs Mossbarger denies all  Statement made by her family.

## 2019-10-07 NOTE — ED Provider Notes (Addendum)
Patient signed out to me at the beginning of shift.  Please see previous providers note for complete H&P. Patient with history of schizophrenia brought here by sister and brother for evaluation of erratic behaviors.  Parent concerned that she is having auditory hallucination and paranoia and unable to make sound judgment.  Patient is medically cleared, TTS has evaluated patient and felt she meets inpatient criteria for [facility] psych admission.  7:56 PM Pt refused to change into a gown and refuse treatment from staff.  Will file IVC and provide medication for her agitation.  BP (!) 169/75 (BP Location: Left Arm)   Pulse (!) 119   Temp 98.5 F (36.9 C) (Oral)   Resp 17   SpO2 100%   Results for orders placed or performed during the hospital encounter of 10/07/19  Comprehensive metabolic panel  Result Value Ref Range   Sodium 139 135 - 145 mmol/L   Potassium 3.6 3.5 - 5.1 mmol/L   Chloride 101 98 - 111 mmol/L   CO2 26 22 - 32 mmol/L   Glucose, Bld 106 (H) 70 - 99 mg/dL   BUN 23 8 - 23 mg/dL   Creatinine, Ser 0.80 0.44 - 1.00 mg/dL   Calcium 9.8 8.9 - 10.3 mg/dL   Total Protein 7.6 6.5 - 8.1 g/dL   Albumin 4.4 3.5 - 5.0 g/dL   AST 29 15 - 41 U/L   ALT 22 0 - 44 U/L   Alkaline Phosphatase 46 38 - 126 U/L   Total Bilirubin 1.2 0.3 - 1.2 mg/dL   GFR calc non Af Amer >60 >60 mL/min   GFR calc Af Amer >60 >60 mL/min   Anion gap 12 5 - 15  Ethanol  Result Value Ref Range   Alcohol, Ethyl (B) <10 <10 mg/dL  Urine rapid drug screen (hosp performed)  Result Value Ref Range   Opiates NONE DETECTED NONE DETECTED   Cocaine NONE DETECTED NONE DETECTED   Benzodiazepines NONE DETECTED NONE DETECTED   Amphetamines NONE DETECTED NONE DETECTED   Tetrahydrocannabinol NONE DETECTED NONE DETECTED   Barbiturates NONE DETECTED NONE DETECTED  CBC with Diff  Result Value Ref Range   WBC 7.2 4.0 - 10.5 K/uL   RBC 4.42 3.87 - 5.11 MIL/uL   Hemoglobin 12.7 12.0 - 15.0 g/dL   HCT 39.9 36.0 - 46.0 %   MCV 90.3 80.0 - 100.0 fL   MCH 28.7 26.0 - 34.0 pg   MCHC 31.8 30.0 - 36.0 g/dL   RDW 12.6 11.5 - 15.5 %   Platelets 182 150 - 400 K/uL   nRBC 0.0 0.0 - 0.2 %   Neutrophils Relative % 61 %   Neutro Abs 4.3 1.7 - 7.7 K/uL   Lymphocytes Relative 27 %   Lymphs Abs 1.9 0.7 - 4.0 K/uL   Monocytes Relative 11 %   Monocytes Absolute 0.8 0.1 - 1.0 K/uL   Eosinophils Relative 1 %   Eosinophils Absolute 0.1 0.0 - 0.5 K/uL   Basophils Relative 0 %   Basophils Absolute 0.0 0.0 - 0.1 K/uL   Immature Granulocytes 0 %   Abs Immature Granulocytes 0.01 2.42 - 6.83 K/uL  Salicylate level  Result Value Ref Range   Salicylate Lvl <4.1 (L) 7.0 - 30.0 mg/dL  Acetaminophen level  Result Value Ref Range   Acetaminophen (Tylenol), Serum <10 (L) 10 - 30 ug/mL  Urinalysis, Routine w reflex microscopic  Result Value Ref Range   Color, Urine STRAW (A) YELLOW  APPearance CLEAR CLEAR   Specific Gravity, Urine 1.003 (L) 1.005 - 1.030   pH 7.0 5.0 - 8.0   Glucose, UA NEGATIVE NEGATIVE mg/dL   Hgb urine dipstick NEGATIVE NEGATIVE   Bilirubin Urine NEGATIVE NEGATIVE   Ketones, ur NEGATIVE NEGATIVE mg/dL   Protein, ur NEGATIVE NEGATIVE mg/dL   Nitrite NEGATIVE NEGATIVE   Leukocytes,Ua NEGATIVE NEGATIVE   No results found.    Fayrene Helper, PA-C 10/07/19 1907    Fayrene Helper, PA-C 10/07/19 Brooke Pace    Donnetta Hutching, MD 10/08/19 [phone]

## 2019-10-08 NOTE — ED Notes (Signed)
Pt given coffee per request

## 2019-10-08 NOTE — ED Notes (Signed)
Pt sitting in recliner watching tv at this time

## 2019-10-08 NOTE — ED Notes (Signed)
Called the sheriff for transport .  Left a message. The sheriff office has not called back.

## 2019-10-08 NOTE — ED Notes (Signed)
Pt given phone to call brother. Pt brother did not answer, pt left a voicemail.

## 2019-10-08 NOTE — ED Notes (Signed)
Pt agitated and confused as to why she is here. This NT and RN explained to pt that she is being seen to make sure she is safe. Pt given water and juice.

## 2019-10-08 NOTE — ED Notes (Signed)
Pt awake at this time. This NT introduced self to pt and informed her breakfast should be up soon. Pt asking about the weather outside. Pt watching tv at this time.

## 2019-10-08 NOTE — ED Provider Notes (Signed)
Emergency Medicine Observation Re-evaluation Note  [name] is a 69 y.o. female, seen on rounds today.  Pt initially presented to the ED for complaints of Medical Clearance Currently, the patient is calm and cooperative.  Physical Exam  BP 120/67 (BP Location: Right Arm)   Pulse (!) 59   Temp 98.2 F (36.8 C) (Oral)   Resp 18   SpO2 99%  Physical Exam Constitutional:      General: She is not in acute distress. HENT:     Nose: No rhinorrhea.  Pulmonary:     Effort: Pulmonary effort is normal. No respiratory distress.  Neurological:     Mental Status: She is alert.     ED Course / MDM  EKG:    I have reviewed the labs performed to date as well as medications administered while in observation.  Recent changes in the last 24 hours include psychiatric assessment and plan for inpatient treatment.  IVC was filled out Plan  Current plan is for inpatient psychiatric treatment. Patient is under full IVC at this time.   Linwood Dibbles, MD 10/08/19 [phone]

## 2019-10-08 NOTE — ED Notes (Signed)
Pt given breakfast tray and set up to eat.  

## 2019-10-08 NOTE — ED Notes (Signed)
Pt given warm blanket per request.

## 2019-10-08 NOTE — ED Notes (Signed)
Pt ambulated to restroom. 

## 2019-10-08 NOTE — ED Notes (Signed)
Pt DC off unit to [facility] per provider. Pt alert, calm, cooperative, confused, no s/s of distress. DC information given to sheriff for [facility]. Belongings given to pt sheriff for [employer] .  Pt ambulatory off the unit, escorted and transported by [employer].

## 2019-10-08 NOTE — ED Notes (Signed)
Pt resting comfortably

## 2019-10-08 NOTE — ED Notes (Signed)
Sheriff dept called, will be here in about 30 minutes to transport patient to [facility]

## 2019-10-08 NOTE — ED Notes (Signed)
EKG completed and give to RN

## 2019-10-17 ENCOUNTER — Ambulatory Visit (INDEPENDENT_AMBULATORY_CARE_PROVIDER_SITE_OTHER): Payer: Medicare Other | Admitting: Nurse Practitioner

## 2019-10-17 ENCOUNTER — Other Ambulatory Visit: Payer: Self-pay

## 2019-10-17 ENCOUNTER — Encounter: Payer: Self-pay | Admitting: Nurse Practitioner

## 2019-10-17 VITALS — BP 130/80 | HR 88 | Temp 97.7°F | Resp 16 | Ht 64.0 in | Wt 127.8 lb

## 2019-10-17 DIAGNOSIS — E782 Mixed hyperlipidemia: Secondary | ICD-10-CM | POA: Diagnosis not present

## 2019-10-17 DIAGNOSIS — H409 Unspecified glaucoma: Secondary | ICD-10-CM

## 2019-10-17 DIAGNOSIS — Z720 Tobacco use: Secondary | ICD-10-CM

## 2019-10-17 DIAGNOSIS — F209 Schizophrenia, unspecified: Secondary | ICD-10-CM

## 2019-10-17 DIAGNOSIS — I10 Essential (primary) hypertension: Secondary | ICD-10-CM

## 2019-10-17 NOTE — Progress Notes (Signed)
Careteam: Patient Care Team: [name], NP as PCP - General (Nurse Practitioner)  Advanced Directive information    No Known Allergies  Chief Complaint  Patient presents with  . Follow-up    Follow up from recent ER visit and [facility] discharge      HPI: Patient is a 69 y.o. female for follow up.   She missed some appts and said she was at her sisters house.   Reports she went to [facility] for medical clearance. Then she went to [place]- said she does not know why she was there.   Her sister brings in some paperowrk from [facility] discharge.  She was admitted due to schizophrenia  She became non-complaint with her medication Reports her husband passed away in Aug 02, 2023 and had decompensated since.  She was placed on seroquel 200 mg at bedtime.  Reports she is now sleeping better.   Denies thoughts of hurting herself or hurting others.  No hallucinations.  Brother is trying to get guardianship because they feel she is unable to care for herself independently even after her hospitalization they feel like there is some of the same issues.  She continues to have disorganized thoughts and delusions.  Sister-in-law is helping her now and making sure she is getting her medication now.  She is following with with psychiatrist on Feb 4th.   Unsure about her eye drops. Does not know what medication, dose or frequency she is on Sister reports they need to check with her ophthalmologist to get this straighten out.   Review of Systems:  Review of Systems  Constitutional: Negative for chills, fever and weight loss.  HENT: Negative for tinnitus.   Respiratory: Negative for cough, sputum production and shortness of breath.   Cardiovascular: Negative for chest pain, palpitations and leg swelling.  Gastrointestinal: Negative for abdominal pain, constipation, diarrhea and heartburn.  Genitourinary: Negative for dysuria, frequency and urgency.  Musculoskeletal: Negative for  back pain, falls, joint pain and myalgias.  Skin: Negative.   Neurological: Negative for dizziness and headaches.  Psychiatric/Behavioral: Positive for hallucinations. Negative for depression and memory loss. The patient is nervous/anxious. The patient does not have insomnia.     Past Medical History:  Diagnosis Date  . Chlamydia trachomatis infection of unspecified genitourinary site 12/18/2011  . Cough   . Disorder of bone and cartilage, unspecified   . Edema   . Hypercalcemia 02/14/2015  . Hyperlipemia   . Hyperparathyroidism   . Hypertension   . Osteopenia   . Other abnormal blood chemistry   . Schizophrenia ([place])    "little bit" (02/23/2015)  . Tachycardia, unspecified 05/29/2010  . Tobacco use disorder   . Unspecified glaucoma(365.9)   . Vitamin D deficiency    Past Surgical History:  Procedure Laterality Date  . BREAST EXCISIONAL BIOPSY Left   . NO PAST SURGERIES    . PARATHYROIDECTOMY N/A 03/15/2015   Procedure: PARATHYROIDECTOMY AND NECK EXPLORATION;  Surgeon: Armandina Gemma, MD;  Location: [place];  Service: General;  Laterality: N/A;   Social History:   reports that she has been smoking cigarettes. She has a 5.64 pack-year smoking history. She has never used smokeless tobacco. She reports that she does not drink alcohol or use drugs.  Family History  Problem Relation Age of Onset  . Cancer Mother   . Cancer Brother        Throat  . Hypertension Other   . Diabetes Other     Medications: Patient's Medications  New Prescriptions   No medications on file  Previous Medications   ATENOLOL (TENORMIN) 50 MG TABLET    Take 1 tablet (50 mg total) by mouth daily.   ATORVASTATIN (LIPITOR) 20 MG TABLET    TAKE 1 TABLET (20 MG TOTAL) BY MOUTH DAILY.   CALCIUM CARBONATE (OSCAL) 1500 (600 CA) MG TABS TABLET    Take 1,500 mg by mouth daily.   CHOLECALCIFEROL (VITAMIN D3) 25 MCG (1000 UT) TABLET    Take 1,000 Units by mouth daily.   LUMIGAN 0.01 % SOLN    Place 1 drop into both  eyes at bedtime.   MAGNESIUM GLUCONATE (MAGONATE) 500 MG TABLET    Take 1 tablet (500 mg total) by mouth daily.   QUETIAPINE (SEROQUEL) 50 MG TABLET    Take 100 mg by mouth at bedtime.  Modified Medications   No medications on file  Discontinued Medications   No medications on file    Physical Exam:  Vitals:   10/17/19 1447  BP: 130/80  Pulse: 88  Resp: 16  Temp: 97.7 F (36.5 C)  SpO2: 91%  Weight: 127 lb 12.8 oz (58 kg)  Height: 5\' 4"  (1.626 m)   Body mass index is 21.94 kg/m. Wt Readings from Last 3 Encounters:  10/17/19 127 lb 12.8 oz (58 kg)  07/09/19 119 lb 0.8 oz (54 kg)  05/11/19 121 lb (54.9 kg)    Physical Exam Constitutional:      General: She is not in acute distress.    Appearance: She is well-developed. She is not diaphoretic.  HENT:     Head: Normocephalic and atraumatic.     Mouth/Throat:     Pharynx: No oropharyngeal exudate.  Eyes:     General:        Right eye: No discharge.        Left eye: No discharge.     Conjunctiva/sclera:     Right eye: Right conjunctiva is injected.     Left eye: Left conjunctiva is injected.     Pupils: Pupils are equal, round, and reactive to light.  Cardiovascular:     Rate and Rhythm: Normal rate and regular rhythm.     Heart sounds: Normal heart sounds.  Pulmonary:     Effort: Pulmonary effort is normal.     Breath sounds: Normal breath sounds.  Abdominal:     General: Bowel sounds are normal.     Palpations: Abdomen is soft.  Musculoskeletal:        General: No tenderness.     Cervical back: Normal range of motion and neck supple.  Skin:    General: Skin is warm and dry.  Neurological:     Mental Status: She is alert and oriented to person, place, and time.     Labs reviewed: Basic Metabolic Panel: Recent Labs    03/30/19 1548 10/07/19 1414  NA 142 139  K 3.4* 3.6  CL 103 101  CO2 27 26  GLUCOSE 82 106*  BUN 15 23  CREATININE 0.79 0.80  CALCIUM 10.2 9.8  TSH 2.16  --    Liver Function  Tests: Recent Labs    03/30/19 1548 10/07/19 1414  AST 20 29  ALT 11 22  ALKPHOS  --  46  BILITOT 0.8 1.2  PROT 7.0 7.6  ALBUMIN  --  4.4   No results for input(s): LIPASE, AMYLASE in the last 8760 hours. No results for input(s): AMMONIA in the last 8760 hours. CBC: Recent Labs  03/30/19 1548 10/07/19 1414  WBC 6.2 7.2  NEUTROABS 3,931 4.3  HGB 13.7 12.7  HCT 41.1 39.9  MCV 86.9 90.3  PLT 213 182   Lipid Panel: Recent Labs    03/30/19 1548  CHOL 143  HDL 52  LDLCALC 78  TRIG 54  CHOLHDL 2.8   TSH: Recent Labs    03/30/19 1548  TSH 2.16   A1C: Lab Results  Component Value Date   HGBA1C 5.6 03/30/2019     Assessment/Plan 1. Schizophrenia, unspecified type [facility]) -recently admitted to [facility] she has improved but still needing supervision and help with ADLs. Family is trying to become her guardian as since her husband died she is unable to manage her affairs and has been declining in her mental health. Continues to need close supervision. Has follow up scheduled with psychiatrist in february.  -continues on seroquel 200 mg qhs. She has been taking routinely with the help of her brother.   2. Essential hypertension -controlled on atenolol.   3. Glaucoma of both eyes, unspecified glaucoma type There is some confusion over her eye drops. She has hx of glaucoma. Denies pain or drainage to eyes however slightly red bilaterally. Stressed the importance to call ophthalmologist immediately for clarification on what she should be taking and follow up visit. Sister in agreement and plans to do this.   4. Mixed hyperlipidemia Stable on Lipitor  5. Tobacco use Encouraged cessation.   Next appt: 3 months. Janene Harvey. Biagio Borg  [facility] [phone]

## 2019-11-05 ENCOUNTER — Other Ambulatory Visit: Payer: Self-pay | Admitting: Nurse Practitioner

## 2019-11-05 DIAGNOSIS — I10 Essential (primary) hypertension: Secondary | ICD-10-CM

## 2019-12-01 ENCOUNTER — Other Ambulatory Visit: Payer: Self-pay | Admitting: Nurse Practitioner

## 2019-12-01 DIAGNOSIS — E782 Mixed hyperlipidemia: Secondary | ICD-10-CM

## 2019-12-22 ENCOUNTER — Other Ambulatory Visit: Payer: Self-pay | Admitting: *Deleted

## 2019-12-22 DIAGNOSIS — E782 Mixed hyperlipidemia: Secondary | ICD-10-CM

## 2019-12-22 MED ORDER — ATORVASTATIN CALCIUM 20 MG PO TABS: ORAL_TABLET | ORAL | 1 refills | Status: DC

## 2019-12-22 NOTE — Telephone Encounter (Signed)
Patient requested refill Faxed to pharmacy.  

## 2020-01-18 ENCOUNTER — Encounter: Payer: Self-pay | Admitting: Nurse Practitioner

## 2020-01-18 ENCOUNTER — Other Ambulatory Visit: Payer: Self-pay

## 2020-01-18 ENCOUNTER — Ambulatory Visit (INDEPENDENT_AMBULATORY_CARE_PROVIDER_SITE_OTHER): Payer: Medicare Other | Admitting: Nurse Practitioner

## 2020-01-18 VITALS — BP 138/80 | HR 69 | Temp 97.5°F | Resp 16 | Ht 64.0 in | Wt 134.6 lb

## 2020-01-18 DIAGNOSIS — I10 Essential (primary) hypertension: Secondary | ICD-10-CM | POA: Diagnosis not present

## 2020-01-18 DIAGNOSIS — Z720 Tobacco use: Secondary | ICD-10-CM

## 2020-01-18 DIAGNOSIS — M8580 Other specified disorders of bone density and structure, unspecified site: Secondary | ICD-10-CM

## 2020-01-18 DIAGNOSIS — F209 Schizophrenia, unspecified: Secondary | ICD-10-CM | POA: Diagnosis not present

## 2020-01-18 DIAGNOSIS — H409 Unspecified glaucoma: Secondary | ICD-10-CM | POA: Diagnosis not present

## 2020-01-18 DIAGNOSIS — E782 Mixed hyperlipidemia: Secondary | ICD-10-CM | POA: Diagnosis not present

## 2020-01-18 NOTE — Progress Notes (Deleted)
Careteam: Patient Care Team: [name], NP as PCP - General (Nurse Practitioner)  PLACE OF SERVICE:  [facility]  Advanced Directive information Does Patient Have a Medical Advance Directive?: Yes, Type of Advance Directive: Out of facility DNR (pink MOST or yellow form), Does patient want to make changes to medical advance directive?: No - Patient declined  No Known Allergies  Chief Complaint  Patient presents with  . Medical Management of Chronic Issues    3 Month Follow Up.  . Immunizations    Discuss the need for Covid Vaccine.      HPI: Patient is a 69 y.o. female ***  Review of Systems:  ROS***  Past Medical History:  Diagnosis Date  . Chlamydia trachomatis infection of unspecified genitourinary site 12/18/2011  . Cough   . Disorder of bone and cartilage, unspecified   . Edema   . Hypercalcemia 02/14/2015  . Hyperlipemia   . Hyperparathyroidism   . Hypertension   . Osteopenia   . Other abnormal blood chemistry   . Schizophrenia ([facility])    "little bit" (02/23/2015)  . Tachycardia, unspecified 05/29/2010  . Tobacco use disorder   . Unspecified glaucoma(365.9)   . Vitamin D deficiency    Past Surgical History:  Procedure Laterality Date  . BREAST EXCISIONAL BIOPSY Left   . NO PAST SURGERIES    . PARATHYROIDECTOMY N/A 03/15/2015   Procedure: PARATHYROIDECTOMY AND NECK EXPLORATION;  Surgeon: Darnell Level, MD;  Location: [facility] OR;  Service: General;  Laterality: N/A;   Social History:   reports that she has been smoking cigarettes. She has a 5.64 pack-year smoking history. She has never used smokeless tobacco. She reports that she does not drink alcohol or use drugs.  Family History  Problem Relation Age of Onset  . Cancer Mother   . Cancer Brother        Throat  . Hypertension Other   . Diabetes Other     Medications: Patient's Medications  New Prescriptions   No medications on file  Previous Medications   ATENOLOL (TENORMIN) 50 MG TABLET     TAKE 1 TABLET(50 MG) BY MOUTH DAILY   ATORVASTATIN (LIPITOR) 20 MG TABLET    TAKE 1 TABLET(20 MG) BY MOUTH DAILY   CALCIUM CARBONATE (OSCAL) 1500 (600 CA) MG TABS TABLET    Take 1,500 mg by mouth daily.   CHOLECALCIFEROL (VITAMIN D3) 25 MCG (1000 UT) TABLET    Take 1,000 Units by mouth daily.   LUMIGAN 0.01 % SOLN    Place 1 drop into both eyes at bedtime.   MAGNESIUM GLUCONATE (MAGONATE) 500 MG TABLET    Take 1 tablet (500 mg total) by mouth daily.   QUETIAPINE (SEROQUEL) 100 MG TABLET    Take 200 mg by mouth at bedtime.  Modified Medications   No medications on file  Discontinued Medications   No medications on file    Physical Exam:  Vitals:   01/18/20 1342  BP: 138/80  Pulse: 69  Resp: 16  Temp: (!) 97.5 F (36.4 C)  SpO2: 97%  Weight: 134 lb 9.6 oz (61.1 kg)  Height: 5\' 4"  (1.626 m)   Body mass index is 23.1 kg/m. Wt Readings from Last 3 Encounters:  01/18/20 134 lb 9.6 oz (61.1 kg)  10/17/19 127 lb 12.8 oz (58 kg)  07/09/19 119 lb 0.8 oz (54 kg)    Physical Exam***  Labs reviewed: Basic Metabolic Panel: Recent Labs    03/30/19 1548 10/07/19 1414  NA 142 139  K 3.4* 3.6  CL 103 101  CO2 27 26  GLUCOSE 82 106*  BUN 15 23  CREATININE 0.79 0.80  CALCIUM 10.2 9.8  TSH 2.16  --    Liver Function Tests: Recent Labs    03/30/19 1548 10/07/19 1414  AST 20 29  ALT 11 22  ALKPHOS  --  46  BILITOT 0.8 1.2  PROT 7.0 7.6  ALBUMIN  --  4.4   No results for input(s): LIPASE, AMYLASE in the last 8760 hours. No results for input(s): AMMONIA in the last 8760 hours. CBC: Recent Labs    03/30/19 1548 10/07/19 1414  WBC 6.2 7.2  NEUTROABS 3,931 4.3  HGB 13.7 12.7  HCT 41.1 39.9  MCV 86.9 90.3  PLT 213 182   Lipid Panel: Recent Labs    03/30/19 1548  CHOL 143  HDL 52  LDLCALC 78  TRIG 54  CHOLHDL 2.8   TSH: Recent Labs    03/30/19 1548  TSH 2.16   A1C: Lab Results  Component Value Date   HGBA1C 5.6 03/30/2019      Assessment/Plan There are no diagnoses linked to this encounter.  Next appt: *** Damonte Frieson K. [place], [facility] [phone]

## 2020-01-18 NOTE — Progress Notes (Signed)
Careteam: Patient Care Team: [name], NP as PCP - General (Nurse Practitioner)  PLACE OF SERVICE:  [facility]  Advanced Directive information Does Patient Have a Medical Advance Directive?: Yes, Type of Advance Directive: Out of facility DNR (pink MOST or yellow form), Does patient want to make changes to medical advance directive?: No - Patient declined  No Known Allergies  Chief Complaint  Patient presents with  . Medical Management of Chronic Issues    3 Month Follow Up.  . Immunizations    Discuss the need for Covid Vaccine.      HPI: Patient is a 69 y.o. female presents today for a 3 month follow up with her sister. Pt reports she had a 3 hour evaluation at [facility] yesterday to see if she can stay alone by herself. Pt has been staying at her sisters house due to not being able to take care of herself. She is staying between her sister and other siblings houses.continues to follow up with psych due to schizophrenia.    Pt has new glasses and did follow up the ophthalmologist, hx of glaucoma.   Pt thinks that the "vitamins are making her fat and that she has gained too much weight" (127-134 lb in 3 months)   Pt is continuing to smoke a couple of cigarettes a week.   Pt is taking her blood pressure medication and hyperlipemia medication as prescribed.   Review of Systems:  Review of Systems  Constitutional: Negative for chills and fever.  HENT: Negative for hearing loss.   Eyes: Negative.   Respiratory: Negative for cough and shortness of breath.   Cardiovascular: Negative for chest pain.  Gastrointestinal: Negative for nausea and vomiting.  Genitourinary: Negative.   Musculoskeletal: Negative for falls.  Skin: Negative.   Neurological: Negative for dizziness, weakness and headaches.  Endo/Heme/Allergies: Does not bruise/bleed easily.  Psychiatric/Behavioral: Negative for depression. The patient is not nervous/anxious.     Past Medical History:    Diagnosis Date  . Chlamydia trachomatis infection of unspecified genitourinary site 12/18/2011  . Cough   . Disorder of bone and cartilage, unspecified   . Edema   . Hypercalcemia 02/14/2015  . Hyperlipemia   . Hyperparathyroidism   . Hypertension   . Osteopenia   . Other abnormal blood chemistry   . Schizophrenia ([facility])    "little bit" (02/23/2015)  . Tachycardia, unspecified 05/29/2010  . Tobacco use disorder   . Unspecified glaucoma(365.9)   . Vitamin D deficiency    Past Surgical History:  Procedure Laterality Date  . BREAST EXCISIONAL BIOPSY Left   . NO PAST SURGERIES    . PARATHYROIDECTOMY N/A 03/15/2015   Procedure: PARATHYROIDECTOMY AND NECK EXPLORATION;  Surgeon: Darnell Level, MD;  Location: [facility] OR;  Service: General;  Laterality: N/A;   Social History:   reports that she has been smoking cigarettes. She has a 5.64 pack-year smoking history. She has never used smokeless tobacco. She reports that she does not drink alcohol or use drugs.  Family History  Problem Relation Age of Onset  . Cancer Mother   . Cancer Brother        Throat  . Hypertension Other   . Diabetes Other     Medications: Patient's Medications  New Prescriptions   No medications on file  Previous Medications   ATENOLOL (TENORMIN) 50 MG TABLET    TAKE 1 TABLET(50 MG) BY MOUTH DAILY   ATORVASTATIN (LIPITOR) 20 MG TABLET    TAKE 1  TABLET(20 MG) BY MOUTH DAILY   CALCIUM CARBONATE (OSCAL) 1500 (600 CA) MG TABS TABLET    Take 1,500 mg by mouth daily.   CHOLECALCIFEROL (VITAMIN D3) 25 MCG (1000 UT) TABLET    Take 1,000 Units by mouth daily.   LUMIGAN 0.01 % SOLN    Place 1 drop into both eyes at bedtime.   MAGNESIUM GLUCONATE (MAGONATE) 500 MG TABLET    Take 1 tablet (500 mg total) by mouth daily.   QUETIAPINE (SEROQUEL) 100 MG TABLET    Take 200 mg by mouth at bedtime.  Modified Medications   No medications on file  Discontinued Medications   No medications on file    Physical Exam:  Vitals:    01/18/20 1342  BP: 138/80  Pulse: 69  Resp: 16  Temp: (!) 97.5 F (36.4 C)  SpO2: 97%  Weight: 134 lb 9.6 oz (61.1 kg)  Height: 5\' 4"  (1.626 m)   Body mass index is 23.1 kg/m. Wt Readings from Last 3 Encounters:  01/18/20 134 lb 9.6 oz (61.1 kg)  10/17/19 127 lb 12.8 oz (58 kg)  07/09/19 119 lb 0.8 oz (54 kg)    Physical Exam Constitutional:      Appearance: Normal appearance.  Eyes:     Conjunctiva/sclera:     Right eye: Right conjunctiva is injected.     Left eye: Left conjunctiva is injected.  Cardiovascular:     Rate and Rhythm: Normal rate and regular rhythm.     Pulses: Normal pulses.     Heart sounds: Normal heart sounds.  Pulmonary:     Effort: Pulmonary effort is normal.     Breath sounds: Normal breath sounds.  Abdominal:     General: Bowel sounds are normal.     Palpations: Abdomen is soft.  Musculoskeletal:     Right lower leg: No edema.     Left lower leg: No edema.  Skin:    General: Skin is warm and dry.  Neurological:     Mental Status: She is alert. She is confused.  Psychiatric:        Mood and Affect: Mood is elated.        Speech: Speech normal.        Behavior: Behavior is cooperative.        Cognition and Memory: Cognition is impaired. Memory is impaired.        Judgment: Judgment is inappropriate (pt is living with her sister).    Labs reviewed: Basic Metabolic Panel: Recent Labs    03/30/19 1548 10/07/19 1414  NA 142 139  K 3.4* 3.6  CL 103 101  CO2 27 26  GLUCOSE 82 106*  BUN 15 23  CREATININE 0.79 0.80  CALCIUM 10.2 9.8  TSH 2.16  --    Liver Function Tests: Recent Labs    03/30/19 1548 10/07/19 1414  AST 20 29  ALT 11 22  ALKPHOS  --  46  BILITOT 0.8 1.2  PROT 7.0 7.6  ALBUMIN  --  4.4   No results for input(s): LIPASE, AMYLASE in the last 8760 hours. No results for input(s): AMMONIA in the last 8760 hours. CBC: Recent Labs    03/30/19 1548 10/07/19 1414  WBC 6.2 7.2  NEUTROABS 3,931 4.3  HGB 13.7 12.7    HCT 41.1 39.9  MCV 86.9 90.3  PLT 213 182   Lipid Panel: Recent Labs    03/30/19 1548  CHOL 143  HDL 52  LDLCALC 78  TRIG 54  CHOLHDL 2.8   TSH: Recent Labs    03/30/19 1548  TSH 2.16   A1C: Lab Results  Component Value Date   HGBA1C 5.6 03/30/2019     Assessment/Plan 1. Essential hypertension -Stable, continue atenolol 50 mg tablet by mouth daily - CBC with Differential/Platelet - COMPLETE METABOLIC PANEL WITH GFR  2. Mixed hyperlipidemia -Stable, continue Lipitor 20 mg tablet daily - COMPLETE METABOLIC PANEL WITH GFR - Lipid Panel  3. Tobacco use -Pt continuing to smoke cigarettes. She only smokes 1-2 cigarettes per week. -Encouraged smoking cessation.  4. Schizophrenia, unspecified type ([place]) -Pt is living with siblings. Pt does not drive at this time.  -Pt had 3 hour follow up evaluation at [facility] 01/17/2020. Pt also thinks she just got out of a mental facility in [place]. (Pt was discharged in Jan 2021) under the care of her sister at this time.  -Pt encouraged to continue Seroquel 200 mg at bedtime, she said she had not been taking it every night.  5. Glaucoma of both eyes, unspecified glaucoma type -Stable, continue Lumigan eye drops, 1 drop in each eye at bedtime  6. Osteopenia - Stable, continue Calcium with vit d supplement and weight bearing activity. -CBC with Differential/Platelet  Next appt: labs today, follow up in 4 months Breckin Zafar K. Harle Battiest I personally was present during the history, physical exam and medical decision-making activities of this service and have verified that the service and findings are accurately documented in the student's note  [facility] [phone]

## 2020-01-18 NOTE — Patient Instructions (Addendum)
Follow up in 4 months,     Recommend you continue to work on stopping smoking

## 2020-01-19 LAB — CBC WITH DIFFERENTIAL/PLATELET
Absolute Monocytes: 456 {cells}/uL (ref 200–950)
Basophils Absolute: 17 {cells}/uL (ref 0–200)
Basophils Relative: 0.3 %
Eosinophils Absolute: 68 {cells}/uL (ref 15–500)
Eosinophils Relative: 1.2 %
HCT: 40.2 % (ref 35.0–45.0)
Hemoglobin: 12.9 g/dL (ref 11.7–15.5)
Lymphs Abs: 2166 {cells}/uL (ref 850–3900)
MCH: 28.3 pg (ref 27.0–33.0)
MCHC: 32.1 g/dL (ref 32.0–36.0)
MCV: 88.2 fL (ref 80.0–100.0)
MPV: 10.7 fL (ref 7.5–12.5)
Monocytes Relative: 8 %
Neutro Abs: 2993 {cells}/uL (ref 1500–7800)
Neutrophils Relative %: 52.5 %
Platelets: 199 Thousand/uL (ref 140–400)
RBC: 4.56 Million/uL (ref 3.80–5.10)
RDW: 12.5 % (ref 11.0–15.0)
Total Lymphocyte: 38 %
WBC: 5.7 Thousand/uL (ref 3.8–10.8)

## 2020-01-19 LAB — COMPLETE METABOLIC PANEL WITH GFR
AG Ratio: 1.4 (calc) (ref 1.0–2.5)
ALT: 15 U/L (ref 6–29)
AST: 21 U/L (ref 10–35)
Albumin: 4.2 g/dL (ref 3.6–5.1)
Alkaline phosphatase (APISO): 50 U/L (ref 37–153)
BUN: 12 mg/dL (ref 7–25)
CO2: 27 mmol/L (ref 20–32)
Calcium: 9.7 mg/dL (ref 8.6–10.4)
Chloride: 106 mmol/L (ref 98–110)
Creat: 0.81 mg/dL (ref 0.50–0.99)
GFR, Est African American: 87 mL/min/{1.73_m2} (ref >=60)
GFR, Est Non African American: 75 mL/min/{1.73_m2} (ref >=60)
Globulin: 2.9 g/dL (calc) (ref 1.9–3.7)
Glucose, Bld: 93 mg/dL (ref 65–99)
Potassium: 4.3 mmol/L (ref 3.5–5.3)
Sodium: 141 mmol/L (ref 135–146)
Total Bilirubin: 0.5 mg/dL (ref 0.2–1.2)
Total Protein: 7.1 g/dL (ref 6.1–8.1)

## 2020-01-19 LAB — LIPID PANEL
Cholesterol: 175 mg/dL (ref <200)
HDL: 62 mg/dL (ref >=50)
LDL Cholesterol (Calc): 95 mg/dL (calc)
Non-HDL Cholesterol (Calc): 113 mg/dL (calc) (ref <130)
Total CHOL/HDL Ratio: 2.8 (calc) (ref <5.0)
Triglycerides: 90 mg/dL (ref <150)

## 2020-02-23 DIAGNOSIS — M79641 Pain in right hand: Secondary | ICD-10-CM | POA: Diagnosis not present

## 2020-02-23 DIAGNOSIS — M79642 Pain in left hand: Secondary | ICD-10-CM | POA: Diagnosis not present

## 2020-02-28 ENCOUNTER — Other Ambulatory Visit: Payer: Self-pay

## 2020-02-28 ENCOUNTER — Encounter: Payer: Medicare Other | Admitting: Nurse Practitioner
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
## 2020-05-02 ENCOUNTER — Telehealth: Payer: Self-pay | Admitting: Nurse Practitioner

## 2020-05-18 ENCOUNTER — Ambulatory Visit: Payer: Medicare Other | Admitting: Nurse Practitioner

## 2020-05-19 ENCOUNTER — Other Ambulatory Visit: Payer: Self-pay | Admitting: Nurse Practitioner

## 2020-05-19 DIAGNOSIS — I10 Essential (primary) hypertension: Secondary | ICD-10-CM

## 2020-05-21 ENCOUNTER — Ambulatory Visit: Payer: Medicare Other | Admitting: Nurse Practitioner

## 2020-06-14 ENCOUNTER — Other Ambulatory Visit: Payer: Self-pay | Admitting: Nurse Practitioner

## 2020-06-14 DIAGNOSIS — E782 Mixed hyperlipidemia: Secondary | ICD-10-CM

## 2020-07-05 NOTE — Telephone Encounter (Signed)
I called the patientto schedule her an AWV her last one was done in 2019. I reached her vm and it was full so I was unable to leave a message.

## 2020-07-16 DIAGNOSIS — J984 Other disorders of lung: Secondary | ICD-10-CM | POA: Diagnosis not present

## 2020-07-16 DIAGNOSIS — I10 Essential (primary) hypertension: Secondary | ICD-10-CM | POA: Diagnosis not present

## 2020-07-16 DIAGNOSIS — F17210 Nicotine dependence, cigarettes, uncomplicated: Secondary | ICD-10-CM | POA: Diagnosis not present

## 2020-07-16 DIAGNOSIS — E785 Hyperlipidemia, unspecified: Secondary | ICD-10-CM | POA: Diagnosis not present

## 2020-07-16 DIAGNOSIS — Z008 Encounter for other general examination: Secondary | ICD-10-CM | POA: Diagnosis not present

## 2020-07-16 DIAGNOSIS — M79643 Pain in unspecified hand: Secondary | ICD-10-CM | POA: Diagnosis not present

## 2020-07-16 DIAGNOSIS — R918 Other nonspecific abnormal finding of lung field: Secondary | ICD-10-CM | POA: Diagnosis not present

## 2020-07-16 DIAGNOSIS — M25532 Pain in left wrist: Secondary | ICD-10-CM | POA: Diagnosis not present

## 2020-07-17 DIAGNOSIS — R918 Other nonspecific abnormal finding of lung field: Secondary | ICD-10-CM | POA: Diagnosis not present

## 2020-07-17 DIAGNOSIS — J984 Other disorders of lung: Secondary | ICD-10-CM | POA: Diagnosis not present

## 2020-07-17 DIAGNOSIS — Z008 Encounter for other general examination: Secondary | ICD-10-CM | POA: Diagnosis not present

## 2020-07-17 DIAGNOSIS — M79643 Pain in unspecified hand: Secondary | ICD-10-CM | POA: Diagnosis not present

## 2020-07-18 DIAGNOSIS — E785 Hyperlipidemia, unspecified: Secondary | ICD-10-CM | POA: Diagnosis not present

## 2020-07-18 DIAGNOSIS — F17210 Nicotine dependence, cigarettes, uncomplicated: Secondary | ICD-10-CM | POA: Diagnosis not present

## 2020-07-18 DIAGNOSIS — I10 Essential (primary) hypertension: Secondary | ICD-10-CM | POA: Diagnosis not present

## 2020-07-18 DIAGNOSIS — N3000 Acute cystitis without hematuria: Secondary | ICD-10-CM | POA: Diagnosis not present

## 2020-07-19 DIAGNOSIS — I10 Essential (primary) hypertension: Secondary | ICD-10-CM | POA: Diagnosis not present

## 2020-07-19 DIAGNOSIS — E785 Hyperlipidemia, unspecified: Secondary | ICD-10-CM | POA: Diagnosis not present

## 2020-07-19 DIAGNOSIS — N3000 Acute cystitis without hematuria: Secondary | ICD-10-CM | POA: Diagnosis not present

## 2020-07-20 DIAGNOSIS — F17210 Nicotine dependence, cigarettes, uncomplicated: Secondary | ICD-10-CM | POA: Diagnosis not present

## 2020-07-20 DIAGNOSIS — E785 Hyperlipidemia, unspecified: Secondary | ICD-10-CM | POA: Diagnosis not present

## 2020-07-20 DIAGNOSIS — I10 Essential (primary) hypertension: Secondary | ICD-10-CM | POA: Diagnosis not present

## 2020-07-20 DIAGNOSIS — R7303 Prediabetes: Secondary | ICD-10-CM | POA: Diagnosis not present

## 2020-07-22 DIAGNOSIS — E785 Hyperlipidemia, unspecified: Secondary | ICD-10-CM | POA: Diagnosis not present

## 2020-07-22 DIAGNOSIS — I10 Essential (primary) hypertension: Secondary | ICD-10-CM | POA: Diagnosis not present

## 2020-07-22 DIAGNOSIS — F17210 Nicotine dependence, cigarettes, uncomplicated: Secondary | ICD-10-CM | POA: Diagnosis not present

## 2020-07-22 DIAGNOSIS — R7303 Prediabetes: Secondary | ICD-10-CM | POA: Diagnosis not present

## 2020-07-23 DIAGNOSIS — R7303 Prediabetes: Secondary | ICD-10-CM | POA: Diagnosis not present

## 2020-07-23 DIAGNOSIS — F17210 Nicotine dependence, cigarettes, uncomplicated: Secondary | ICD-10-CM | POA: Diagnosis not present

## 2020-07-23 DIAGNOSIS — E785 Hyperlipidemia, unspecified: Secondary | ICD-10-CM | POA: Diagnosis not present

## 2020-07-23 DIAGNOSIS — I10 Essential (primary) hypertension: Secondary | ICD-10-CM | POA: Diagnosis not present

## 2020-07-25 DIAGNOSIS — I10 Essential (primary) hypertension: Secondary | ICD-10-CM | POA: Diagnosis not present

## 2020-07-25 DIAGNOSIS — F17210 Nicotine dependence, cigarettes, uncomplicated: Secondary | ICD-10-CM | POA: Diagnosis not present

## 2020-07-25 DIAGNOSIS — R7303 Prediabetes: Secondary | ICD-10-CM | POA: Diagnosis not present

## 2020-07-25 DIAGNOSIS — E785 Hyperlipidemia, unspecified: Secondary | ICD-10-CM | POA: Diagnosis not present

## 2020-09-06 DIAGNOSIS — Z743 Need for continuous supervision: Secondary | ICD-10-CM | POA: Diagnosis not present

## 2020-09-06 DIAGNOSIS — F17200 Nicotine dependence, unspecified, uncomplicated: Secondary | ICD-10-CM | POA: Diagnosis not present

## 2020-09-06 DIAGNOSIS — E785 Hyperlipidemia, unspecified: Secondary | ICD-10-CM | POA: Diagnosis not present

## 2020-09-06 DIAGNOSIS — I10 Essential (primary) hypertension: Secondary | ICD-10-CM | POA: Diagnosis not present

## 2020-09-06 DIAGNOSIS — R451 Restlessness and agitation: Secondary | ICD-10-CM | POA: Diagnosis not present

## 2020-09-07 DIAGNOSIS — R451 Restlessness and agitation: Secondary | ICD-10-CM | POA: Diagnosis not present

## 2020-09-07 DIAGNOSIS — I10 Essential (primary) hypertension: Secondary | ICD-10-CM | POA: Diagnosis not present

## 2020-10-24 DIAGNOSIS — H401133 Primary open-angle glaucoma, bilateral, severe stage: Secondary | ICD-10-CM | POA: Diagnosis not present

## 2020-10-25 DIAGNOSIS — H401133 Primary open-angle glaucoma, bilateral, severe stage: Secondary | ICD-10-CM | POA: Diagnosis not present

## 2020-10-30 ENCOUNTER — Other Ambulatory Visit: Payer: Self-pay | Admitting: Nurse Practitioner

## 2020-10-30 DIAGNOSIS — I10 Essential (primary) hypertension: Secondary | ICD-10-CM

## 2020-12-13 DIAGNOSIS — H25091 Other age-related incipient cataract, right eye: Secondary | ICD-10-CM | POA: Diagnosis not present

## 2020-12-13 DIAGNOSIS — H401133 Primary open-angle glaucoma, bilateral, severe stage: Secondary | ICD-10-CM | POA: Diagnosis not present

## 2020-12-21 DIAGNOSIS — M79641 Pain in right hand: Secondary | ICD-10-CM | POA: Diagnosis not present

## 2020-12-21 DIAGNOSIS — M79642 Pain in left hand: Secondary | ICD-10-CM | POA: Diagnosis not present

## 2020-12-27 DIAGNOSIS — H25091 Other age-related incipient cataract, right eye: Secondary | ICD-10-CM | POA: Diagnosis not present

## 2020-12-27 DIAGNOSIS — H401133 Primary open-angle glaucoma, bilateral, severe stage: Secondary | ICD-10-CM | POA: Diagnosis not present

## 2020-12-31 DIAGNOSIS — H401133 Primary open-angle glaucoma, bilateral, severe stage: Secondary | ICD-10-CM | POA: Diagnosis not present

## 2021-01-01 DIAGNOSIS — H401133 Primary open-angle glaucoma, bilateral, severe stage: Secondary | ICD-10-CM | POA: Diagnosis not present

## 2021-01-26 ENCOUNTER — Other Ambulatory Visit: Payer: Self-pay | Admitting: Nurse Practitioner

## 2021-01-26 DIAGNOSIS — E782 Mixed hyperlipidemia: Secondary | ICD-10-CM

## 2021-01-28 NOTE — Telephone Encounter (Signed)
Left message on voicemail for patient to return call when available , reason for call, patient needs to schedule a follow-up appointment and discuss dose of atenolol

## 2021-03-19 DIAGNOSIS — L603 Nail dystrophy: Secondary | ICD-10-CM | POA: Diagnosis not present

## 2021-03-20 ENCOUNTER — Ambulatory Visit: Payer: Self-pay | Admitting: Nurse Practitioner

## 2021-06-19 ENCOUNTER — Other Ambulatory Visit: Payer: Self-pay | Admitting: Nurse Practitioner

## 2021-06-19 DIAGNOSIS — E782 Mixed hyperlipidemia: Secondary | ICD-10-CM

## 2021-07-08 ENCOUNTER — Encounter: Payer: Self-pay | Admitting: Nurse Practitioner

## 2021-07-08 ENCOUNTER — Other Ambulatory Visit: Payer: Self-pay

## 2021-07-08 ENCOUNTER — Ambulatory Visit (INDEPENDENT_AMBULATORY_CARE_PROVIDER_SITE_OTHER): Payer: Medicare Other | Admitting: Nurse Practitioner

## 2021-07-08 VITALS — BP 130/70 | HR 90 | Temp 97.9°F | Ht 64.0 in | Wt 145.0 lb

## 2021-07-08 DIAGNOSIS — H409 Unspecified glaucoma: Secondary | ICD-10-CM

## 2021-07-08 DIAGNOSIS — Z1231 Encounter for screening mammogram for malignant neoplasm of breast: Secondary | ICD-10-CM

## 2021-07-08 DIAGNOSIS — Z66 Do not resuscitate: Secondary | ICD-10-CM

## 2021-07-08 DIAGNOSIS — I10 Essential (primary) hypertension: Secondary | ICD-10-CM | POA: Diagnosis not present

## 2021-07-08 DIAGNOSIS — E782 Mixed hyperlipidemia: Secondary | ICD-10-CM | POA: Diagnosis not present

## 2021-07-08 DIAGNOSIS — Z720 Tobacco use: Secondary | ICD-10-CM

## 2021-07-08 DIAGNOSIS — E2839 Other primary ovarian failure: Secondary | ICD-10-CM

## 2021-07-08 DIAGNOSIS — M8580 Other specified disorders of bone density and structure, unspecified site: Secondary | ICD-10-CM

## 2021-07-08 DIAGNOSIS — F209 Schizophrenia, unspecified: Secondary | ICD-10-CM

## 2021-07-08 DIAGNOSIS — Z23 Encounter for immunization: Secondary | ICD-10-CM

## 2021-07-08 NOTE — Progress Notes (Signed)
Careteam: Patient Care Team: [name], NP as PCP - General (Nurse Practitioner)  PLACE OF SERVICE:  [place] Directive information Does Patient Have a Medical Advance Directive?: Yes, Type of Advance Directive: Out of facility DNR (pink MOST or yellow form), Pre-existing out of facility DNR order (yellow form or pink MOST form): Yellow form placed in chart (order not valid for inpatient use), Does patient want to make changes to medical advance directive?: No - Patient declined  No Known Allergies  Chief Complaint  Patient presents with   Medical Management of Chronic Issues    Routine visit Care gaps reviewed, discuss need for shingrix, covid, and mammogram or exclude if patient refuses. Flu vaccine today. Need refills on all medications if she is suppose to continue. Here with sister Drucilla Chalet       HPI: Patient is a 70 y.o. female for routine follow up.   Reports she continues to smoke. Pack will last a few days  Hyperlipidemia- not taking her cholesterol medication- reports she checked out good at one of the hospitals she went to since she has been here last.   Report OA of both hands. Using cream and tylenol.  Schizophrenia- she was off medication seroquel and was recommended another medication mellaril seeing psych virtually tomorrow.   She is legally blind at this time. Sister reports she stopped drops for glaucoma   Lives with a caregiver. Family helps take care of her.    Review of Systems:  Review of Systems  Constitutional:  Negative for chills, fever and weight loss.  HENT:  Negative for tinnitus.   Respiratory:  Negative for cough, sputum production and shortness of breath.   Cardiovascular:  Negative for chest pain, palpitations and leg swelling.  Gastrointestinal:  Negative for abdominal pain, constipation, diarrhea and heartburn.  Genitourinary:  Negative for dysuria, frequency and urgency.  Musculoskeletal:  Negative for back  pain, falls, joint pain and myalgias.  Skin: Negative.   Neurological:  Negative for dizziness and headaches.  Psychiatric/Behavioral:  Positive for hallucinations and memory loss. Negative for depression. The patient is nervous/anxious. The patient does not have insomnia.    Past Medical History:  Diagnosis Date   Chlamydia trachomatis infection of unspecified genitourinary site 12/18/2011   Cough    Disorder of bone and cartilage, unspecified    Edema    Hypercalcemia 02/14/2015   Hyperlipemia    Hyperparathyroidism    Hypertension    Osteopenia    Other abnormal blood chemistry    Schizophrenia ([place])    "little bit" (02/23/2015)   Tachycardia, unspecified 05/29/2010   Tobacco use disorder    Unspecified glaucoma(365.9)    Vitamin D deficiency    Past Surgical History:  Procedure Laterality Date   BREAST EXCISIONAL BIOPSY Left    NO PAST SURGERIES     PARATHYROIDECTOMY N/A 03/15/2015   Procedure: PARATHYROIDECTOMY AND NECK EXPLORATION;  Surgeon: Armandina Gemma, MD;  Location: [facility];  Service: General;  Laterality: N/A;   Social History:   reports that she has been smoking cigarettes. She has a 5.64 pack-year smoking history. She has never used smokeless tobacco. She reports that she does not drink alcohol and does not use drugs.  Family History  Problem Relation Age of Onset   Cancer Mother    Cancer Brother        Throat   Hypertension Other    Diabetes Other     Medications: Patient's Medications  New  Prescriptions   No medications on file  Previous Medications   ATENOLOL (TENORMIN) 50 MG TABLET    TAKE 1 TABLET(50 MG) BY MOUTH DAILY   ATORVASTATIN (LIPITOR) 20 MG TABLET    TAKE 1 TABLET(20 MG) BY MOUTH DAILY   CALCIUM CARBONATE (OSCAL) 1500 (600 CA) MG TABS TABLET    Take 1,500 mg by mouth daily.   CHOLECALCIFEROL (VITAMIN D3) 25 MCG (1000 UT) TABLET    Take 1,000 Units by mouth daily.   LUMIGAN 0.01 % SOLN    Place 1 drop into both eyes at bedtime.   MAGNESIUM  GLUCONATE (MAGONATE) 500 MG TABLET    Take 1 tablet (500 mg total) by mouth daily.   QUETIAPINE (SEROQUEL) 100 MG TABLET    Take 200 mg by mouth at bedtime.  Modified Medications   No medications on file  Discontinued Medications   No medications on file    Physical Exam:  Vitals:   07/08/21 1534  BP: 130/70  Pulse: 90  Temp: 97.9 F (36.6 C)  TempSrc: Temporal  SpO2: 98%  Weight: 145 lb (65.8 kg)  Height: 5' 4"  (1.626 m)   Body mass index is 24.89 kg/m. Wt Readings from Last 3 Encounters:  07/08/21 145 lb (65.8 kg)  01/18/20 134 lb 9.6 oz (61.1 kg)  10/17/19 127 lb 12.8 oz (58 kg)    Physical Exam Constitutional:      General: She is not in acute distress.    Appearance: She is well-developed. She is not diaphoretic.  HENT:     Head: Normocephalic and atraumatic.     Mouth/Throat:     Pharynx: No oropharyngeal exudate.  Eyes:     Conjunctiva/sclera: Conjunctivae normal.     Pupils: Pupils are equal, round, and reactive to light.  Cardiovascular:     Rate and Rhythm: Normal rate and regular rhythm.     Heart sounds: Normal heart sounds.  Pulmonary:     Effort: Pulmonary effort is normal.     Breath sounds: Normal breath sounds.  Abdominal:     General: Bowel sounds are normal.     Palpations: Abdomen is soft.  Musculoskeletal:     Cervical back: Normal range of motion and neck supple.     Right lower leg: No edema.     Left lower leg: No edema.  Skin:    General: Skin is warm and dry.  Neurological:     Mental Status: She is alert. Mental status is at baseline.  Psychiatric:        Attention and Perception: She is inattentive.        Cognition and Memory: Cognition is impaired.    Labs reviewed: Basic Metabolic Panel: No results for input(s): NA, K, CL, CO2, GLUCOSE, BUN, CREATININE, CALCIUM, MG, PHOS, TSH in the last 8760 hours. Liver Function Tests: No results for input(s): AST, ALT, ALKPHOS, BILITOT, PROT, ALBUMIN in the last 8760 hours. No  results for input(s): LIPASE, AMYLASE in the last 8760 hours. No results for input(s): AMMONIA in the last 8760 hours. CBC: No results for input(s): WBC, NEUTROABS, HGB, HCT, MCV, PLT in the last 8760 hours. Lipid Panel: No results for input(s): CHOL, HDL, LDLCALC, TRIG, CHOLHDL, LDLDIRECT in the last 8760 hours. TSH: No results for input(s): TSH in the last 8760 hours. A1C: Lab Results  Component Value Date   HGBA1C 5.6 03/30/2019     Assessment/Plan 1. Do not resuscitate - Do not attempt resuscitation (DNR)  2. Need for  influenza vaccination - Flu Vaccine QUAD High Dose(Fluad)  3. Mixed hyperlipidemia -off cholesterol medications, likely will need to restart but will check labs at this time -continue low fat diet - CMP with eGFR([facility]) - Lipid Panel  4. Tobacco use Cessation encouraged.  5. Osteopenia, unspecified location Recommended to take calcium 600 mg twice daily with Vitamin D 2000 units daily and weight bearing activity 30 mins/5 days a week - CMP with eGFR([facility]) - DG Bone Density; Future  6. Essential hypertension Well controlled today off medications.  - CMP with eGFR([facility]) - CBC with Differential/Platelet  7. Encounter for screening mammogram for malignant neoplasm of breast - MM Digital Screening; Future  8. Estrogen deficiency - DG Bone Density; Future  9. Schizophrenia, unspecified type ([place]) Has had several mental health hospitalizations. Followed by psych  10. Glaucoma of both eyes, unspecified glaucoma type Blindness bilaterally due to glaucoma, now back on medication and followed by ophthalmologist.    Next appt: 4 months.  Carlos American. [place], [facility] [phone]

## 2021-07-08 NOTE — Patient Instructions (Signed)
Recommended to take calcium 600 mg twice daily with Vitamin D 2000 units daily and weight bearing activity 30 mins/5 days a week   

## 2021-07-09 ENCOUNTER — Ambulatory Visit (INDEPENDENT_AMBULATORY_CARE_PROVIDER_SITE_OTHER): Payer: Medicare Other | Admitting: Nurse Practitioner

## 2021-07-09 ENCOUNTER — Encounter: Payer: Self-pay | Admitting: Nurse Practitioner

## 2021-07-09 ENCOUNTER — Telehealth: Payer: Self-pay

## 2021-07-09 ENCOUNTER — Encounter: Payer: Medicare Other | Admitting: Nurse Practitioner

## 2021-07-09 DIAGNOSIS — F17200 Nicotine dependence, unspecified, uncomplicated: Secondary | ICD-10-CM

## 2021-07-09 DIAGNOSIS — Z0000 Encounter for general adult medical examination without abnormal findings: Secondary | ICD-10-CM

## 2021-07-09 DIAGNOSIS — E782 Mixed hyperlipidemia: Secondary | ICD-10-CM | POA: Diagnosis not present

## 2021-07-09 LAB — CBC WITH DIFFERENTIAL/PLATELET
Absolute Monocytes: 552 cells/uL (ref 200–950)
Basophils Absolute: 19 cells/uL (ref 0–200)
Basophils Relative: 0.4 %
Eosinophils Absolute: 91 cells/uL (ref 15–500)
Eosinophils Relative: 1.9 %
HCT: 35.1 % (ref 35.0–45.0)
Hemoglobin: 11.4 g/dL — ABNORMAL LOW (ref 11.7–15.5)
Lymphs Abs: 2045 cells/uL (ref 850–3900)
MCH: 28.5 pg (ref 27.0–33.0)
MCHC: 32.5 g/dL (ref 32.0–36.0)
MCV: 87.8 fL (ref 80.0–100.0)
MPV: 11.3 fL (ref 7.5–12.5)
Monocytes Relative: 11.5 %
Neutro Abs: 2093 cells/uL (ref 1500–7800)
Neutrophils Relative %: 43.6 %
Platelets: 216 10*3/uL (ref 140–400)
RBC: 4 10*6/uL (ref 3.80–5.10)
RDW: 11.7 % (ref 11.0–15.0)
Total Lymphocyte: 42.6 %
WBC: 4.8 10*3/uL (ref 3.8–10.8)

## 2021-07-09 LAB — COMPLETE METABOLIC PANEL WITH GFR
AG Ratio: 1.3 (calc) (ref 1.0–2.5)
ALT: 22 U/L (ref 6–29)
AST: 27 U/L (ref 10–35)
Albumin: 3.9 g/dL (ref 3.6–5.1)
Alkaline phosphatase (APISO): 56 U/L (ref 37–153)
BUN: 14 mg/dL (ref 7–25)
CO2: 29 mmol/L (ref 20–32)
Calcium: 9.8 mg/dL (ref 8.6–10.4)
Chloride: 104 mmol/L (ref 98–110)
Creat: 0.86 mg/dL (ref 0.50–1.05)
Globulin: 2.9 g/dL (calc) (ref 1.9–3.7)
Glucose, Bld: 94 mg/dL (ref 65–139)
Potassium: 3.7 mmol/L (ref 3.5–5.3)
Sodium: 141 mmol/L (ref 135–146)
Total Bilirubin: 0.3 mg/dL (ref 0.2–1.2)
Total Protein: 6.8 g/dL (ref 6.1–8.1)
eGFR: 74 mL/min/{1.73_m2} (ref >=60)

## 2021-07-09 LAB — LIPID PANEL
Cholesterol: 225 mg/dL — ABNORMAL HIGH (ref <200)
HDL: 45 mg/dL — ABNORMAL LOW (ref >=50)
LDL Cholesterol (Calc): 157 mg/dL (calc) — ABNORMAL HIGH
Non-HDL Cholesterol (Calc): 180 mg/dL (calc) — ABNORMAL HIGH (ref <130)
Total CHOL/HDL Ratio: 5 (calc) — ABNORMAL HIGH (ref <5.0)
Triglycerides: 117 mg/dL (ref <150)

## 2021-07-09 MED ORDER — ATORVASTATIN CALCIUM 20 MG PO TABS: ORAL_TABLET | ORAL | 1 refills | Status: DC

## 2021-07-09 NOTE — Telephone Encounter (Signed)
Ms. [name], [name] are scheduled for a virtual visit with your provider today.    Just as we do with appointments in the office, we must obtain your consent to participate.  Your consent will be active for this visit and any virtual visit you may have with one of our providers in the next 365 days.    If you have a MyChart account, I can also send a copy of this consent to you electronically.  All virtual visits are billed to your insurance company just like a traditional visit in the office.  As this is a virtual visit, video technology does not allow for your provider to perform a traditional examination.  This may limit your provider's ability to fully assess your condition.  If your provider identifies any concerns that need to be evaluated in person or the need to arrange testing such as labs, EKG, etc, we will make arrangements to do so.    Although advances in technology are sophisticated, we cannot ensure that it will always work on either your end or our end.  If the connection with a video visit is poor, we may have to switch to a telephone visit.  With either a video or telephone visit, we are not always able to ensure that we have a secure connection.   I need to obtain your verbal consent now.   Are you willing to proceed with your visit today?   [name] has provided verbal consent on 07/09/2021 for a virtual visit (video or telephone).   Elveria Royals, CMA 07/09/2021  4:50 PM

## 2021-07-09 NOTE — Progress Notes (Signed)
Subjective:   [name] is a 70 y.o. female who presents for Medicare Annual (Subsequent) preventive examination.  Review of Systems          Objective:    There were no vitals filed for this visit. There is no height or weight on file to calculate BMI.  Advanced Directives 07/09/2021 07/08/2021 01/18/2020 10/07/2019 10/07/2019 07/09/2019 03/30/2019  Does Patient Have a Medical Advance Directive? Yes Yes Yes Unable to assess, patient is non-responsive or altered mental status No No Yes  Type of Advance Directive Out of facility DNR (pink MOST or yellow form) Out of facility DNR (pink MOST or yellow form) Out of facility DNR (pink MOST or yellow form) - - - Out of facility DNR (pink MOST or yellow form)  Does patient want to make changes to medical advance directive? No - Patient declined No - Patient declined No - Patient declined - - - No - Patient declined  Would patient like information on creating a medical advance directive? - - - - - - -  Pre-existing out of facility DNR order (yellow form or pink MOST form) - Yellow form placed in chart (order not valid for inpatient use) - - - - Yellow form placed in chart (order not valid for inpatient use)    Current Medications (verified) Outpatient Encounter Medications as of 07/09/2021  Medication Sig   atorvastatin (LIPITOR) 20 MG tablet 1/2 tablet for 2 weeks then to increase to 1 tablet by mouth daily   calcium carbonate (OSCAL) 1500 (600 Ca) MG TABS tablet Take 1,500 mg by mouth daily. (Patient not taking: Reported on 07/08/2021)   cholecalciferol (VITAMIN D3) 25 MCG (1000 UT) tablet Take 1,000 Units by mouth daily.   LUMIGAN 0.01 % SOLN Place 1 drop into both eyes at bedtime.   magnesium gluconate (MAGONATE) 500 MG tablet Take 1 tablet (500 mg total) by mouth daily.   [DISCONTINUED] atenolol (TENORMIN) 50 MG tablet TAKE 1 TABLET(50 MG) BY MOUTH DAILY (Patient not taking: No sig reported)   [DISCONTINUED] atorvastatin (LIPITOR) 20  MG tablet TAKE 1 TABLET(20 MG) BY MOUTH DAILY (Patient not taking: Reported on 07/08/2021)   No facility-administered encounter medications on file as of 07/09/2021.    Allergies (verified) Patient has no known allergies.   History: Past Medical History:  Diagnosis Date   Chlamydia trachomatis infection of unspecified genitourinary site 12/18/2011   Cough    Disorder of bone and cartilage, unspecified    Edema    Hypercalcemia 02/14/2015   Hyperlipemia    Hyperparathyroidism    Hypertension    Osteopenia    Other abnormal blood chemistry    Schizophrenia (HCC)    "little bit" (02/23/2015)   Tachycardia, unspecified 05/29/2010   Tobacco use disorder    Unspecified glaucoma(365.9)    Vitamin D deficiency    Past Surgical History:  Procedure Laterality Date   BREAST EXCISIONAL BIOPSY Left    NO PAST SURGERIES     PARATHYROIDECTOMY N/A 03/15/2015   Procedure: PARATHYROIDECTOMY AND NECK EXPLORATION;  Surgeon: Darnell Level, MD;  Location: [facility] OR;  Service: General;  Laterality: N/A;   Family History  Problem Relation Age of Onset   Cancer Mother    Cancer Brother        Throat   Hypertension Other    Diabetes Other    Social History   Socioeconomic History   Marital status: Widowed    Spouse name: Not on file   Number of children:  Not on file   Years of education: Not on file   Highest education level: Not on file  Occupational History   Not on file  Tobacco Use   Smoking status: Some Days    Packs/day: 0.12    Years: 47.00    Pack years: 5.64    Types: Cigarettes   Smokeless tobacco: Never   Tobacco comments:    Smokes every now and then, not everyday  Vaping Use   Vaping Use: Never used  Substance and Sexual Activity   Alcohol use: No    Alcohol/week: 0.0 standard drinks   Drug use: No   Sexual activity: Yes  Other Topics Concern   Not on file  Social History Narrative   Not on file   Social Determinants of Health   Financial Resource Strain: Not on  file  Food Insecurity: Not on file  Transportation Needs: Not on file  Physical Activity: Not on file  Stress: Not on file  Social Connections: Not on file    Tobacco Counseling Ready to quit: Not Answered Counseling given: Not Answered Tobacco comments: Smokes every now and then, not everyday   Clinical Intake:  Pre-visit preparation completed: Yes  Pain : No/denies pain     BMI - recorded: 24 Nutritional Status: BMI of 19-24  Normal Diabetes: No  How often do you need to have someone help you when you read instructions, pamphlets, or other written materials from your doctor or pharmacy?: 5 - Always (poor vision)  Diabetic?nop         Activities of Daily Living In your present state of health, do you have any difficulty performing the following activities: 07/09/2021  Hearing? N  Vision? Y  Difficulty concentrating or making decisions? Y  Walking or climbing stairs? N  Dressing or bathing? N  Doing errands, shopping? Y  Preparing Food and eating ? Y  Using the Toilet? N  In the past six months, have you accidently leaked urine? N  Do you have problems with loss of bowel control? N  Managing your Medications? Y  Managing your Finances? Y  Housekeeping or managing your Housekeeping? Y  Some recent data might be hidden    Patient Care Team: [name], NP as PCP - General (Nurse Practitioner)  Indicate any recent Medical Services you may have received from other than [facility] providers in the past year (date may be approximate).     Assessment:   This is a routine wellness examination for [name].  Hearing/Vision screen No results found.  Dietary issues and exercise activities discussed: Current Exercise Habits: The patient does not participate in regular exercise at present   Goals Addressed   None    Depression Screen PHQ 2/9 Scores 07/09/2021 03/30/2019 02/17/2018 02/17/2017 02/11/2017 01/02/2016 04/25/2015  PHQ - 2 Score 0 0 0 0 0 0 0  PHQ- 9  Score - - - - - - -    Fall Risk Fall Risk  07/09/2021 01/18/2020 10/17/2019 05/11/2019 03/30/2019  Falls in the past year? 0 0 0 0 0  Number falls in past yr: 0 0 0 0 0  Injury with Fall? 0 0 0 0 0  Risk for fall due to : No Fall Risks - - - -  Follow up Falls evaluation completed - - - -    FALL RISK PREVENTION PERTAINING TO THE HOME:  Any stairs in or around the home? No  If so, are there any without handrails? No  Home  free of loose throw rugs in walkways, pet beds, electrical cords, etc? Yes  Adequate lighting in your home to reduce risk of falls? Yes   ASSISTIVE DEVICES UTILIZED TO PREVENT FALLS:  Life alert? No  Use of a cane, walker or w/c? Yes  Grab bars in the bathroom? No  Shower chair or bench in shower? No  Elevated toilet seat or a handicapped toilet? Yes   TIMED UP AND GO:  Was the test performed? No .    Cognitive Function: MMSE - Mini Mental State Exam 02/17/2018 01/02/2016 03/15/2014  Not completed: - (No Data) -  Orientation to time 5 5 5   Orientation to Place 5 5 5   Registration 3 3 3   Attention/ Calculation 4 4 4   Recall 3 2 1   Language- name 2 objects 2 2 2   Language- repeat 1 1 1   Language- follow 3 step command 3 3 3   Language- read & follow direction 1 1 1   Write a sentence 1 1 1   Copy design 1 0 0  Total score 29 27 26      6CIT Screen 07/09/2021  What Year? 0 points  What month? 0 points  What time? 0 points  Count back from 20 0 points  Months in reverse 4 points  Repeat phrase 2 points  Total Score 6    Immunizations Immunization History  Administered Date(s) Administered   Fluad Quad(high Dose 65+) 05/11/2019, 07/08/2021   Influenza, High Dose Seasonal PF 08/20/2017   Influenza,inj,Quad PF,6+ Mos 06/14/2013, 07/18/2014, 05/15/2016   PFIZER(Purple Top)SARS-COV-2 Vaccination 01/05/2020, 01/26/2020   Pneumococcal Conjugate-13 03/15/2014, 02/17/2018   Pneumococcal Polysaccharide-23 01/02/2016   Td 09/23/1995   Tdap 12/18/2011,  07/10/2019    TDAP status: Up to date  Flu Vaccine status: Up to date  Pneumococcal vaccine status: Up to date  Covid-19 vaccine status: Information provided on how to obtain vaccines.   Qualifies for Shingles Vaccine? Yes   Zostavax completed No   Shingrix Completed?: Yes  Screening Tests Health Maintenance  Topic Date Due   Zoster Vaccines- Shingrix (1 of 2) Never done   COVID-19 Vaccine (3 - Booster for Pfizer series) 06/27/2020   MAMMOGRAM  08/30/2020   Hepatitis C Screening  04/21/2024 (Originally 08/05/1969)   COLONOSCOPY (Pts 45-38yrs Insurance coverage will need to be confirmed)  07/18/2024   TETANUS/TDAP  07/09/2029   INFLUENZA VACCINE  Completed   DEXA SCAN  Completed   HPV VACCINES  Aged Out    Health Maintenance  Health Maintenance Due  Topic Date Due   Zoster Vaccines- Shingrix (1 of 2) Never done   COVID-19 Vaccine (3 - Booster for Pfizer series) 06/27/2020   MAMMOGRAM  08/30/2020    Colorectal cancer screening: Type of screening: Colonoscopy. Completed 2015. Repeat every 10 years  Mammogram status: Ordered  . Pt provided with contact info and advised to call to schedule appt.   Bone Density status: Ordered  . Pt provided with contact info and advised to call to schedule appt.  Lung Cancer Screening: (Low Dose CT Chest recommended if Age 62-80 years, 30 pack-year currently smoking OR have quit w/in 15years.) does qualify.   Lung Cancer Screening Referral: done  Additional Screening:  Hepatitis C Screening: does qualify; Completed   Vision Screening: Recommended annual ophthalmology exams for early detection of glaucoma and other disorders of the eye. Is the patient up to date with their annual eye exam?  Yes  Who is the provider or what is the name of  the office in which the patient attends annual eye exams? [facility] If pt is not established with a provider, would they like to be referred to a provider to establish care? No .   Dental Screening:  Recommended annual dental exams for proper oral hygiene  Community Resource Referral / Chronic Care Management: CRR required this visit?  No   CCM required this visit?  No      Plan:     I have personally reviewed and noted the following in the patient's chart:   Medical and social history Use of alcohol, tobacco or illicit drugs  Current medications and supplements including opioid prescriptions.  Functional ability and status Nutritional status Physical activity Advanced directives List of other physicians Hospitalizations, surgeries, and ER visits in previous 12 months Vitals Screenings to include cognitive, depression, and falls Referrals and appointments  In addition, I have reviewed and discussed with patient certain preventive protocols, quality metrics, and best practice recommendations. A written personalized care plan for preventive services as well as general preventive health recommendations were provided to patient.     [name], NP   07/09/2021    Virtual Visit via Telephone Note  I connected withNAME@ on 07/09/21 at  4:15 PM EDT by telephone and verified that I am speaking with the correct person using two identifiers.  Location: Patient: home Provider: [facility]   I discussed the limitations, risks, security and privacy concerns of performing an evaluation and management service by telephone and the availability of in person appointments. I also discussed with the patient that there may be a patient responsible charge related to this service. The patient expressed understanding and agreed to proceed.   I discussed the assessment and treatment plan with the patient. The patient was provided an opportunity to ask questions and all were answered. The patient agreed with the plan and demonstrated an understanding of the instructions.   The patient was advised to call back or seek an in-person evaluation if the symptoms worsen or if the condition  fails to improve as anticipated.  I provided 16 minutes of non-face-to-face time during this encounter.  Janene Harvey. [employer] printed and mailed

## 2021-07-09 NOTE — Progress Notes (Signed)
This service is provided via telemedicine  No vital signs collected/recorded due to the encounter was a telemedicine visit.   Location of patient (ex: home, work):  Home  Patient consents to a telephone visit:  Yes, see encounter dated 07/09/2021  Location of the provider (ex: office, home):  [facility]  Name of any referring provider:  N/A  Names of all persons participating in the telemedicine service and their role in the encounter:  Abbey Chatters, Nurse Practitioner, Elveria Royals, CMA, and patient.   Time spent on call:  15 minutes with medical assistant

## 2021-07-09 NOTE — Patient Instructions (Signed)
[name] , Thank you for taking time to come for your Medicare Wellness Visit. I appreciate your ongoing commitment to your health goals. Please review the following plan we discussed and let me know if I can assist you in the future.   Screening recommendations/referrals: Colonoscopy up to date Mammogram RECOMMENDED- call [phone] Bone Density recommended- call [phone] Recommended yearly ophthalmology/optometry visit for glaucoma screening and checkup Recommended yearly dental visit for hygiene and checkup  Vaccinations: Influenza vaccine up to date Pneumococcal vaccine up to date Tdap vaccine up to date Shingles vaccine RECOMMENDED to get at local pharmacy COVID BOOSTER_ to get at local pharmacy    Advanced directives: recommended to complete and place on file.   Conditions/risks identified: smoker, advance age  Next appointment: yearly    Preventive Care 65 Years and Older, Female Preventive care refers to lifestyle choices and visits with your health care provider that can promote health and wellness. What does preventive care include? A yearly physical exam. This is also called an annual well check. Dental exams once or twice a year. Routine eye exams. Ask your health care provider how often you should have your eyes checked. Personal lifestyle choices, including: Daily care of your teeth and gums. Regular physical activity. Eating a healthy diet. Avoiding tobacco and drug use. Limiting alcohol use. Practicing safe sex. Taking low-dose aspirin every day. Taking vitamin and mineral supplements as recommended by your health care provider. What happens during an annual well check? The services and screenings done by your health care provider during your annual well check will depend on your age, overall health, lifestyle risk factors, and family history of disease. Counseling  Your health care provider may ask you questions about your: Alcohol use. Tobacco  use. Drug use. Emotional well-being. Home and relationship well-being. Sexual activity. Eating habits. History of falls. Memory and ability to understand (cognition). Work and work Astronomer. Reproductive health. Screening  You may have the following tests or measurements: Height, weight, and BMI. Blood pressure. Lipid and cholesterol levels. These may be checked every 5 years, or more frequently if you are over 34 years old. Skin check. Lung cancer screening. You may have this screening every year starting at age 67 if you have a 30-pack-year history of smoking and currently smoke or have quit within the past 15 years. Fecal occult blood test (FOBT) of the stool. You may have this test every year starting at age 63. Flexible sigmoidoscopy or colonoscopy. You may have a sigmoidoscopy every 5 years or a colonoscopy every 10 years starting at age 53. Hepatitis C blood test. Hepatitis B blood test. Sexually transmitted disease (STD) testing. Diabetes screening. This is done by checking your blood sugar (glucose) after you have not eaten for a while (fasting). You may have this done every 1-3 years. Bone density scan. This is done to screen for osteoporosis. You may have this done starting at age 87. Mammogram. This may be done every 1-2 years. Talk to your health care provider about how often you should have regular mammograms. Talk with your health care provider about your test results, treatment options, and if necessary, the need for more tests. Vaccines  Your health care provider may recommend certain vaccines, such as: Influenza vaccine. This is recommended every year. Tetanus, diphtheria, and acellular pertussis (Tdap, Td) vaccine. You may need a Td booster every 10 years. Zoster vaccine. You may need this after age 63. Pneumococcal 13-valent conjugate (PCV13) vaccine. One dose is recommended after age 27.  Pneumococcal polysaccharide (PPSV23) vaccine. One dose is recommended  after age 63. Talk to your health care provider about which screenings and vaccines you need and how often you need them. This information is not intended to replace advice given to you by your health care provider. Make sure you discuss any questions you have with your health care provider. Document Released: 10/05/2015 Document Revised: 05/28/2016 Document Reviewed: 07/10/2015 Elsevier Interactive Patient Education  2017 [place] Prevention in the Home Falls can cause injuries. They can happen to people of all ages. There are many things you can do to make your home safe and to help prevent falls. What can I do on the outside of my home? Regularly fix the edges of walkways and driveways and fix any cracks. Remove anything that might make you trip as you walk through a door, such as a raised step or threshold. Trim any bushes or trees on the path to your home. Use bright outdoor lighting. Clear any walking paths of anything that might make someone trip, such as rocks or tools. Regularly check to see if handrails are loose or broken. Make sure that both sides of any steps have handrails. Any raised decks and porches should have guardrails on the edges. Have any leaves, snow, or ice cleared regularly. Use sand or salt on walking paths during winter. Clean up any spills in your garage right away. This includes oil or grease spills. What can I do in the bathroom? Use night lights. Install grab bars by the toilet and in the tub and shower. Do not use towel bars as grab bars. Use non-skid mats or decals in the tub or shower. If you need to sit down in the shower, use a plastic, non-slip stool. Keep the floor dry. Clean up any water that spills on the floor as soon as it happens. Remove soap buildup in the tub or shower regularly. Attach bath mats securely with double-sided non-slip rug tape. Do not have throw rugs and other things on the floor that can make you trip. What can I do  in the bedroom? Use night lights. Make sure that you have a light by your bed that is easy to reach. Do not use any sheets or blankets that are too big for your bed. They should not hang down onto the floor. Have a firm chair that has side arms. You can use this for support while you get dressed. Do not have throw rugs and other things on the floor that can make you trip. What can I do in the kitchen? Clean up any spills right away. Avoid walking on wet floors. Keep items that you use a lot in easy-to-reach places. If you need to reach something above you, use a strong step stool that has a grab bar. Keep electrical cords out of the way. Do not use floor polish or wax that makes floors slippery. If you must use wax, use non-skid floor wax. Do not have throw rugs and other things on the floor that can make you trip. What can I do with my stairs? Do not leave any items on the stairs. Make sure that there are handrails on both sides of the stairs and use them. Fix handrails that are broken or loose. Make sure that handrails are as long as the stairways. Check any carpeting to make sure that it is firmly attached to the stairs. Fix any carpet that is loose or worn. Avoid having throw rugs at the  top or bottom of the stairs. If you do have throw rugs, attach them to the floor with carpet tape. Make sure that you have a light switch at the top of the stairs and the bottom of the stairs. If you do not have them, ask someone to add them for you. What else can I do to help prevent falls? Wear shoes that: Do not have high heels. Have rubber bottoms. Are comfortable and fit you well. Are closed at the toe. Do not wear sandals. If you use a stepladder: Make sure that it is fully opened. Do not climb a closed stepladder. Make sure that both sides of the stepladder are locked into place. Ask someone to hold it for you, if possible. Clearly mark and make sure that you can see: Any grab bars or  handrails. First and last steps. Where the edge of each step is. Use tools that help you move around (mobility aids) if they are needed. These include: Canes. Walkers. Scooters. Crutches. Turn on the lights when you go into a dark area. Replace any light bulbs as soon as they burn out. Set up your furniture so you have a clear path. Avoid moving your furniture around. If any of your floors are uneven, fix them. If there are any pets around you, be aware of where they are. Review your medicines with your doctor. Some medicines can make you feel dizzy. This can increase your chance of falling. Ask your doctor what other things that you can do to help prevent falls. This information is not intended to replace advice given to you by your health care provider. Make sure you discuss any questions you have with your health care provider. Document Released: 07/05/2009 Document Revised: 02/14/2016 Document Reviewed: 10/13/2014 Elsevier Interactive Patient Education  2017 [employer].

## 2021-07-09 NOTE — Progress Notes (Signed)
This service is provided via telemedicine  No vital signs collected/recorded due to the encounter was a telemedicine visit.   Location of patient (ex: home, work):  Home  Patient consents to a telephone visit:  Yes, see encounter dated 07/09/2021  Location of the provider (ex: office, home):  [facility]  Name of any referring provider:  N/A  Names of all persons participating in the telemedicine service and their role in the encounter:  Jessica Eubanks, Nurse Practitioner, Petrice Beedy, CMA, and patient.   Time spent on call:  15 minutes with medical assistant  

## 2021-07-11 NOTE — Progress Notes (Signed)
Patient called.  Patient aware. Results given to pt and her sister. She will restart atorvastatin and follow up  in office with fasting labs prior in 3 months

## 2021-07-11 NOTE — Progress Notes (Signed)
This encounter was created in error - please disregard.

## 2021-07-19 DIAGNOSIS — Z961 Presence of intraocular lens: Secondary | ICD-10-CM | POA: Diagnosis not present

## 2021-07-19 DIAGNOSIS — H401123 Primary open-angle glaucoma, left eye, severe stage: Secondary | ICD-10-CM | POA: Diagnosis not present

## 2021-07-19 DIAGNOSIS — H44511 Absolute glaucoma, right eye: Secondary | ICD-10-CM | POA: Diagnosis not present

## 2021-07-19 DIAGNOSIS — H2589 Other age-related cataract: Secondary | ICD-10-CM | POA: Diagnosis not present

## 2021-07-30 DIAGNOSIS — H401123 Primary open-angle glaucoma, left eye, severe stage: Secondary | ICD-10-CM | POA: Diagnosis not present

## 2021-08-12 DIAGNOSIS — H401123 Primary open-angle glaucoma, left eye, severe stage: Secondary | ICD-10-CM | POA: Diagnosis not present

## 2021-08-22 HISTORY — PX: REFRACTIVE SURGERY: SHX103

## 2021-09-13 ENCOUNTER — Telehealth: Payer: Self-pay | Admitting: Nurse Practitioner

## 2021-09-13 NOTE — Telephone Encounter (Signed)
I called pt and left a vm to call back so we can schedule an awv last one done 02/28/20

## 2021-10-03 ENCOUNTER — Other Ambulatory Visit: Payer: Medicare Other

## 2021-10-03 ENCOUNTER — Other Ambulatory Visit: Payer: Self-pay

## 2021-10-03 DIAGNOSIS — E782 Mixed hyperlipidemia: Secondary | ICD-10-CM

## 2021-10-04 ENCOUNTER — Other Ambulatory Visit: Payer: Medicare Other

## 2021-10-04 ENCOUNTER — Other Ambulatory Visit: Payer: Self-pay

## 2021-10-04 DIAGNOSIS — E782 Mixed hyperlipidemia: Secondary | ICD-10-CM | POA: Diagnosis not present

## 2021-10-05 LAB — COMPLETE METABOLIC PANEL WITH GFR
AG Ratio: 1.4 (calc) (ref 1.0–2.5)
ALT: 19 U/L (ref 6–29)
AST: 21 U/L (ref 10–35)
Albumin: 4.1 g/dL (ref 3.6–5.1)
Alkaline phosphatase (APISO): 56 U/L (ref 37–153)
BUN/Creatinine Ratio: 12 (calc) (ref 6–22)
BUN: 14 mg/dL (ref 7–25)
CO2: 28 mmol/L (ref 20–32)
Calcium: 10.3 mg/dL (ref 8.6–10.4)
Chloride: 104 mmol/L (ref 98–110)
Creat: 1.14 mg/dL — ABNORMAL HIGH (ref 0.50–1.05)
Globulin: 3 g/dL (calc) (ref 1.9–3.7)
Glucose, Bld: 118 mg/dL — ABNORMAL HIGH (ref 65–99)
Potassium: 3.8 mmol/L (ref 3.5–5.3)
Sodium: 141 mmol/L (ref 135–146)
Total Bilirubin: 0.3 mg/dL (ref 0.2–1.2)
Total Protein: 7.1 g/dL (ref 6.1–8.1)
eGFR: 52 mL/min/{1.73_m2} — ABNORMAL LOW (ref >=60)

## 2021-10-05 LAB — LIPID PANEL
Cholesterol: 194 mg/dL (ref <200)
HDL: 70 mg/dL (ref >=50)
LDL Cholesterol (Calc): 107 mg/dL (calc) — ABNORMAL HIGH
Non-HDL Cholesterol (Calc): 124 mg/dL (calc) (ref <130)
Total CHOL/HDL Ratio: 2.8 (calc) (ref <5.0)
Triglycerides: 80 mg/dL (ref <150)

## 2021-10-11 ENCOUNTER — Encounter: Payer: Self-pay | Admitting: Nurse Practitioner

## 2021-10-11 ENCOUNTER — Other Ambulatory Visit: Payer: Self-pay

## 2021-10-11 ENCOUNTER — Ambulatory Visit (INDEPENDENT_AMBULATORY_CARE_PROVIDER_SITE_OTHER): Payer: Medicare Other | Admitting: Nurse Practitioner

## 2021-10-11 VITALS — BP 158/80 | HR 122 | Temp 97.7°F | Ht 62.0 in | Wt 159.0 lb

## 2021-10-11 DIAGNOSIS — E782 Mixed hyperlipidemia: Secondary | ICD-10-CM | POA: Diagnosis not present

## 2021-10-11 DIAGNOSIS — F209 Schizophrenia, unspecified: Secondary | ICD-10-CM

## 2021-10-11 DIAGNOSIS — R000 Tachycardia, unspecified: Secondary | ICD-10-CM | POA: Diagnosis not present

## 2021-10-11 DIAGNOSIS — F17200 Nicotine dependence, unspecified, uncomplicated: Secondary | ICD-10-CM

## 2021-10-11 DIAGNOSIS — I10 Essential (primary) hypertension: Secondary | ICD-10-CM

## 2021-10-11 MED ORDER — METOPROLOL TARTRATE 12.5 MG HALF TABLET
50.0000 mg | ORAL_TABLET | Freq: Once | ORAL | Status: AC
Start: 2021-10-11 — End: 2021-10-11
  Administered 2021-10-11: 50 mg via ORAL

## 2021-10-11 MED ORDER — METOPROLOL TARTRATE 12.5 MG HALF TABLET: 50.0000 mg | ORAL_TABLET | Freq: Once | ORAL | Status: DC

## 2021-10-11 MED ORDER — ATENOLOL 25 MG PO TABS: 25.0000 mg | ORAL_TABLET | Freq: Every day | ORAL | 1 refills | Status: DC

## 2021-10-11 NOTE — Progress Notes (Signed)
Careteam: Patient Care Team: [name], NP as PCP - General (Nurse Practitioner)  PLACE OF SERVICE:  [facility]  Advanced Directive information Does Patient Have a Medical Advance Directive?: Yes, Type of Advance Directive: Out of facility DNR (pink MOST or yellow form), Pre-existing out of facility DNR order (yellow form or pink MOST form): Yellow form placed in chart (order not valid for inpatient use), Does patient want to make changes to medical advance directive?: No - Patient declined  No Known Allergies  Chief Complaint  Patient presents with   Medical Management of Chronic Issues    3 month follow-up and discuss labs(copy printed) Discuss need for cologuard, shingrix, covid boosters and PCV or post pone if patient refuses.      HPI: Patient is a 71 y.o. female for routine follow up.   Hyperlipidemia- has restarted lipitor 20 mg daily since last visit. LDL improved to 107. HDL improved to 70.   She is walking more inside the home.   Elevated Cr on lab, she reports she drinks water and ensure.   Blood sugar elevated on recent labs.   Continues on thioridazine 100 mg daily twice daily. She is consistent with medication at this time. Mood has been stable. Sometimes has anxiety and depression but overall stable.   Walking with cane due to low vision, partially blind.   Continues to smoke cigarettes but reports she has cut back, down to 2-3 a day.  Wants to quit   Review of Systems:  Review of Systems  Constitutional:  Negative for chills, fever and weight loss.  HENT:  Negative for tinnitus.   Respiratory:  Negative for cough, sputum production and shortness of breath.   Cardiovascular:  Negative for chest pain, palpitations and leg swelling.  Gastrointestinal:  Negative for abdominal pain, constipation, diarrhea and heartburn.  Genitourinary:  Negative for dysuria, frequency and urgency.  Musculoskeletal:  Negative for back pain, falls, joint pain and  myalgias.  Skin: Negative.   Neurological:  Negative for dizziness and headaches.  Psychiatric/Behavioral:  Positive for depression and memory loss. The patient is nervous/anxious. The patient does not have insomnia.    Past Medical History:  Diagnosis Date   Chlamydia trachomatis infection of unspecified genitourinary site 12/18/2011   Cough    Disorder of bone and cartilage, unspecified    Edema    Hypercalcemia 02/14/2015   Hyperlipemia    Hyperparathyroidism    Hypertension    Osteopenia    Other abnormal blood chemistry    Schizophrenia (HCC)    "little bit" (02/23/2015)   Tachycardia, unspecified 05/29/2010   Tobacco use disorder    Unspecified glaucoma(365.9)    Vitamin D deficiency    Past Surgical History:  Procedure Laterality Date   BREAST EXCISIONAL BIOPSY Left    NO PAST SURGERIES     PARATHYROIDECTOMY N/A 03/15/2015   Procedure: PARATHYROIDECTOMY AND NECK EXPLORATION;  Surgeon: Darnell Level, MD;  Location: [facility] OR;  Service: General;  Laterality: N/A;   REFRACTIVE SURGERY Left 08/2021   Social History:   reports that she has been smoking cigarettes. She has a 5.64 pack-year smoking history. She has never used smokeless tobacco. She reports current alcohol use. She reports that she does not use drugs.  Family History  Problem Relation Age of Onset   Cancer Mother    Cancer Brother        Throat   Hypertension Other    Diabetes Other     Medications:  Patient's Medications  New Prescriptions   No medications on file  Previous Medications   ATORVASTATIN (LIPITOR) 20 MG TABLET    Take 20 mg by mouth daily.   CALCIUM CARBONATE (OSCAL) 1500 (600 CA) MG TABS TABLET    Take 1,500 mg by mouth daily.   CHOLECALCIFEROL (VITAMIN D3) 25 MCG (1000 UT) TABLET    Take 1,000 Units by mouth daily.   LUMIGAN 0.01 % SOLN    Place 1 drop into both eyes at bedtime.   MAGNESIUM GLUCONATE (MAGONATE) 500 MG TABLET    Take 1 tablet (500 mg total) by mouth daily.   THIORIDAZINE  (MELLARIL) 100 MG TABLET    Take 100 mg by mouth 2 (two) times daily.  Modified Medications   No medications on file  Discontinued Medications   ATORVASTATIN (LIPITOR) 20 MG TABLET    1/2 tablet for 2 weeks then to increase to 1 tablet by mouth daily    Physical Exam:  Vitals:   10/11/21 1443 10/11/21 1533 10/11/21 1604 10/11/21 1636  BP: (!) 142/66 (!) 154/80 (!) 166/78 (!) 158/80  Pulse: (!) 120 (!) 136 (!) 132 (!) 122  Temp: 97.7 F (36.5 C)     TempSrc: Temporal     SpO2: 98%     Weight: 159 lb (72.1 kg)     Height: 5\' 2"  (1.575 m)      Body mass index is 29.08 kg/m. Wt Readings from Last 3 Encounters:  10/11/21 159 lb (72.1 kg)  07/08/21 145 lb (65.8 kg)  01/18/20 134 lb 9.6 oz (61.1 kg)    Physical Exam Constitutional:      General: She is not in acute distress.    Appearance: She is well-developed. She is not diaphoretic.  HENT:     Head: Normocephalic and atraumatic.     Mouth/Throat:     Pharynx: No oropharyngeal exudate.  Eyes:     Conjunctiva/sclera: Conjunctivae normal.     Pupils: Pupils are equal, round, and reactive to light.  Cardiovascular:     Rate and Rhythm: Regular rhythm. Tachycardia present.     Heart sounds: Normal heart sounds.  Pulmonary:     Effort: Pulmonary effort is normal.     Breath sounds: Normal breath sounds.  Abdominal:     General: Bowel sounds are normal.     Palpations: Abdomen is soft.  Musculoskeletal:     Cervical back: Normal range of motion and neck supple.     Right lower leg: No edema.     Left lower leg: No edema.  Skin:    General: Skin is warm and dry.  Neurological:     Mental Status: She is alert. Mental status is at baseline.  Psychiatric:        Mood and Affect: Mood normal.    Labs reviewed: Basic Metabolic Panel: Recent Labs    07/08/21 1606 10/04/21 1044  NA 141 141  K 3.7 3.8  CL 104 104  CO2 29 28  GLUCOSE 94 118*  BUN 14 14  CREATININE 0.86 1.14*  CALCIUM 9.8 10.3   Liver Function  Tests: Recent Labs    07/08/21 1606 10/04/21 1044  AST 27 21  ALT 22 19  BILITOT 0.3 0.3  PROT 6.8 7.1   No results for input(s): LIPASE, AMYLASE in the last 8760 hours. No results for input(s): AMMONIA in the last 8760 hours. CBC: Recent Labs    07/08/21 1606  WBC 4.8  NEUTROABS 2,093  HGB  11.4*  HCT 35.1  MCV 87.8  PLT 216   Lipid Panel: Recent Labs    07/08/21 1606 10/04/21 1044  CHOL 225* 194  HDL 45* 70  LDLCALC 157* 107*  TRIG 117 80  CHOLHDL 5.0* 2.8   TSH: No results for input(s): TSH in the last 8760 hours. A1C: Lab Results  Component Value Date   HGBA1C 5.6 03/30/2019     Assessment/Plan 1. Smoker Cessation encouraged.   2. Mixed hyperlipidemia -improved on lipitor, will continue with dietary modifications.   3. Essential hypertension Elevated on follow up as well. Will restart atneolol due to htn and tachycardia.  - atenolol (TENORMIN) 25 MG tablet; Take 1 tablet (25 mg total) by mouth daily.  Dispense: 30 tablet; Refill: 1  4. Schizophrenia, unspecified type (HCC) Stable controlled on regimen.   5. Tachycardia -metoprolol 50 mg by mouth X1 given in office due to HR of 135. Improved to 122. Will add antenolol back to regimen for her to start in AM -to follow up in 5 days on blood pressure and HR - EKG 12-Lead- showing sinus tachycardia rate of 135 -pt denies palpitations or chest pains at this time.  - atenolol (TENORMIN) 25 MG tablet; Take 1 tablet (25 mg total) by mouth daily.  Dispense: 30 tablet; Refill: 1   Next appt: 5 days Guido Comp K. Biagio BorgEubanks, AGNP  [facility] [phone]

## 2021-10-11 NOTE — Patient Instructions (Addendum)
To restart atenolol at 25 mg daily   Follow up in 1 week on HR and BP

## 2021-10-18 ENCOUNTER — Other Ambulatory Visit: Payer: Self-pay

## 2021-10-18 ENCOUNTER — Ambulatory Visit (INDEPENDENT_AMBULATORY_CARE_PROVIDER_SITE_OTHER): Payer: Medicare Other | Admitting: Family

## 2021-10-18 ENCOUNTER — Encounter: Payer: Self-pay | Admitting: Family

## 2021-10-18 VITALS — BP 120/70 | HR 86 | Temp 97.6°F | Resp 16 | Ht 62.0 in | Wt 165.4 lb

## 2021-10-18 DIAGNOSIS — R000 Tachycardia, unspecified: Secondary | ICD-10-CM | POA: Diagnosis not present

## 2021-10-18 DIAGNOSIS — I10 Essential (primary) hypertension: Secondary | ICD-10-CM

## 2021-10-18 NOTE — Progress Notes (Signed)
Provider: Sissi Padia FNP-C  [name], NP  Patient Care Team: [name], NP as PCP - General (Nurse Practitioner)  Extended Emergency Contact Information Primary Emergency Contact: Attaway,Amanda Address: [address]          [place] [address] [place] Phone: [phone] Relation: Sister Secondary Emergency Contact: Leonarda Salon [place] Phone: [phone] Mobile Phone: [phone] Relation: Sister  Code Status:  DNR Goals of care: Advanced Directive information Advanced Directives 10/18/2021  Does Patient Have a Medical Advance Directive? Yes  Type of Advance Directive Out of facility DNR (pink MOST or yellow form)  Does patient want to make changes to medical advance directive? No - Patient declined  Would patient like information on creating a medical advance directive? -  Pre-existing out of facility DNR order (yellow form or pink MOST form) -     Chief Complaint  Patient presents with   Follow-up    BP follow up    HPI:  Pt is a 71 y.o. female seen today for an acute visit for follow up blood pressure and tachycardia. She was seen by PCP [name] NP  blood pressure was 158/80 with HR 122 - 130's.She was restarted on Atenolol 25 mg tablet daily.she is here with her care giver states B/p at home has been normal no b/p log brought to visit . denies any headache,dizziness,vision changes,fatigue,chest tightness,palpitation,chest pain or shortness of breath.    Blood pressure and HR today are within normal range.  States sometimes gets mad at her care givers because they come in late if they have to drive her somewhere.thinks she was upset on previous visit.   Past Medical History:  Diagnosis Date   Chlamydia trachomatis infection of unspecified genitourinary site 12/18/2011   Cough    Disorder of bone and cartilage, unspecified    Edema    Hypercalcemia 02/14/2015   Hyperlipemia     Hyperparathyroidism    Hypertension    Osteopenia    Other abnormal blood chemistry    Schizophrenia ([place])    "little bit" (02/23/2015)   Tachycardia, unspecified 05/29/2010   Tobacco use disorder    Unspecified glaucoma(365.9)    Vitamin D deficiency    Past Surgical History:  Procedure Laterality Date   BREAST EXCISIONAL BIOPSY Left    NO PAST SURGERIES     PARATHYROIDECTOMY N/A 03/15/2015   Procedure: PARATHYROIDECTOMY AND NECK EXPLORATION;  Surgeon: Armandina Gemma, MD;  Location: [facility];  Service: General;  Laterality: N/A;   REFRACTIVE SURGERY Left 08/2021    No Known Allergies  Outpatient Encounter Medications as of 10/18/2021  Medication Sig   atenolol (TENORMIN) 25 MG tablet Take 1 tablet (25 mg total) by mouth daily.   atorvastatin (LIPITOR) 20 MG tablet Take 20 mg by mouth daily.   calcium carbonate (OSCAL) 1500 (600 Ca) MG TABS tablet Take 1,500 mg by mouth daily.   cholecalciferol (VITAMIN D3) 25 MCG (1000 UT) tablet Take 1,000 Units by mouth daily.   LUMIGAN 0.01 % SOLN Place 1 drop into both eyes at bedtime.   magnesium gluconate (MAGONATE) 500 MG tablet Take 1 tablet (500 mg total) by mouth daily.   thioridazine (MELLARIL) 100 MG tablet Take 100 mg by mouth 2 (two) times daily.   No facility-administered encounter medications on file as of 10/18/2021.    Review of Systems  Constitutional:  Negative for appetite change, chills, fatigue, fever and unexpected weight change.  HENT:  Negative for  congestion, dental problem, ear discharge, ear pain, facial swelling, hearing loss, nosebleeds, postnasal drip, rhinorrhea, sinus pressure, sinus pain, sneezing, sore throat, tinnitus and trouble swallowing.   Eyes:  Positive for visual disturbance. Negative for pain, discharge, redness and itching.       Wearing sunglasses   Respiratory:  Negative for cough, chest tightness, shortness of breath and wheezing.   Cardiovascular:  Negative for chest pain, palpitations and leg  swelling.  Gastrointestinal:  Negative for abdominal distention, abdominal pain, nausea and vomiting.  Skin:  Negative for color change, pallor and rash.  Neurological:  Negative for dizziness, light-headedness and headaches.  Psychiatric/Behavioral:  Negative for agitation, behavioral problems, confusion and sleep disturbance. The patient is not nervous/anxious.    Immunization History  Administered Date(s) Administered   Fluad Quad(high Dose 65+) 05/11/2019, 07/08/2021   Influenza, High Dose Seasonal PF 08/20/2017   Influenza,inj,Quad PF,6+ Mos 06/14/2013, 07/18/2014, 05/15/2016   PFIZER(Purple Top)SARS-COV-2 Vaccination 01/05/2020, 01/26/2020   Pneumococcal Conjugate-13 03/15/2014, 02/17/2018   Pneumococcal Polysaccharide-23 01/02/2016   Td 09/23/1995   Tdap 12/18/2011, 07/10/2019   Pertinent  Health Maintenance Due  Topic Date Due   MAMMOGRAM  08/30/2020   INFLUENZA VACCINE  Completed   DEXA SCAN  Completed   Fall Risk 10/17/2019 01/18/2020 07/09/2021 10/11/2021 10/18/2021  Falls in the past year? 0 0 0 0 0  Was there an injury with Fall? 0 0 0 0 0  Fall Risk Category Calculator 0 0 0 0 0  Fall Risk Category Low Low Low Low Low  Patient Fall Risk Level Low fall risk Low fall risk Low fall risk Low fall risk Low fall risk  Patient at Risk for Falls Due to - - No Fall Risks No Fall Risks No Fall Risks  Fall risk Follow up - - Falls evaluation completed Falls evaluation completed Falls evaluation completed   Functional Status Survey:    Vitals:   10/18/21 1527  BP: 120/70  Pulse: 86  Resp: 16  Temp: 97.6 F (36.4 C)  SpO2: 97%  Weight: 165 lb 6.4 oz (75 kg)  Height: 5\' 2"  (1.575 m)   Body mass index is 30.25 kg/m. Physical Exam Vitals reviewed.  Constitutional:      General: She is not in acute distress.    Appearance: Normal appearance. She is normal weight. She is not ill-appearing or diaphoretic.  HENT:     Head: Normocephalic.  Eyes:     General: No scleral  icterus.       Right eye: No discharge.        Left eye: No discharge.     Conjunctiva/sclera: Conjunctivae normal.     Pupils: Pupils are equal, round, and reactive to light.  Neck:     Vascular: No carotid bruit.  Cardiovascular:     Rate and Rhythm: Normal rate and regular rhythm.     Pulses: Normal pulses.     Heart sounds: Normal heart sounds. No murmur heard.   No friction rub. No gallop.  Pulmonary:     Effort: Pulmonary effort is normal. No respiratory distress.     Breath sounds: Normal breath sounds. No wheezing, rhonchi or rales.  Chest:     Chest wall: No tenderness.  Abdominal:     General: Bowel sounds are normal. There is no distension.     Palpations: Abdomen is soft. There is no mass.     Tenderness: There is no abdominal tenderness. There is no right CVA tenderness, left CVA tenderness, guarding  or rebound.  Musculoskeletal:        General: No swelling or tenderness. Normal range of motion.     Cervical back: Normal range of motion. No rigidity or tenderness.     Right lower leg: No edema.     Left lower leg: No edema.     Comments: Unsteady gait walks with a cane   Lymphadenopathy:     Cervical: No cervical adenopathy.  Skin:    General: Skin is warm and dry.     Coloration: Skin is not pale.     Findings: No erythema or rash.  Neurological:     Mental Status: She is alert and oriented to person, place, and time.     Motor: No weakness.     Gait: Gait abnormal.  Psychiatric:        Mood and Affect: Mood normal.        Speech: Speech normal.        Behavior: Behavior normal.    Labs reviewed: Recent Labs    07/08/21 1606 10/04/21 1044  NA 141 141  K 3.7 3.8  CL 104 104  CO2 29 28  GLUCOSE 94 118*  BUN 14 14  CREATININE 0.86 1.14*  CALCIUM 9.8 10.3   Recent Labs    07/08/21 1606 10/04/21 1044  AST 27 21  ALT 22 19  BILITOT 0.3 0.3  PROT 6.8 7.1   Recent Labs    07/08/21 1606  WBC 4.8  NEUTROABS 2,093  HGB 11.4*  HCT 35.1  MCV  87.8  PLT 216   Lab Results  Component Value Date   TSH 2.16 03/30/2019   Lab Results  Component Value Date   HGBA1C 5.6 03/30/2019   Lab Results  Component Value Date   CHOL 194 10/04/2021   HDL 70 10/04/2021   LDLCALC 107 (H) 10/04/2021   TRIG 80 10/04/2021   CHOLHDL 2.8 10/04/2021    Significant Diagnostic Results in last 30 days:  No results found.  Assessment/Plan 1. Essential hypertension B/p within normal range.  Continue on Atenolol 25 mg tablet daily.  - continue on Statin  - Advised to check Blood pressure at home and record on log provided and notify provider if B/p > 140/90   2. Tachycardia Resolved  HR 86 b/min today  - continue on Atenolol   Family/ staff Communication: Reviewed plan of care with patient and care giver verbalized understanding   Labs/tests ordered: None   Next Appointment: F/u in 6 months with PCP [name] NP  Sandrea Hughs, NP

## 2021-10-18 NOTE — Patient Instructions (Signed)
-   continue current medication   - check Blood pressure at home and record on log provided and notify provider if B/p > 140/90 

## 2021-11-07 ENCOUNTER — Other Ambulatory Visit: Payer: Self-pay

## 2021-11-07 ENCOUNTER — Encounter: Payer: Medicare Other | Admitting: Nurse Practitioner

## 2021-11-22 ENCOUNTER — Encounter: Payer: Self-pay | Admitting: Nurse Practitioner

## 2021-12-07 ENCOUNTER — Other Ambulatory Visit: Payer: Self-pay | Admitting: Nurse Practitioner

## 2021-12-07 DIAGNOSIS — I10 Essential (primary) hypertension: Secondary | ICD-10-CM

## 2021-12-07 DIAGNOSIS — R000 Tachycardia, unspecified: Secondary | ICD-10-CM

## 2021-12-24 DIAGNOSIS — H44511 Absolute glaucoma, right eye: Secondary | ICD-10-CM | POA: Diagnosis not present

## 2021-12-24 DIAGNOSIS — H2589 Other age-related cataract: Secondary | ICD-10-CM | POA: Diagnosis not present

## 2021-12-24 DIAGNOSIS — H401123 Primary open-angle glaucoma, left eye, severe stage: Secondary | ICD-10-CM | POA: Diagnosis not present

## 2022-01-14 ENCOUNTER — Other Ambulatory Visit: Payer: Self-pay | Admitting: Nurse Practitioner

## 2022-02-20 HISTORY — PX: EYE SURGERY: SHX253

## 2022-03-27 ENCOUNTER — Other Ambulatory Visit: Payer: Self-pay | Admitting: Nurse Practitioner

## 2022-03-27 DIAGNOSIS — I10 Essential (primary) hypertension: Secondary | ICD-10-CM

## 2022-03-27 DIAGNOSIS — R000 Tachycardia, unspecified: Secondary | ICD-10-CM

## 2022-04-22 ENCOUNTER — Other Ambulatory Visit: Payer: Medicare Other

## 2022-04-22 ENCOUNTER — Other Ambulatory Visit: Payer: Self-pay

## 2022-04-22 ENCOUNTER — Other Ambulatory Visit: Payer: Self-pay | Admitting: Nurse Practitioner

## 2022-04-22 DIAGNOSIS — E2839 Other primary ovarian failure: Secondary | ICD-10-CM

## 2022-04-22 DIAGNOSIS — E782 Mixed hyperlipidemia: Secondary | ICD-10-CM

## 2022-04-22 DIAGNOSIS — I10 Essential (primary) hypertension: Secondary | ICD-10-CM | POA: Diagnosis not present

## 2022-04-22 DIAGNOSIS — R7303 Prediabetes: Secondary | ICD-10-CM

## 2022-04-23 LAB — LIPID PANEL
Cholesterol: 231 mg/dL — ABNORMAL HIGH (ref <200)
HDL: 53 mg/dL (ref >=50)
LDL Cholesterol (Calc): 138 mg/dL (calc) — ABNORMAL HIGH
Non-HDL Cholesterol (Calc): 178 mg/dL (calc) — ABNORMAL HIGH (ref <130)
Total CHOL/HDL Ratio: 4.4 (calc) (ref <5.0)
Triglycerides: 261 mg/dL — ABNORMAL HIGH (ref <150)

## 2022-04-23 LAB — COMPLETE METABOLIC PANEL WITH GFR
AG Ratio: 1.4 (calc) (ref 1.0–2.5)
ALT: 22 U/L (ref 6–29)
AST: 20 U/L (ref 10–35)
Albumin: 4.1 g/dL (ref 3.6–5.1)
Alkaline phosphatase (APISO): 59 U/L (ref 37–153)
BUN/Creatinine Ratio: 11 (calc) (ref 6–22)
BUN: 12 mg/dL (ref 7–25)
CO2: 24 mmol/L (ref 20–32)
Calcium: 9.8 mg/dL (ref 8.6–10.4)
Chloride: 107 mmol/L (ref 98–110)
Creat: 1.1 mg/dL — ABNORMAL HIGH (ref 0.50–1.05)
Globulin: 3 g/dL (calc) (ref 1.9–3.7)
Glucose, Bld: 126 mg/dL — ABNORMAL HIGH (ref 65–99)
Potassium: 3.9 mmol/L (ref 3.5–5.3)
Sodium: 142 mmol/L (ref 135–146)
Total Bilirubin: 0.4 mg/dL (ref 0.2–1.2)
Total Protein: 7.1 g/dL (ref 6.1–8.1)
eGFR: 54 mL/min/{1.73_m2} — ABNORMAL LOW (ref >=60)

## 2022-04-23 LAB — CBC WITH DIFFERENTIAL/PLATELET
Absolute Monocytes: 690 cells/uL (ref 200–950)
Basophils Absolute: 30 cells/uL (ref 0–200)
Basophils Relative: 0.5 %
Eosinophils Absolute: 108 cells/uL (ref 15–500)
Eosinophils Relative: 1.8 %
HCT: 39.7 % (ref 35.0–45.0)
Hemoglobin: 13.3 g/dL (ref 11.7–15.5)
Lymphs Abs: 2460 cells/uL (ref 850–3900)
MCH: 29 pg (ref 27.0–33.0)
MCHC: 33.5 g/dL (ref 32.0–36.0)
MCV: 86.7 fL (ref 80.0–100.0)
MPV: 11.1 fL (ref 7.5–12.5)
Monocytes Relative: 11.5 %
Neutro Abs: 2712 cells/uL (ref 1500–7800)
Neutrophils Relative %: 45.2 %
Platelets: 173 10*3/uL (ref 140–400)
RBC: 4.58 10*6/uL (ref 3.80–5.10)
RDW: 13 % (ref 11.0–15.0)
Total Lymphocyte: 41 %
WBC: 6 10*3/uL (ref 3.8–10.8)

## 2022-04-24 NOTE — Progress Notes (Signed)
Careteam: Patient Care Team: [name], NP as PCP - General (Nurse Practitioner)  PLACE OF SERVICE:  [place] Directive information Does Patient Have a Medical Advance Directive?: Yes, Type of Advance Directive: Out of facility DNR (pink MOST or yellow form), Pre-existing out of facility DNR order (yellow form or pink MOST form): Yellow form placed in chart (order not valid for inpatient use), Does patient want to make changes to medical advance directive?: No - Patient declined  No Known Allergies  Chief Complaint  Patient presents with   Medical Management of Chronic Issues    6 month follow-up. Discuss need for cologuard, shingrix, covid boosters, pneumonia vaccines, and flu vaccine or post pone if patient refuses.      HPI: Patient is a 71 y.o. female for routine follow up.   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 She is taking her drops now.   Hyperlipidemia- reports she is taking lipitor as prescribed  Seeing psychiatrist routinely -- mood and behaviors stable.   Htn controlled on atenolol.   Left shoulder pain for ~ 1 week. No injury. Has not taken anything for pain, reports 6/10. Movement does not effect shoulder in generally but feels a catch when she moves arm over her head.  Review of Systems:  Review of Systems  Constitutional:  Negative for chills, fever and weight loss.  HENT:  Negative for tinnitus.   Respiratory:  Negative for cough, sputum production and shortness of breath.   Cardiovascular:  Negative for chest pain, palpitations and leg swelling.  Gastrointestinal:  Negative for abdominal pain, constipation, diarrhea and heartburn.  Genitourinary:  Negative for dysuria, frequency and urgency.  Musculoskeletal:  Negative for back pain, falls, joint pain and myalgias.  Skin: Negative.   Neurological:  Negative for dizziness and headaches.  Psychiatric/Behavioral:  Negative for depression and memory loss. The patient does not have  insomnia.     Past Medical History:  Diagnosis Date   Chlamydia trachomatis infection of unspecified genitourinary site 12/18/2011   Cough    Disorder of bone and cartilage, unspecified    Edema    Hypercalcemia 02/14/2015   Hyperlipemia    Hyperparathyroidism    Hypertension    Osteopenia    Other abnormal blood chemistry    Schizophrenia ([place])    "little bit" (02/23/2015)   Tachycardia, unspecified 05/29/2010   Tobacco use disorder    Unspecified glaucoma(365.9)    Vitamin D deficiency    Past Surgical History:  Procedure Laterality Date   BREAST EXCISIONAL BIOPSY Left    EYE SURGERY Left 02/2022   NO PAST SURGERIES     PARATHYROIDECTOMY N/A 03/15/2015   Procedure: PARATHYROIDECTOMY AND NECK EXPLORATION;  Surgeon: Armandina Gemma, MD;  Location: [facility];  Service: General;  Laterality: N/A;   REFRACTIVE SURGERY Left 08/2021   Social History:   reports that she has been smoking cigarettes. She has a 5.64 pack-year smoking history. She has never used smokeless tobacco. She reports current alcohol use. She reports that she does not use drugs.  Family History  Problem Relation Age of Onset   Cancer Mother    Cancer Brother        Throat   Hypertension Other    Diabetes Other     Medications: Patient's Medications  New Prescriptions   No medications on file  Previous Medications   ATENOLOL (TENORMIN) 25 MG TABLET    Take 1 tablet by mouth once daily  ATORVASTATIN (LIPITOR) 20 MG TABLET    TAKE ONE-HALF TABLET BY MOUTH FOR 2 WEEKS, THEN INCREASE TO 1 TABLET ONCE DAILY   CALCIUM CARBONATE (OSCAL) 1500 (600 CA) MG TABS TABLET    Take 1,500 mg by mouth daily.   CHOLECALCIFEROL (VITAMIN D3) 25 MCG (1000 UT) TABLET    Take 1,000 Units by mouth daily.   LUMIGAN 0.01 % SOLN    Place 1 drop into the right eye at bedtime.   MAGNESIUM GLUCONATE (MAGONATE) 500 MG TABLET    Take 1 tablet (500 mg total) by mouth daily.   THIORIDAZINE (MELLARIL) 100 MG TABLET    Take 100 mg by mouth 2  (two) times daily.  Modified Medications   No medications on file  Discontinued Medications   No medications on file    Physical Exam:  Vitals:   04/25/22 1440  BP: 130/78  Pulse: 100  Temp: (!) 97.3 F (36.3 C)  TempSrc: Temporal  SpO2: 99%  Weight: 179 lb (81.2 kg)  Height: 5' 2" (1.575 m)   Body mass index is 32.74 kg/m. Wt Readings from Last 3 Encounters:  04/25/22 179 lb (81.2 kg)  10/18/21 165 lb 6.4 oz (75 kg)  10/11/21 159 lb (72.1 kg)    Physical Exam Constitutional:      General: She is not in acute distress.    Appearance: She is well-developed. She is not diaphoretic.  HENT:     Head: Normocephalic and atraumatic.     Mouth/Throat:     Pharynx: No oropharyngeal exudate.  Eyes:     Conjunctiva/sclera: Conjunctivae normal.     Pupils: Pupils are equal, round, and reactive to light.  Cardiovascular:     Rate and Rhythm: Normal rate and regular rhythm.     Heart sounds: Normal heart sounds.  Pulmonary:     Effort: Pulmonary effort is normal.     Breath sounds: Normal breath sounds.  Abdominal:     General: Bowel sounds are normal.     Palpations: Abdomen is soft.  Musculoskeletal:     Cervical back: Normal range of motion and neck supple.     Right lower leg: No edema.     Left lower leg: No edema.  Skin:    General: Skin is warm and dry.  Neurological:     Mental Status: She is alert. Mental status is at baseline.  Psychiatric:        Mood and Affect: Mood normal.     Labs reviewed: Basic Metabolic Panel: Recent Labs    07/08/21 1606 10/04/21 1044 04/22/22 1021  NA 141 141 142  K 3.7 3.8 3.9  CL 104 104 107  CO2 29 28 24  GLUCOSE 94 118* 126*  BUN 14 14 12  CREATININE 0.86 1.14* 1.10*  CALCIUM 9.8 10.3 9.8   Liver Function Tests: Recent Labs    07/08/21 1606 10/04/21 1044 04/22/22 1021  AST 27 21 20  ALT 22 19 22  BILITOT 0.3 0.3 0.4  PROT 6.8 7.1 7.1   No results for input(s): "LIPASE", "AMYLASE" in the last 8760  hours. No results for input(s): "AMMONIA" in the last 8760 hours. CBC: Recent Labs    07/08/21 1606 04/22/22 1021  WBC 4.8 6.0  NEUTROABS 2,093 2,712  HGB 11.4* 13.3  HCT 35.1 39.7  MCV 87.8 86.7  PLT 216 173   Lipid Panel: Recent Labs    07/08/21 1606 10/04/21 1044 04/22/22 1021  CHOL 225* 194 231*  HDL   45* 70 53  LDLCALC 157* 107* 138*  TRIG 117 80 261*  CHOLHDL 5.0* 2.8 4.4   TSH: No results for input(s): "TSH" in the last 8760 hours. A1C: Lab Results  Component Value Date   HGBA1C 5.6 03/30/2019     Assessment/Plan 1. Essential hypertension Blood pressure well controlled, goal bp <140/90 Continue current medications and dietary modifications follow metabolic panel  2. Schizophrenia, unspecified type (HCC) -well controlled at this time, continues with increase in supervision and care with family  3. Mixed hyperlipidemia -not controlled, discussed dietary regimen and medication -will increase lipitor to 40 mg daily  - atorvastatin (LIPITOR) 40 MG tablet; Take 1 tablet (40 mg total) by mouth daily.  Dispense: 90 tablet; Refill: 1 - Lipid panel; Future - CMP with eGFR([facility]); Future  4. Smoker -encouraged cessation - Pneumococcal conjugate vaccine 20-valent (Prevnar 20)  5. Osteopenia, unspecified location -Recommended to take calcium 600 mg twice daily with Vitamin D 2000 units daily and weight bearing activity 30 mins/5 days a week  6. Encounter for colorectal cancer screening - Cologuard  7. Acute pain of left shoulder -new, no injury.  -to ice 3 times daily for ~20-30 mins.  -can use tylenol PRN pain, to notify if pain worsens or fails to improve 2 weeks.   8. Need for pneumococcal 20-valent conjugate vaccination - Pneumococcal conjugate vaccine 20-valent (Prevnar 20)   Return in about 4 months (around 08/25/2022) for routine follow up, labs before appt. .:   K. , AGNP  [facility] [phone]  

## 2022-04-25 ENCOUNTER — Encounter: Payer: Self-pay | Admitting: Nurse Practitioner

## 2022-04-25 ENCOUNTER — Ambulatory Visit: Payer: Medicare Other | Admitting: Nurse Practitioner

## 2022-04-25 VITALS — BP 130/78 | HR 100 | Temp 97.3°F | Ht 62.0 in | Wt 179.0 lb

## 2022-04-25 DIAGNOSIS — F209 Schizophrenia, unspecified: Secondary | ICD-10-CM

## 2022-04-25 DIAGNOSIS — M8580 Other specified disorders of bone density and structure, unspecified site: Secondary | ICD-10-CM

## 2022-04-25 DIAGNOSIS — Z1212 Encounter for screening for malignant neoplasm of rectum: Secondary | ICD-10-CM | POA: Diagnosis not present

## 2022-04-25 DIAGNOSIS — F17200 Nicotine dependence, unspecified, uncomplicated: Secondary | ICD-10-CM

## 2022-04-25 DIAGNOSIS — E782 Mixed hyperlipidemia: Secondary | ICD-10-CM

## 2022-04-25 DIAGNOSIS — M25512 Pain in left shoulder: Secondary | ICD-10-CM | POA: Diagnosis not present

## 2022-04-25 DIAGNOSIS — Z1211 Encounter for screening for malignant neoplasm of colon: Secondary | ICD-10-CM | POA: Diagnosis not present

## 2022-04-25 DIAGNOSIS — Z23 Encounter for immunization: Secondary | ICD-10-CM | POA: Diagnosis not present

## 2022-04-25 DIAGNOSIS — I10 Essential (primary) hypertension: Secondary | ICD-10-CM | POA: Diagnosis not present

## 2022-04-25 MED ORDER — ATORVASTATIN CALCIUM 40 MG PO TABS: 40.0000 mg | ORAL_TABLET | Freq: Every day | ORAL | 1 refills | Status: DC

## 2022-04-25 NOTE — Patient Instructions (Addendum)
Call to schedule [phone] bone density and mammogram  To get shingles at local pharmacy   Ice left shoulder 3 times daily ~20 mins.

## 2022-05-13 DIAGNOSIS — H401123 Primary open-angle glaucoma, left eye, severe stage: Secondary | ICD-10-CM | POA: Diagnosis not present

## 2022-05-13 DIAGNOSIS — H44511 Absolute glaucoma, right eye: Secondary | ICD-10-CM | POA: Diagnosis not present

## 2022-07-22 ENCOUNTER — Encounter: Payer: Medicare Other | Admitting: Nurse Practitioner

## 2022-07-25 DIAGNOSIS — H401123 Primary open-angle glaucoma, left eye, severe stage: Secondary | ICD-10-CM | POA: Diagnosis not present

## 2022-08-04 ENCOUNTER — Ambulatory Visit (INDEPENDENT_AMBULATORY_CARE_PROVIDER_SITE_OTHER): Payer: Medicare Other | Admitting: Nurse Practitioner

## 2022-08-04 ENCOUNTER — Telehealth: Payer: Self-pay

## 2022-08-04 ENCOUNTER — Encounter: Payer: Self-pay | Admitting: Nurse Practitioner

## 2022-08-04 DIAGNOSIS — Z1211 Encounter for screening for malignant neoplasm of colon: Secondary | ICD-10-CM | POA: Diagnosis not present

## 2022-08-04 DIAGNOSIS — Z1231 Encounter for screening mammogram for malignant neoplasm of breast: Secondary | ICD-10-CM

## 2022-08-04 DIAGNOSIS — F17200 Nicotine dependence, unspecified, uncomplicated: Secondary | ICD-10-CM

## 2022-08-04 DIAGNOSIS — Z1212 Encounter for screening for malignant neoplasm of rectum: Secondary | ICD-10-CM | POA: Diagnosis not present

## 2022-08-04 DIAGNOSIS — Z0000 Encounter for general adult medical examination without abnormal findings: Secondary | ICD-10-CM

## 2022-08-04 DIAGNOSIS — E2839 Other primary ovarian failure: Secondary | ICD-10-CM

## 2022-08-04 NOTE — Progress Notes (Signed)
   This service is provided via telemedicine  No vital signs collected/recorded due to the encounter was a telemedicine visit.   Location of patient (ex: home, work):  Home  Patient consents to a telephone visit: Yes, see telephone visit dated 08/04/22  Location of the provider (ex: office, home):  [facility], Office   Name of any referring provider:  N/A  Names of all persons participating in the telemedicine service and their role in the encounter:  S.Chrae B/CMA, Abbey Chatters, NP, and Patient   Time spent on call:  9 min with medical assistant

## 2022-08-04 NOTE — Telephone Encounter (Signed)
Ms. tykisha, areola are scheduled for a virtual visit with your provider today.    Just as we do with appointments in the office, we must obtain your consent to participate.  Your consent will be active for this visit and any virtual visit you may have with one of our providers in the next 365 days.    If you have a MyChart account, I can also send a copy of this consent to you electronically.  All virtual visits are billed to your insurance company just like a traditional visit in the office.  As this is a virtual visit, video technology does not allow for your provider to perform a traditional examination.  This may limit your provider's ability to fully assess your condition.  If your provider identifies any concerns that need to be evaluated in person or the need to arrange testing such as labs, EKG, etc, we will make arrangements to do so.    Although advances in technology are sophisticated, we cannot ensure that it will always work on either your end or our end.  If the connection with a video visit is poor, we may have to switch to a telephone visit.  With either a video or telephone visit, we are not always able to ensure that we have a secure connection.   I need to obtain your verbal consent now.   Are you willing to proceed with your visit today?   MILANIA HAUBNER has provided verbal consent on 08/04/2022 for a virtual visit (video or telephone).   Edison Simon [place], [place] 08/04/2022  8:40 am

## 2022-08-04 NOTE — Patient Instructions (Signed)
[name] , Thank you for taking time to come for your Medicare Wellness Visit. I appreciate your ongoing commitment to your health goals. Please review the following plan we discussed and let me know if I can assist you in the future.   Screening recommendations/referrals: Colonoscopy -cologuard requested Mammogram to call and schedule  Bone Density to call and schedul Recommended yearly ophthalmology/optometry visit for glaucoma screening and checkup Recommended yearly dental visit for hygiene and checkup  Vaccinations: Influenza vaccine- due annually in September/October Pneumococcal vaccine up to date Tdap vaccine up to date Shingles vaccine -DUE- recommend to get at your local pharmacy    Advanced directives: recommended to complete and keep updated  Conditions/risks identified: advance age, blind, hyperlipidemia, obesity  Next appointment: yearly- next year in person   Preventive Care 28 Years and Older, Female Preventive care refers to lifestyle choices and visits with your health care provider that can promote health and wellness. What does preventive care include? A yearly physical exam. This is also called an annual well check. Dental exams once or twice a year. Routine eye exams. Ask your health care provider how often you should have your eyes checked. Personal lifestyle choices, including: Daily care of your teeth and gums. Regular physical activity. Eating a healthy diet. Avoiding tobacco and drug use. Limiting alcohol use. Practicing safe sex. Taking low-dose aspirin every day. Taking vitamin and mineral supplements as recommended by your health care provider. What happens during an annual well check? The services and screenings done by your health care provider during your annual well check will depend on your age, overall health, lifestyle risk factors, and family history of disease. Counseling  Your health care provider may ask you questions about  your: Alcohol use. Tobacco use. Drug use. Emotional well-being. Home and relationship well-being. Sexual activity. Eating habits. History of falls. Memory and ability to understand (cognition). Work and work Astronomer. Reproductive health. Screening  You may have the following tests or measurements: Height, weight, and BMI. Blood pressure. Lipid and cholesterol levels. These may be checked every 5 years, or more frequently if you are over 53 years old. Skin check. Lung cancer screening. You may have this screening every year starting at age 2 if you have a 30-pack-year history of smoking and currently smoke or have quit within the past 15 years. Fecal occult blood test (FOBT) of the stool. You may have this test every year starting at age 89. Flexible sigmoidoscopy or colonoscopy. You may have a sigmoidoscopy every 5 years or a colonoscopy every 10 years starting at age 40. Hepatitis C blood test. Hepatitis B blood test. Sexually transmitted disease (STD) testing. Diabetes screening. This is done by checking your blood sugar (glucose) after you have not eaten for a while (fasting). You may have this done every 1-3 years. Bone density scan. This is done to screen for osteoporosis. You may have this done starting at age 49. Mammogram. This may be done every 1-2 years. Talk to your health care provider about how often you should have regular mammograms. Talk with your health care provider about your test results, treatment options, and if necessary, the need for more tests. Vaccines  Your health care provider may recommend certain vaccines, such as: Influenza vaccine. This is recommended every year. Tetanus, diphtheria, and acellular pertussis (Tdap, Td) vaccine. You may need a Td booster every 10 years. Zoster vaccine. You may need this after age 79. Pneumococcal 13-valent conjugate (PCV13) vaccine. One dose is recommended after age  65. Pneumococcal polysaccharide (PPSV23) vaccine.  One dose is recommended after age 46. Talk to your health care provider about which screenings and vaccines you need and how often you need them. This information is not intended to replace advice given to you by your health care provider. Make sure you discuss any questions you have with your health care provider. Document Released: 10/05/2015 Document Revised: 05/28/2016 Document Reviewed: 07/10/2015 Elsevier Interactive Patient Education  2017 [place] Prevention in the Home Falls can cause injuries. They can happen to people of all ages. There are many things you can do to make your home safe and to help prevent falls. What can I do on the outside of my home? Regularly fix the edges of walkways and driveways and fix any cracks. Remove anything that might make you trip as you walk through a door, such as a raised step or threshold. Trim any bushes or trees on the path to your home. Use bright outdoor lighting. Clear any walking paths of anything that might make someone trip, such as rocks or tools. Regularly check to see if handrails are loose or broken. Make sure that both sides of any steps have handrails. Any raised decks and porches should have guardrails on the edges. Have any leaves, snow, or ice cleared regularly. Use sand or salt on walking paths during winter. Clean up any spills in your garage right away. This includes oil or grease spills. What can I do in the bathroom? Use night lights. Install grab bars by the toilet and in the tub and shower. Do not use towel bars as grab bars. Use non-skid mats or decals in the tub or shower. If you need to sit down in the shower, use a plastic, non-slip stool. Keep the floor dry. Clean up any water that spills on the floor as soon as it happens. Remove soap buildup in the tub or shower regularly. Attach bath mats securely with double-sided non-slip rug tape. Do not have throw rugs and other things on the floor that can make  you trip. What can I do in the bedroom? Use night lights. Make sure that you have a light by your bed that is easy to reach. Do not use any sheets or blankets that are too big for your bed. They should not hang down onto the floor. Have a firm chair that has side arms. You can use this for support while you get dressed. Do not have throw rugs and other things on the floor that can make you trip. What can I do in the kitchen? Clean up any spills right away. Avoid walking on wet floors. Keep items that you use a lot in easy-to-reach places. If you need to reach something above you, use a strong step stool that has a grab bar. Keep electrical cords out of the way. Do not use floor polish or wax that makes floors slippery. If you must use wax, use non-skid floor wax. Do not have throw rugs and other things on the floor that can make you trip. What can I do with my stairs? Do not leave any items on the stairs. Make sure that there are handrails on both sides of the stairs and use them. Fix handrails that are broken or loose. Make sure that handrails are as long as the stairways. Check any carpeting to make sure that it is firmly attached to the stairs. Fix any carpet that is loose or worn. Avoid having throw rugs at  the top or bottom of the stairs. If you do have throw rugs, attach them to the floor with carpet tape. Make sure that you have a light switch at the top of the stairs and the bottom of the stairs. If you do not have them, ask someone to add them for you. What else can I do to help prevent falls? Wear shoes that: Do not have high heels. Have rubber bottoms. Are comfortable and fit you well. Are closed at the toe. Do not wear sandals. If you use a stepladder: Make sure that it is fully opened. Do not climb a closed stepladder. Make sure that both sides of the stepladder are locked into place. Ask someone to hold it for you, if possible. Clearly mark and make sure that you can  see: Any grab bars or handrails. First and last steps. Where the edge of each step is. Use tools that help you move around (mobility aids) if they are needed. These include: Canes. Walkers. Scooters. Crutches. Turn on the lights when you go into a dark area. Replace any light bulbs as soon as they burn out. Set up your furniture so you have a clear path. Avoid moving your furniture around. If any of your floors are uneven, fix them. If there are any pets around you, be aware of where they are. Review your medicines with your doctor. Some medicines can make you feel dizzy. This can increase your chance of falling. Ask your doctor what other things that you can do to help prevent falls. This information is not intended to replace advice given to you by your health care provider. Make sure you discuss any questions you have with your health care provider. Document Released: 07/05/2009 Document Revised: 02/14/2016 Document Reviewed: 10/13/2014 Elsevier Interactive Patient Education  2017 [employer].

## 2022-08-04 NOTE — Progress Notes (Signed)
Subjective:   [name] is a 71 y.o. female who presents for Medicare Annual (Subsequent) preventive examination.  Review of Systems     Cardiac Risk Factors include: advanced age (>26men, >25 women);hypertension;sedentary lifestyle;smoking/ tobacco exposure     Objective:    There were no vitals filed for this visit. There is no height or weight on file to calculate BMI.     08/04/2022    7:55 AM 04/25/2022    2:39 PM 10/18/2021    3:20 PM 10/11/2021    2:47 PM 07/09/2021    4:27 PM 07/08/2021    3:11 PM 01/18/2020    1:50 PM  Advanced Directives  Does Patient Have a Medical Advance Directive? Yes Yes Yes Yes Yes Yes Yes  Type of Advance Directive Out of facility DNR (pink MOST or yellow form) Out of facility DNR (pink MOST or yellow form) Out of facility DNR (pink MOST or yellow form) Out of facility DNR (pink MOST or yellow form) Out of facility DNR (pink MOST or yellow form) Out of facility DNR (pink MOST or yellow form) Out of facility DNR (pink MOST or yellow form)  Does patient want to make changes to medical advance directive? No - Patient declined No - Patient declined No - Patient declined No - Patient declined No - Patient declined No - Patient declined No - Patient declined  Pre-existing out of facility DNR order (yellow form or pink MOST form) Yellow form placed in chart (order not valid for inpatient use) Yellow form placed in chart (order not valid for inpatient use)  Yellow form placed in chart (order not valid for inpatient use)  Yellow form placed in chart (order not valid for inpatient use)     Current Medications (verified) Outpatient Encounter Medications as of 08/04/2022  Medication Sig   atenolol (TENORMIN) 25 MG tablet Take 1 tablet by mouth once daily   atorvastatin (LIPITOR) 40 MG tablet Take 1 tablet (40 mg total) by mouth daily.   calcium carbonate (OSCAL) 1500 (600 Ca) MG TABS tablet Take 1,500 mg by mouth daily.   cholecalciferol (VITAMIN D3) 25  MCG (1000 UT) tablet Take 1,000 Units by mouth daily.   magnesium gluconate (MAGONATE) 500 MG tablet Take 1 tablet (500 mg total) by mouth daily.   thioridazine (MELLARIL) 100 MG tablet Take 100 mg by mouth 2 (two) times daily.   [DISCONTINUED] LUMIGAN 0.01 % SOLN Place 1 drop into the right eye at bedtime. (Patient not taking: Reported on 08/04/2022)   No facility-administered encounter medications on file as of 08/04/2022.    Allergies (verified) Patient has no known allergies.   History: Past Medical History:  Diagnosis Date   Chlamydia trachomatis infection of unspecified genitourinary site 12/18/2011   Cough    Disorder of bone and cartilage, unspecified    Edema    Hypercalcemia 02/14/2015   Hyperlipemia    Hyperparathyroidism    Hypertension    Osteopenia    Other abnormal blood chemistry    Schizophrenia (HCC)    "little bit" (02/23/2015)   Tachycardia, unspecified 05/29/2010   Tobacco use disorder    Unspecified glaucoma(365.9)    Vitamin D deficiency    Past Surgical History:  Procedure Laterality Date   BREAST EXCISIONAL BIOPSY Left    EYE SURGERY Left 02/2022   NO PAST SURGERIES     PARATHYROIDECTOMY N/A 03/15/2015   Procedure: PARATHYROIDECTOMY AND NECK EXPLORATION;  Surgeon: Darnell Level, MD;  Location: [facility] OR;  Service:  General;  Laterality: N/A;   REFRACTIVE SURGERY Left 08/2021   Family History  Problem Relation Age of Onset   Cancer Mother    Cancer Brother        Throat   Hypertension Other    Diabetes Other    Social History   Socioeconomic History   Marital status: Widowed    Spouse name: Not on file   Number of children: Not on file   Years of education: Not on file   Highest education level: Not on file  Occupational History   Not on file  Tobacco Use   Smoking status: Some Days    Packs/day: 0.12    Years: 47.00    Total pack years: 5.64    Types: Cigarettes   Smokeless tobacco: Never   Tobacco comments:    Smokes every now and  then, not everyday  Vaping Use   Vaping Use: Never used  Substance and Sexual Activity   Alcohol use: Yes    Comment: 1 beer off/on   Drug use: No   Sexual activity: Yes  Other Topics Concern   Not on file  Social History Narrative   Not on file   Social Determinants of Health   Financial Resource Strain: Low Risk  (02/17/2018)   Overall Financial Resource Strain (CARDIA)    Difficulty of Paying Living Expenses: Not hard at all  Food Insecurity: No Food Insecurity (02/17/2018)   Hunger Vital Sign    Worried About Running Out of Food in the Last Year: Never true    Ran Out of Food in the Last Year: Never true  Transportation Needs: No Transportation Needs (02/17/2018)   PRAPARE - Administrator, Civil ServiceTransportation    Lack of Transportation (Medical): No    Lack of Transportation (Non-Medical): No  Physical Activity: Inactive (02/17/2018)   Exercise Vital Sign    Days of Exercise per Week: 0 days    Minutes of Exercise per Session: 0 min  Stress: No Stress Concern Present (02/17/2018)   [employer] - Occupational Stress Questionnaire    Feeling of Stress : Only a little  Social Connections: Somewhat Isolated (02/17/2018)   Social Connection and Isolation Panel [NHANES]    Frequency of Communication with Friends and Family: Three times a week    Frequency of Social Gatherings with Friends and Family: Once a week    Attends Religious Services: Never    Database administratorActive Member of Clubs or Organizations: No    Attends Engineer, structuralClub or Organization Meetings: Never    Marital Status: Married    Tobacco Counseling Ready to quit: Not Answered Counseling given: Not Answered Tobacco comments: Smokes every now and then, not everyday   Clinical Intake:     Pain : No/denies pain     BMI - recorded: 32 Nutritional Status: BMI > 30  Obese Nutritional Risks: Unintentional weight gain Diabetes: No  How often do you need to have someone help you when you read instructions, pamphlets, or  other written materials from your doctor or pharmacy?: 5 - Always  Diabetic?no         Activities of Daily Living    08/04/2022    3:54 PM  In your present state of health, do you have any difficulty performing the following activities:  Hearing? 0  Vision? 1  Difficulty concentrating or making decisions? 1  Walking or climbing stairs? 1  Comment does not do stairs  Dressing or bathing? 0  Doing errands, shopping? 1  Preparing Food and eating ? Y  Using the Toilet? N  In the past six months, have you accidently leaked urine? N  Do you have problems with loss of bowel control? N  Managing your Medications? Y  Managing your Finances? Y  Housekeeping or managing your Housekeeping? Y    Patient Care Team: [name], NP as PCP - General (Nurse Practitioner)  Indicate any recent Medical Services you may have received from other than [facility] providers in the past year (date may be approximate).     Assessment:   This is a routine wellness examination for [name].  Hearing/Vision screen Hearing Screening - Comments:: No hearing issues  Vision Screening - Comments:: Last eye exam less than 12 months ago with Dr. Jaclynn Major  Dietary issues and exercise activities discussed:     Goals Addressed   None    Depression Screen    08/04/2022    8:38 AM 04/25/2022    2:39 PM 10/11/2021    2:47 PM 07/09/2021    4:31 PM 03/30/2019    2:51 PM 02/17/2018    9:53 AM 02/17/2017   10:34 AM  PHQ 2/9 Scores  PHQ - 2 Score 0 0 0 0 0 0 0    Fall Risk    08/04/2022    8:38 AM 04/25/2022    2:39 PM 10/18/2021    3:19 PM 10/11/2021    2:47 PM 07/09/2021    4:26 PM  Fall Risk   Falls in the past year? 0 0 0 0 0  Number falls in past yr: 0 0 0 0 0  Injury with Fall? 0 0 0 0 0  Risk for fall due to : No Fall Risks No Fall Risks No Fall Risks No Fall Risks No Fall Risks  Follow up Falls evaluation completed Falls evaluation completed Falls evaluation completed Falls evaluation completed  Falls evaluation completed    FALL RISK PREVENTION PERTAINING TO THE HOME:  Any stairs in or around the home? No  If so, are there any without handrails?  na Home free of loose throw rugs in walkways, pet beds, electrical cords, etc? Yes  Adequate lighting in your home to reduce risk of falls? Yes   ASSISTIVE DEVICES UTILIZED TO PREVENT FALLS:  Life alert? No  Use of a cane, walker or w/c? Yes  Grab bars in the bathroom? No  Shower chair or bench in shower? No  Elevated toilet seat or a handicapped toilet? Yes   TIMED UP AND GO:  Was the test performed? No .    Cognitive Function:    02/17/2018    9:54 AM 01/02/2016    3:05 PM 03/15/2014    2:35 PM  MMSE - Mini Mental State Exam  Orientation to time 5 5 5   Orientation to Place 5 5 5   Registration 3 3 3   Attention/ Calculation 4 4 4   Recall 3 2 1   Language- name 2 objects 2 2 2   Language- repeat 1 1 1   Language- follow 3 step command 3 3 3   Language- read & follow direction 1 1 1   Write a sentence 1 1 1   Copy design 1 0 0  Total score 29 27 26         08/04/2022    8:41 AM 07/09/2021    4:27 PM  6CIT Screen  What Year? 0 points 0 points  What month? 0 points 0 points  What time? 0 points 0 points  Count back from  20 0 points 0 points  Months in reverse 0 points 4 points  Repeat phrase 6 points 2 points  Total Score 6 points 6 points    Immunizations Immunization History  Administered Date(s) Administered   Fluad Quad(high Dose 65+) 05/11/2019, 07/08/2021   Influenza, High Dose Seasonal PF 08/20/2017   Influenza,inj,Quad PF,6+ Mos 06/14/2013, 07/18/2014, 05/15/2016   PFIZER(Purple Top)SARS-COV-2 Vaccination 01/05/2020, 01/26/2020   PNEUMOCOCCAL CONJUGATE-20 04/25/2022   Pneumococcal Conjugate-13 03/15/2014, 02/17/2018   Pneumococcal Polysaccharide-23 01/02/2016   Td 09/23/1995   Tdap 12/18/2011, 07/10/2019    TDAP status: Up to date  Flu Vaccine status: Due, Education has been provided regarding the  importance of this vaccine. Advised may receive this vaccine at local pharmacy or Health Dept. Aware to provide a copy of the vaccination record if obtained from local pharmacy or Health Dept. Verbalized acceptance and understanding. Pneumococcal vaccine status: Up to date Pneumococcal vaccine status: Up to date  Covid-19 vaccine status: Information provided on how to obtain vaccines.   Qualifies for Shingles Vaccine? Yes   Zostavax completed No   Shingrix Completed?: No.    Education has been provided regarding the importance of this vaccine. Patient has been advised to call insurance company to determine out of pocket expense if they have not yet received this vaccine. Advised may also receive vaccine at local pharmacy or Health Dept. Verbalized acceptance and understanding.  Screening Tests Health Maintenance  Topic Date Due   Fecal DNA (Cologuard)  Never done   Zoster Vaccines- Shingrix (1 of 2) Never done   COVID-19 Vaccine (3 - Pfizer series) 03/22/2020   MAMMOGRAM  08/30/2020   INFLUENZA VACCINE  04/22/2022   Hepatitis C Screening  04/21/2024 (Originally 08/05/1969)   Medicare Annual Wellness (AWV)  08/05/2023   TETANUS/TDAP  07/09/2029   Pneumonia Vaccine 27+ Years old  Completed   DEXA SCAN  Completed   HPV VACCINES  Aged Out    Health Maintenance  Health Maintenance Due  Topic Date Due   Fecal DNA (Cologuard)  Never done   Zoster Vaccines- Shingrix (1 of 2) Never done   COVID-19 Vaccine (3 - Pfizer series) 03/22/2020   MAMMOGRAM  08/30/2020   INFLUENZA VACCINE  04/22/2022    Colorectal cancer screening: Referral to GI placed today. Pt aware the office will call re: appt.  Mammogram status: Ordered today. Pt provided with contact info and advised to call to schedule appt.   Bone Density status: Ordered today. Pt provided with contact info and advised to call to schedule appt.  Lung Cancer Screening: (Low Dose CT Chest recommended if Age 55-80 years, 30 pack-year  currently smoking OR have quit w/in 15years.) does qualify.   Lung Cancer Screening Referral: completed today  Additional Screening:  Hepatitis C Screening: does qualify; Completed   Vision Screening: Recommended annual ophthalmology exams for early detection of glaucoma and other disorders of the eye. Is the patient up to date with their annual eye exam?  Yes  Who is the provider or what is the name of the office in which the patient attends annual eye exams? Jaclynn Major If pt is not established with a provider, would they like to be referred to a provider to establish care? No .   Dental Screening: Recommended annual dental exams for proper oral hygiene  Community Resource Referral / Chronic Care Management: CRR required this visit?  No   CCM required this visit?  No      Plan:     I  have personally reviewed and noted the following in the patient's chart:   Medical and social history Use of alcohol, tobacco or illicit drugs  Current medications and supplements including opioid prescriptions. Patient is not currently taking opioid prescriptions. Functional ability and status Nutritional status Physical activity Advanced directives List of other physicians Hospitalizations, surgeries, and ER visits in previous 12 months Vitals Screenings to include cognitive, depression, and falls Referrals and appointments  In addition, I have reviewed and discussed with patient certain preventive protocols, quality metrics, and best practice recommendations. A written personalized care plan for preventive services as well as general preventive health recommendations were provided to patient.     [name], NP   08/04/2022    Virtual Visit via Telephone Note  I connected with patient 08/04/22 at  8:40 AM EST by telephone and verified that I am speaking with the correct person using two identifiers.  Location: Patient: home Provider: [facility]   I discussed the limitations, risks,  security and privacy concerns of performing an evaluation and management service by telephone and the availability of in person appointments. I also discussed with the patient that there may be a patient responsible charge related to this service. The patient expressed understanding and agreed to proceed.   I discussed the assessment and treatment plan with the patient. The patient was provided an opportunity to ask questions and all were answered. The patient agreed with the plan and demonstrated an understanding of the instructions.   The patient was advised to call back or seek an in-person evaluation if the symptoms worsen or if the condition fails to improve as anticipated.  I provided 15 minutes of non-face-to-face time during this encounter.  Janene Harvey. [employer] printed and mailed

## 2022-08-12 ENCOUNTER — Other Ambulatory Visit: Payer: Self-pay

## 2022-08-12 DIAGNOSIS — E782 Mixed hyperlipidemia: Secondary | ICD-10-CM

## 2022-08-20 ENCOUNTER — Other Ambulatory Visit: Payer: Medicare Other

## 2022-08-20 DIAGNOSIS — E782 Mixed hyperlipidemia: Secondary | ICD-10-CM | POA: Diagnosis not present

## 2022-08-21 LAB — COMPLETE METABOLIC PANEL WITH GFR
AG Ratio: 1.4 (calc) (ref 1.0–2.5)
ALT: 24 U/L (ref 6–29)
AST: 26 U/L (ref 10–35)
Albumin: 4 g/dL (ref 3.6–5.1)
Alkaline phosphatase (APISO): 66 U/L (ref 37–153)
BUN: 12 mg/dL (ref 7–25)
CO2: 24 mmol/L (ref 20–32)
Calcium: 9.2 mg/dL (ref 8.6–10.4)
Chloride: 106 mmol/L (ref 98–110)
Creat: 0.94 mg/dL (ref 0.60–1.00)
Globulin: 2.8 g/dL (calc) (ref 1.9–3.7)
Glucose, Bld: 138 mg/dL — ABNORMAL HIGH (ref 65–99)
Potassium: 3.8 mmol/L (ref 3.5–5.3)
Sodium: 143 mmol/L (ref 135–146)
Total Bilirubin: 0.3 mg/dL (ref 0.2–1.2)
Total Protein: 6.8 g/dL (ref 6.1–8.1)
eGFR: 65 mL/min/{1.73_m2} (ref >=60)

## 2022-08-21 LAB — LIPID PANEL
Cholesterol: 172 mg/dL (ref <200)
HDL: 53 mg/dL (ref >=50)
LDL Cholesterol (Calc): 95 mg/dL (calc)
Non-HDL Cholesterol (Calc): 119 mg/dL (calc) (ref <130)
Total CHOL/HDL Ratio: 3.2 (calc) (ref <5.0)
Triglycerides: 147 mg/dL (ref <150)

## 2022-08-25 ENCOUNTER — Encounter: Payer: Self-pay | Admitting: Nurse Practitioner

## 2022-08-25 ENCOUNTER — Ambulatory Visit: Payer: Medicare Other | Admitting: Nurse Practitioner

## 2022-08-25 VITALS — BP 138/80 | HR 103 | Temp 98.6°F | Ht 62.0 in | Wt 186.6 lb

## 2022-08-25 DIAGNOSIS — F209 Schizophrenia, unspecified: Secondary | ICD-10-CM | POA: Diagnosis not present

## 2022-08-25 DIAGNOSIS — E6609 Other obesity due to excess calories: Secondary | ICD-10-CM

## 2022-08-25 DIAGNOSIS — R739 Hyperglycemia, unspecified: Secondary | ICD-10-CM

## 2022-08-25 DIAGNOSIS — Z6834 Body mass index (BMI) 34.0-34.9, adult: Secondary | ICD-10-CM

## 2022-08-25 DIAGNOSIS — I10 Essential (primary) hypertension: Secondary | ICD-10-CM | POA: Diagnosis not present

## 2022-08-25 DIAGNOSIS — M8580 Other specified disorders of bone density and structure, unspecified site: Secondary | ICD-10-CM

## 2022-08-25 DIAGNOSIS — E782 Mixed hyperlipidemia: Secondary | ICD-10-CM | POA: Diagnosis not present

## 2022-08-25 DIAGNOSIS — Z23 Encounter for immunization: Secondary | ICD-10-CM

## 2022-08-25 DIAGNOSIS — F17200 Nicotine dependence, unspecified, uncomplicated: Secondary | ICD-10-CM | POA: Diagnosis not present

## 2022-08-25 DIAGNOSIS — R000 Tachycardia, unspecified: Secondary | ICD-10-CM | POA: Diagnosis not present

## 2022-08-25 MED ORDER — ATENOLOL 50 MG PO TABS: 50.0000 mg | ORAL_TABLET | Freq: Every day | ORAL | 1 refills | Status: DC

## 2022-08-25 NOTE — Progress Notes (Signed)
Careteam: Patient Care Team: [name], NP as PCP - General (Nurse Practitioner)  PLACE OF SERVICE:  [facility]  Advanced Directive information    No Known Allergies  Chief Complaint  Patient presents with   Follow-up    4 month follow up  discuss labs results      HPI: Patient is a 71 y.o. female for routine follow up.   Hypertension- on atenolol- took this morning.   Hyperlipidemia- LDL improved on lipitor 40 mg daily  Schizophrenia- on thioidazine- followed by mental health   Blood sugar is elevated on lab- A1c   Reports she wanted to gain weight "have more in my thighs"   Smoker- smoking because she does not have a lot to do since she is not able to see.   She lives alone with her room mate  Review of Systems:  Review of Systems  Constitutional:  Negative for chills, fever and weight loss.  HENT:  Negative for tinnitus.   Respiratory:  Negative for cough, sputum production and shortness of breath.   Cardiovascular:  Negative for chest pain, palpitations and leg swelling.  Gastrointestinal:  Negative for abdominal pain, constipation, diarrhea and heartburn.  Genitourinary:  Negative for dysuria, frequency and urgency.  Musculoskeletal:  Negative for back pain, falls, joint pain and myalgias.  Skin: Negative.   Neurological:  Negative for dizziness and headaches.  Psychiatric/Behavioral:  Negative for depression and memory loss. The patient does not have insomnia.     Past Medical History:  Diagnosis Date   Chlamydia trachomatis infection of unspecified genitourinary site 12/18/2011   Cough    Disorder of bone and cartilage, unspecified    Edema    Hypercalcemia 02/14/2015   Hyperlipemia    Hyperparathyroidism    Hypertension    Osteopenia    Other abnormal blood chemistry    Schizophrenia (HCC)    "little bit" (02/23/2015)   Tachycardia, unspecified 05/29/2010   Tobacco use disorder    Unspecified glaucoma(365.9)    Vitamin D deficiency     Past Surgical History:  Procedure Laterality Date   BREAST EXCISIONAL BIOPSY Left    EYE SURGERY Left 02/2022   NO PAST SURGERIES     PARATHYROIDECTOMY N/A 03/15/2015   Procedure: PARATHYROIDECTOMY AND NECK EXPLORATION;  Surgeon: Darnell Level, MD;  Location: [facility] OR;  Service: General;  Laterality: N/A;   REFRACTIVE SURGERY Left 08/2021   Social History:   reports that she has been smoking cigarettes. She has a 5.64 pack-year smoking history. She has never used smokeless tobacco. She reports current alcohol use. She reports that she does not use drugs.  Family History  Problem Relation Age of Onset   Cancer Mother    Cancer Brother        Throat   Hypertension Other    Diabetes Other     Medications: Patient's Medications  New Prescriptions   No medications on file  Previous Medications   ATENOLOL (TENORMIN) 25 MG TABLET    Take 1 tablet by mouth once daily   ATORVASTATIN (LIPITOR) 40 MG TABLET    Take 1 tablet (40 mg total) by mouth daily.   CALCIUM CARBONATE (OSCAL) 1500 (600 CA) MG TABS TABLET    Take 1,500 mg by mouth daily.   CHOLECALCIFEROL (VITAMIN D3) 25 MCG (1000 UT) TABLET    Take 1,000 Units by mouth daily.   MAGNESIUM GLUCONATE (MAGONATE) 500 MG TABLET    Take 1 tablet (500 mg total) by  mouth daily.   PREDNISOLONE ACETATE (PRED FORTE) 1 % OPHTHALMIC SUSPENSION    Place 1 drop into the left eye 2 (two) times daily.   THIORIDAZINE (MELLARIL) 100 MG TABLET    Take 100 mg by mouth 2 (two) times daily.  Modified Medications   No medications on file  Discontinued Medications   No medications on file    Physical Exam:  Vitals:   08/25/22 1325  BP: (!) 162/70  Pulse: (!) 103  Temp: 98.6 F (37 C)  SpO2: 96%  Weight: 186 lb 9.6 oz (84.6 kg)  Height: 5\' 2"  (1.575 m)   Body mass index is 34.13 kg/m. Wt Readings from Last 3 Encounters:  08/25/22 186 lb 9.6 oz (84.6 kg)  04/25/22 179 lb (81.2 kg)  10/18/21 165 lb 6.4 oz (75 kg)    Physical  Exam Constitutional:      General: She is not in acute distress.    Appearance: She is well-developed. She is not diaphoretic.  HENT:     Head: Normocephalic and atraumatic.     Mouth/Throat:     Pharynx: No oropharyngeal exudate.  Eyes:     Conjunctiva/sclera: Conjunctivae normal.     Pupils: Pupils are equal, round, and reactive to light.  Cardiovascular:     Rate and Rhythm: Normal rate and regular rhythm.     Heart sounds: Normal heart sounds.  Pulmonary:     Effort: Pulmonary effort is normal.     Breath sounds: Normal breath sounds.  Abdominal:     General: Bowel sounds are normal.     Palpations: Abdomen is soft.  Musculoskeletal:     Cervical back: Normal range of motion and neck supple.     Right lower leg: No edema.     Left lower leg: No edema.  Skin:    General: Skin is warm and dry.  Neurological:     Mental Status: She is alert. Mental status is at baseline.  Psychiatric:        Mood and Affect: Mood normal.     Labs reviewed: Basic Metabolic Panel: Recent Labs    10/04/21 1044 04/22/22 1021 08/20/22 0935  NA 141 142 143  K 3.8 3.9 3.8  CL 104 107 106  CO2 28 24 24   GLUCOSE 118* 126* 138*  BUN 14 12 12   CREATININE 1.14* 1.10* 0.94  CALCIUM 10.3 9.8 9.2   Liver Function Tests: Recent Labs    10/04/21 1044 04/22/22 1021 08/20/22 0935  AST 21 20 26   ALT 19 22 24   BILITOT 0.3 0.4 0.3  PROT 7.1 7.1 6.8   No results for input(s): "LIPASE", "AMYLASE" in the last 8760 hours. No results for input(s): "AMMONIA" in the last 8760 hours. CBC: Recent Labs    04/22/22 1021  WBC 6.0  NEUTROABS 2,712  HGB 13.3  HCT 39.7  MCV 86.7  PLT 173   Lipid Panel: Recent Labs    10/04/21 1044 04/22/22 1021 08/20/22 0935  CHOL 194 231* 172  HDL 70 53 53  LDLCALC 107* 138* 95  TRIG 80 261* 147  CHOLHDL 2.8 4.4 3.2   TSH: No results for input(s): "TSH" in the last 8760 hours. A1C: Lab Results  Component Value Date   HGBA1C 5.6 03/30/2019      Assessment/Plan 1. Mixed hyperlipidemia Improved since taking lipitor consistently, continue dietary modifications   2. Essential hypertension -HR and BP elevated, will have her increase atenolol to get blood pressure and HR to  goal.  Continue low sodium diet.  - atenolol (TENORMIN) 50 MG tablet; Take 1 tablet (50 mg total) by mouth daily.  Dispense: 90 tablet; Refill: 1  3. Schizophrenia, unspecified type (HCC) -stable, has roommate and sister for support, continues to follow up with psych.  4. Osteopenia, unspecified location -Recommended to take calcium 600 mg twice daily with Vitamin D 2000 units daily and weight bearing activity 30 mins/5 days a week  5. Smoker -cessation encouraged.   6. Class 1 obesity due to excess calories with serious comorbidity and body mass index (BMI) of 34.0 to 34.9 in adult --education provided on healthy weight loss through increase in physical activity and proper nutrition   7. Tachycardia - atenolol (TENORMIN) 50 MG tablet; Take 1 tablet (50 mg total) by mouth daily.  Dispense: 90 tablet; Refill: 1  8. Elevated blood sugar -discussed low sugar, diabetic diet. She has been eating a lot more sweets recently and willing to cut back.  - Hemoglobin A1c    Return in about 1 month (around 09/25/2022) for high blood pressure and HR. Janene Harvey. Biagio Borg  [facility] [phone]

## 2022-08-25 NOTE — Addendum Note (Signed)
Addended by: Meda Klinefelter E on: 08/25/2022 02:02 PM   Modules accepted: Orders

## 2022-08-25 NOTE — Patient Instructions (Signed)
Increase atenolol to 50 mg daily (can take 2 of your 25 mg tablet)

## 2022-08-26 LAB — HEMOGLOBIN A1C
Hgb A1c MFr Bld: 6.6 % of total Hgb — ABNORMAL HIGH (ref <5.7)
Mean Plasma Glucose: 143 mg/dL
eAG (mmol/L): 7.9 mmol/L

## 2022-10-06 ENCOUNTER — Encounter: Payer: Medicare Other | Admitting: Nurse Practitioner

## 2022-10-06 ENCOUNTER — Other Ambulatory Visit: Payer: Self-pay | Admitting: *Deleted

## 2022-10-06 DIAGNOSIS — F17210 Nicotine dependence, cigarettes, uncomplicated: Secondary | ICD-10-CM

## 2022-10-06 DIAGNOSIS — Z87891 Personal history of nicotine dependence: Secondary | ICD-10-CM

## 2022-10-06 DIAGNOSIS — Z122 Encounter for screening for malignant neoplasm of respiratory organs: Secondary | ICD-10-CM

## 2022-10-07 NOTE — Progress Notes (Signed)
This encounter was created in error - please disregard.

## 2022-10-09 ENCOUNTER — Ambulatory Visit
Admission: RE | Admit: 2022-10-09 | Discharge: 2022-10-09 | Disposition: A | Discharge status: Home / Self Care | Payer: Medicare Other | Source: Ambulatory Visit | Attending: Nurse Practitioner | Admitting: Nurse Practitioner

## 2022-10-09 DIAGNOSIS — Z1231 Encounter for screening mammogram for malignant neoplasm of breast: Secondary | ICD-10-CM | POA: Diagnosis not present

## 2022-10-24 ENCOUNTER — Other Ambulatory Visit: Payer: Self-pay | Admitting: Nurse Practitioner

## 2022-10-24 DIAGNOSIS — E782 Mixed hyperlipidemia: Secondary | ICD-10-CM

## 2022-11-03 ENCOUNTER — Ambulatory Visit (INDEPENDENT_AMBULATORY_CARE_PROVIDER_SITE_OTHER): Payer: Medicare Other | Admitting: Nurse Practitioner

## 2022-11-03 ENCOUNTER — Encounter: Payer: Self-pay | Admitting: Nurse Practitioner

## 2022-11-03 VITALS — BP 130/82 | HR 76 | Temp 97.3°F | Resp 18 | Ht 62.0 in | Wt 176.0 lb

## 2022-11-03 DIAGNOSIS — R000 Tachycardia, unspecified: Secondary | ICD-10-CM

## 2022-11-03 DIAGNOSIS — E782 Mixed hyperlipidemia: Secondary | ICD-10-CM | POA: Diagnosis not present

## 2022-11-03 DIAGNOSIS — H409 Unspecified glaucoma: Secondary | ICD-10-CM | POA: Diagnosis not present

## 2022-11-03 DIAGNOSIS — F17200 Nicotine dependence, unspecified, uncomplicated: Secondary | ICD-10-CM | POA: Diagnosis not present

## 2022-11-03 DIAGNOSIS — E1169 Type 2 diabetes mellitus with other specified complication: Secondary | ICD-10-CM

## 2022-11-03 DIAGNOSIS — I10 Essential (primary) hypertension: Secondary | ICD-10-CM | POA: Diagnosis not present

## 2022-11-03 DIAGNOSIS — E6609 Other obesity due to excess calories: Secondary | ICD-10-CM

## 2022-11-03 DIAGNOSIS — Z6834 Body mass index (BMI) 34.0-34.9, adult: Secondary | ICD-10-CM

## 2022-11-03 NOTE — Patient Instructions (Signed)
Continue diabetic diet.  Drink more water- avoid soda and sweet tea

## 2022-11-03 NOTE — Progress Notes (Signed)
Careteam: Patient Care Team: [name], NP as PCP - General (Nurse Practitioner)  PLACE OF SERVICE:  [place] Directive information    No Known Allergies  Chief Complaint  Patient presents with   Medical Management of Chronic Issues    Patient is here for a 106M F/U for HTN and HR     HPI: Patient is a 72 y.o. female here for f/u on HR and BP. Much better today. She is feeling well. No concerns.   Reports that she hasn't been able to check either at home due to her poor vision and that when her sister is over to help she often forgets.   Denies numbness, tingling, dizziness, palpitations, weakness, headaches. No chest pain or shortness of breath. Says she avoids salty foods and sugar as well. She has lost 10 lbs. Cutting back on portions. She has nocturia occasionally but it's gotten better lately.   Feels like she has cut back on smoking. She smokes about 1 pack every 2-3 days. Prior it was about a pack every 2 days. She is trying to give it up. She has her lung screening coming up.   Review of Systems:  Review of Systems  Constitutional:  Negative for chills, fever and weight loss.  HENT:  Negative for tinnitus.   Eyes:        Pt is blind  Respiratory:  Negative for cough, sputum production and shortness of breath.   Cardiovascular:  Negative for chest pain, palpitations and leg swelling.  Gastrointestinal:  Negative for abdominal pain, constipation, diarrhea and heartburn.  Genitourinary:  Negative for dysuria, frequency and urgency.  Musculoskeletal:  Negative for back pain, falls, joint pain and myalgias.  Skin: Negative.   Neurological:  Negative for dizziness and headaches.  Psychiatric/Behavioral:  Negative for depression and memory loss. The patient does not have insomnia.     Past Medical History:  Diagnosis Date   Chlamydia trachomatis infection of unspecified genitourinary site 12/18/2011   Cough    Disorder of bone and cartilage,  unspecified    Edema    Hypercalcemia 02/14/2015   Hyperlipemia    Hyperparathyroidism    Hypertension    Osteopenia    Other abnormal blood chemistry    Schizophrenia ([place])    "little bit" (02/23/2015)   Tachycardia, unspecified 05/29/2010   Tobacco use disorder    Unspecified glaucoma(365.9)    Vitamin D deficiency    Past Surgical History:  Procedure Laterality Date   BREAST EXCISIONAL BIOPSY Left    EYE SURGERY Left 02/2022   NO PAST SURGERIES     PARATHYROIDECTOMY N/A 03/15/2015   Procedure: PARATHYROIDECTOMY AND NECK EXPLORATION;  Surgeon: Armandina Gemma, MD;  Location: [facility];  Service: General;  Laterality: N/A;   REFRACTIVE SURGERY Left 08/2021   Social History:   reports that she has been smoking cigarettes. She has a 5.64 pack-year smoking history. She has never used smokeless tobacco. She reports current alcohol use. She reports that she does not use drugs.  Family History  Problem Relation Age of Onset   Cancer Mother    Cancer Brother        Throat   Hypertension Other    Diabetes Other     Medications: Patient's Medications  New Prescriptions   No medications on file  Previous Medications   ATENOLOL (TENORMIN) 50 MG TABLET    Take 1 tablet (50 mg total) by mouth daily.   ATORVASTATIN (LIPITOR) 40 MG  TABLET    Take 1 tablet by mouth once daily   CALCIUM CARBONATE (OSCAL) 1500 (600 CA) MG TABS TABLET    Take 1,500 mg by mouth daily.   CHOLECALCIFEROL (VITAMIN D3) 25 MCG (1000 UT) TABLET    Take 1,000 Units by mouth daily.   MAGNESIUM GLUCONATE (MAGONATE) 500 MG TABLET    Take 1 tablet (500 mg total) by mouth daily.   PREDNISOLONE ACETATE (PRED FORTE) 1 % OPHTHALMIC SUSPENSION    Place 1 drop into the left eye 2 (two) times daily.   THIORIDAZINE (MELLARIL) 100 MG TABLET    Take 100 mg by mouth 2 (two) times daily.  Modified Medications   No medications on file  Discontinued Medications   No medications on file    Physical Exam:  Vitals:   11/03/22 1328   BP: 130/82  Pulse: 76  Resp: 18  Temp: (!) 97.3 F (36.3 C)  SpO2: 97%  Weight: 176 lb (79.8 kg)  Height: 5' 2"$  (1.575 m)   Body mass index is 32.19 kg/m. Wt Readings from Last 3 Encounters:  11/03/22 176 lb (79.8 kg)  08/25/22 186 lb 9.6 oz (84.6 kg)  04/25/22 179 lb (81.2 kg)    Physical Exam Vitals reviewed.  Constitutional:      General: She is not in acute distress.    Appearance: Normal appearance.  Cardiovascular:     Rate and Rhythm: Normal rate and regular rhythm.  Pulmonary:     Effort: No respiratory distress.     Breath sounds: Normal breath sounds.  Abdominal:     General: Bowel sounds are normal. There is no distension.     Palpations: Abdomen is soft. There is no mass.     Tenderness: There is no abdominal tenderness. There is no guarding.  Musculoskeletal:     Cervical back: Neck supple.     Comments: Uses a cane with ambulation  Lymphadenopathy:     Cervical: No cervical adenopathy.  Skin:    General: Skin is warm and dry.  Neurological:     Mental Status: She is alert and oriented to person, place, and time.  Psychiatric:        Mood and Affect: Mood normal.     Labs reviewed: Basic Metabolic Panel: Recent Labs    04/22/22 1021 08/20/22 0935  NA 142 143  K 3.9 3.8  CL 107 106  CO2 24 24  GLUCOSE 126* 138*  BUN 12 12  CREATININE 1.10* 0.94  CALCIUM 9.8 9.2   Liver Function Tests: Recent Labs    04/22/22 1021 08/20/22 0935  AST 20 26  ALT 22 24  BILITOT 0.4 0.3  PROT 7.1 6.8   No results for input(s): "LIPASE", "AMYLASE" in the last 8760 hours. No results for input(s): "AMMONIA" in the last 8760 hours. CBC: Recent Labs    04/22/22 1021  WBC 6.0  NEUTROABS 2,712  HGB 13.3  HCT 39.7  MCV 86.7  PLT 173   Lipid Panel: Recent Labs    04/22/22 1021 08/20/22 0935  CHOL 231* 172  HDL 53 53  LDLCALC 138* 95  TRIG 261* 147  CHOLHDL 4.4 3.2   TSH: No results for input(s): "TSH" in the last 8760 hours. A1C: Lab  Results  Component Value Date   HGBA1C 6.6 (H) 08/25/2022     Assessment/Plan 1. Essential hypertension BP improved today. Continue diet and lifestyle modifications with atenolol.  - Complete Metabolic Panel with eGFR; Future - CBC with  Differential/Platelet; Future  2. Tachycardia Resolved, SR reg rate at this time.   3. Type 2 diabetes mellitus with other specified complication, without long-term current use of insulin (HCC) Patient has lost 10 lbs and is avoiding sweets, eating smaller portions. Encouraged patient to cut back on sugary drinks such as soda, juice, and tea. - Hemoglobin A1c; Future  4. Smoker Encouraged patient to stop smoking and offered support when she is ready. She says she is working on it and congratulated patient for cutting back. Lung cancer screening scheduled for later this month.   5. Class 1 obesity due to excess calories with serious comorbidity and body mass index (BMI) of 34.0 to 34.9 in adult Patient has lost 10 lbs and reports eating better. Continue diet and lifestyle modifications.   6. Mixed hyperlipidemia Stable. Continue atorvastatin.  - Complete Metabolic Panel with eGFR; Future - Lipid panel; Future  7. Glaucoma of both eyes, unspecified glaucoma type Discussed importance of avoiding falls, foot care with poor eyesight with patient and sister.   Return in about 3 months (around 02/01/2023) for routine follow up with labs prior to visit .  Student- Archer Asa O'Berry ACPCNP-S  I personally was present during the history, physical exam and medical decision-making activities of this service and have verified that the service and findings are accurately documented in the student's note [name], [facility] [phone]

## 2022-11-14 ENCOUNTER — Ambulatory Visit (INDEPENDENT_AMBULATORY_CARE_PROVIDER_SITE_OTHER): Payer: Medicare Other | Admitting: Primary Care

## 2022-11-14 DIAGNOSIS — F17200 Nicotine dependence, unspecified, uncomplicated: Secondary | ICD-10-CM

## 2022-11-14 DIAGNOSIS — F17210 Nicotine dependence, cigarettes, uncomplicated: Secondary | ICD-10-CM

## 2022-11-14 NOTE — Patient Instructions (Signed)
Thank you for participating in the [facility] Screening [facility]. It was our pleasure to meet you today. We will call you with the results of your scan within the next few days. Your scan will be assigned a Lung RADS category score by the physicians reading the scans.  This Lung RADS score determines follow up scanning.  See below for description of categories, and follow up screening recommendations. We will be in touch to schedule your follow up screening annually or based on recommendations of our providers. We will fax a copy of your scan results to your Primary Care Physician, or the physician who referred you to the program, to ensure they have the results. Please call the office if you have any questions or concerns regarding your scanning experience or results.  Our office number is [phone]. Please speak with Denise Phelps, RN. , or  Denise Buckner RN, They are  our Lung Cancer Screening RN.'s If They are unavailable when you call, Please leave a message on the voice mail. We will return your call at our earliest convenience.This voice mail is monitored several times a day.  Remember, if your scan is normal, we will scan you annually as long as you continue to meet the criteria for the program. (Age 50-80, Current smoker or smoker who has quit within the last 15 years). If you are a smoker, remember, quitting is the single most powerful action that you can take to decrease your risk of lung cancer and other pulmonary, breathing related problems. We know quitting is hard, and we are here to help.  Please let us know if there is anything we can do to help you meet your goal of quitting. If you are a former smoker, congratulations. We are proud of you! Remain smoke free! Remember you can refer friends or family members through the number above.  We will screen them to make sure they meet criteria for the program. Thank you for helping us take better care of you by  participating in Lung Screening.  You can receive free nicotine replacement therapy ( patches, gum or mints) by calling 1-800-QUIT NOW. Please call so we can get you on the path to becoming  a non-smoker. I know it is hard, but you can do this!  Lung RADS Categories:  Lung RADS 1: no nodules or definitely non-concerning nodules.  Recommendation is for a repeat annual scan in 12 months.  Lung RADS 2:  nodules that are non-concerning in appearance and behavior with a very low likelihood of becoming an active cancer. Recommendation is for a repeat annual scan in 12 months.  Lung RADS 3: nodules that are probably non-concerning , includes nodules with a low likelihood of becoming an active cancer.  Recommendation is for a 6-month repeat screening scan. Often noted after an upper respiratory illness. We will be in touch to make sure you have no questions, and to schedule your 6-month scan.  Lung RADS 4 A: nodules with concerning findings, recommendation is most often for a follow up scan in 3 months or additional testing based on our provider's assessment of the scan. We will be in touch to make sure you have no questions and to schedule the recommended 3 month follow up scan.  Lung RADS 4 B:  indicates findings that are concerning. We will be in touch with you to schedule additional diagnostic testing based on our provider's  assessment of the scan.  Other options for assistance in smoking cessation (   As covered by your insurance benefits)  Hypnosis for smoking cessation  [employer] [phone]  Acupuncture for smoking cessation  [facility] [phone]   

## 2022-11-14 NOTE — Progress Notes (Signed)
Virtual Visit via Telephone Note  I connected with [name] on 11/14/22 at  1:30 PM EST by telephone and verified that I am speaking with the correct person using two identifiers.  Location: Patient: Home Provider: Office    I discussed the limitations, risks, security and privacy concerns of performing an evaluation and management service by telephone and the availability of in person appointments. I also discussed with the patient that there may be a patient responsible charge related to this service. The patient expressed understanding and agreed to proceed.  Shared Decision Making Visit Lung Cancer Screening Program [id number])  Eligibility: Age 72 y.o. Pack Years Smoking History Calculation 21 (# packs/per year x # years smoked) Recent History of coughing up blood  no Unexplained weight loss? no ( >Than 15 pounds within the last 6 months ) Prior History Lung / other cancer no (Diagnosis within the last 5 years already requiring surveillance chest CT Scans). Smoking Status Current Smoker Former Smokers: Years since quit: NA  Quit Date: NA  Visit Components: Discussion included one or more decision making aids. yes Discussion included risk/benefits of screening. yes Discussion included potential follow up diagnostic testing for abnormal scans. yes Discussion included meaning and risk of over diagnosis. yes Discussion included meaning and risk of False Positives. yes Discussion included meaning of total radiation exposure. yes  Counseling Included: Importance of adherence to annual lung cancer LDCT screening. yes Impact of comorbidities on ability to participate in the program. yes Ability and willingness to under diagnostic treatment. yes  Smoking Cessation Counseling: Current Smokers:  Discussed importance of smoking cessation. yes Information about tobacco cessation classes and interventions provided to patient. yes Patient provided with "ticket" for LDCT Scan.  NA Symptomatic Patient. no  Counseling(Intermediate counseling: > three minutes) [id number] Diagnosis Code: Tobacco Use Z72.0 Asymptomatic Patient yes  Counseling (Intermediate counseling: > three minutes counseling) [id number] Former Smokers:  Discussed the importance of maintaining cigarette abstinence. yes Diagnosis Code: Personal History of Nicotine Dependence. [id number] Information about tobacco cessation classes and interventions provided to patient. Yes Patient provided with "ticket" for LDCT Scan. Na Written Order for Lung Cancer Screening with LDCT placed in Epic. Yes (CT Chest Lung Cancer Screening Low Dose W/O CM) [id number] Z12.2-Screening of respiratory organs Z87.891-Personal history of nicotine dependence  I have spent 25 minutes of face to face/ virtual visit   time with [name] discussing the risks and benefits of lung cancer screening. We viewed / discussed a power point together that explained in detail the above noted topics. We paused at intervals to allow for questions to be asked and answered to ensure understanding.We discussed that the single most powerful action that she can take to decrease her risk of developing lung cancer is to quit smoking. We discussed whether or not she is ready to commit to setting a quit date. We discussed options for tools to aid in quitting smoking including nicotine replacement therapy, non-nicotine medications, support groups, Quit Smart classes, and behavior modification. We discussed that often times setting smaller, more achievable goals, such as eliminating 1 cigarette a day for a week and then 2 cigarettes a day for a week can be helpful in slowly decreasing the number of cigarettes smoked. This allows for a sense of accomplishment as well as providing a clinical benefit. I provided her  with smoking cessation  information  with contact information for community resources, classes, free nicotine replacement therapy, and access to mobile apps, text  messaging, and on-line smoking  cessation help. I have also provided her  the office contact information in the event she needs to contact me, or the screening staff. We discussed the time and location of the scan, and that either Doroteo Glassman RN, Joella Prince, RN  or I will call / send a letter with the results within 24-72 hours of receiving them. The patient verbalized understanding of all of  the above and had no further questions upon leaving the office. They have my contact information in the event they have any further questions.  I spent 3-5 minutes counseling on smoking cessation and the health risks of continued tobacco abuse.  I explained to the patient that there has been a high incidence of coronary artery disease noted on these exams. I explained that this is a non-gated exam therefore degree or severity cannot be determined. This patient is on statin therapy. I have asked the patient to follow-up with their PCP regarding any incidental finding of coronary artery disease and management with diet or medication as their PCP  feels is clinically indicated. The patient verbalized understanding of the above and had no further questions upon completion of the visit.    Martyn Ehrich, NP

## 2022-11-17 ENCOUNTER — Ambulatory Visit
Admission: RE | Admit: 2022-11-17 | Discharge: 2022-11-17 | Disposition: A | Discharge status: Home / Self Care | Payer: Medicare Other | Source: Ambulatory Visit | Attending: Acute Care | Admitting: Acute Care

## 2022-11-17 DIAGNOSIS — F17210 Nicotine dependence, cigarettes, uncomplicated: Secondary | ICD-10-CM | POA: Diagnosis not present

## 2022-11-17 DIAGNOSIS — Z87891 Personal history of nicotine dependence: Secondary | ICD-10-CM

## 2022-11-17 DIAGNOSIS — Z122 Encounter for screening for malignant neoplasm of respiratory organs: Secondary | ICD-10-CM

## 2022-11-25 DIAGNOSIS — H44511 Absolute glaucoma, right eye: Secondary | ICD-10-CM | POA: Diagnosis not present

## 2022-11-25 DIAGNOSIS — H401123 Primary open-angle glaucoma, left eye, severe stage: Secondary | ICD-10-CM | POA: Diagnosis not present

## 2022-11-25 DIAGNOSIS — H2589 Other age-related cataract: Secondary | ICD-10-CM | POA: Diagnosis not present

## 2022-12-04 ENCOUNTER — Telehealth: Payer: Self-pay | Admitting: Acute Care

## 2022-12-04 ENCOUNTER — Other Ambulatory Visit: Payer: Self-pay | Admitting: Acute Care

## 2022-12-04 DIAGNOSIS — F17210 Nicotine dependence, cigarettes, uncomplicated: Secondary | ICD-10-CM

## 2022-12-04 DIAGNOSIS — Z87891 Personal history of nicotine dependence: Secondary | ICD-10-CM

## 2022-12-04 DIAGNOSIS — Z122 Encounter for screening for malignant neoplasm of respiratory organs: Secondary | ICD-10-CM

## 2022-12-04 DIAGNOSIS — J84112 Idiopathic pulmonary fibrosis: Secondary | ICD-10-CM

## 2022-12-04 NOTE — Telephone Encounter (Signed)
Results/ plans faxed to PCP. Order placed for 12 mth yearly lung screening CT.

## 2022-12-04 NOTE — Telephone Encounter (Signed)
I have called the patient with the results of her low-dose screening CT.  I explained that from a lung cancer perspective her scan was normal lung RADS 70-monthfollow-up.  I explained that there was notation of an incidental finding: Peripheral and basilar predominant subpleural reticular densities, ground-glass and bronchiolectasis, progressive from 01/21/2018 and highly suspicious for usual interstitial pneumonitis or nonspecific interstitial pneumonitis. I recommended nonurgent consult with pulmonary medicine.  She is in agreement with this plan.  Denise, 142-monthollow-up low-dose screening CT, and I will refer to pulmonary medicine for UIP finding. Thanks so much

## 2023-01-28 ENCOUNTER — Ambulatory Visit
Admission: RE | Admit: 2023-01-28 | Discharge: 2023-01-28 | Disposition: A | Discharge status: Home / Self Care | Payer: Medicare Other | Source: Ambulatory Visit | Attending: Nurse Practitioner | Admitting: Nurse Practitioner

## 2023-01-28 DIAGNOSIS — M810 Age-related osteoporosis without current pathological fracture: Secondary | ICD-10-CM | POA: Diagnosis not present

## 2023-01-28 DIAGNOSIS — M8580 Other specified disorders of bone density and structure, unspecified site: Secondary | ICD-10-CM | POA: Diagnosis not present

## 2023-01-28 DIAGNOSIS — E2839 Other primary ovarian failure: Secondary | ICD-10-CM

## 2023-01-30 ENCOUNTER — Other Ambulatory Visit: Payer: Medicare Other

## 2023-01-30 DIAGNOSIS — I10 Essential (primary) hypertension: Secondary | ICD-10-CM | POA: Diagnosis not present

## 2023-01-30 DIAGNOSIS — E782 Mixed hyperlipidemia: Secondary | ICD-10-CM | POA: Diagnosis not present

## 2023-01-30 DIAGNOSIS — E1169 Type 2 diabetes mellitus with other specified complication: Secondary | ICD-10-CM | POA: Diagnosis not present

## 2023-01-31 LAB — CBC WITH DIFFERENTIAL/PLATELET
Absolute Monocytes: 659 cells/uL (ref 200–950)
Basophils Absolute: 27 cells/uL (ref 0–200)
Basophils Relative: 0.3 %
Eosinophils Absolute: 98 cells/uL (ref 15–500)
Eosinophils Relative: 1.1 %
HCT: 37.8 % (ref 35.0–45.0)
Hemoglobin: 12.6 g/dL (ref 11.7–15.5)
Lymphs Abs: 2804 cells/uL (ref 850–3900)
MCH: 28.2 pg (ref 27.0–33.0)
MCHC: 33.3 g/dL (ref 32.0–36.0)
MCV: 84.6 fL (ref 80.0–100.0)
MPV: 12 fL (ref 7.5–12.5)
Monocytes Relative: 7.4 %
Neutro Abs: 5313 cells/uL (ref 1500–7800)
Neutrophils Relative %: 59.7 %
Platelets: 163 10*3/uL (ref 140–400)
RBC: 4.47 10*6/uL (ref 3.80–5.10)
RDW: 13.6 % (ref 11.0–15.0)
Total Lymphocyte: 31.5 %
WBC: 8.9 10*3/uL (ref 3.8–10.8)

## 2023-01-31 LAB — COMPLETE METABOLIC PANEL WITH GFR
AG Ratio: 1.4 (calc) (ref 1.0–2.5)
ALT: 18 U/L (ref 6–29)
AST: 19 U/L (ref 10–35)
Albumin: 3.7 g/dL (ref 3.6–5.1)
Alkaline phosphatase (APISO): 55 U/L (ref 37–153)
BUN: 10 mg/dL (ref 7–25)
CO2: 25 mmol/L (ref 20–32)
Calcium: 9 mg/dL (ref 8.6–10.4)
Chloride: 106 mmol/L (ref 98–110)
Creat: 0.98 mg/dL (ref 0.60–1.00)
Globulin: 2.6 g/dL (calc) (ref 1.9–3.7)
Glucose, Bld: 105 mg/dL — ABNORMAL HIGH (ref 65–99)
Potassium: 3.1 mmol/L — ABNORMAL LOW (ref 3.5–5.3)
Sodium: 143 mmol/L (ref 135–146)
Total Bilirubin: 0.4 mg/dL (ref 0.2–1.2)
Total Protein: 6.3 g/dL (ref 6.1–8.1)
eGFR: 62 mL/min/{1.73_m2} (ref >=60)

## 2023-01-31 LAB — HEMOGLOBIN A1C
Hgb A1c MFr Bld: 6.2 % of total Hgb — ABNORMAL HIGH (ref <5.7)
Mean Plasma Glucose: 131 mg/dL
eAG (mmol/L): 7.3 mmol/L

## 2023-01-31 LAB — LIPID PANEL
Cholesterol: 128 mg/dL (ref <200)
HDL: 49 mg/dL — ABNORMAL LOW (ref >=50)
LDL Cholesterol (Calc): 56 mg/dL (calc)
Non-HDL Cholesterol (Calc): 79 mg/dL (calc) (ref <130)
Total CHOL/HDL Ratio: 2.6 (calc) (ref <5.0)
Triglycerides: 148 mg/dL (ref <150)

## 2023-02-02 ENCOUNTER — Encounter: Payer: Self-pay | Admitting: Nurse Practitioner

## 2023-02-02 ENCOUNTER — Ambulatory Visit: Payer: Medicare Other | Admitting: Nurse Practitioner

## 2023-02-02 VITALS — BP 140/78 | HR 92 | Temp 97.3°F | Ht 62.0 in | Wt 176.0 lb

## 2023-02-02 DIAGNOSIS — Z1212 Encounter for screening for malignant neoplasm of rectum: Secondary | ICD-10-CM

## 2023-02-02 DIAGNOSIS — I10 Essential (primary) hypertension: Secondary | ICD-10-CM

## 2023-02-02 DIAGNOSIS — F17200 Nicotine dependence, unspecified, uncomplicated: Secondary | ICD-10-CM | POA: Diagnosis not present

## 2023-02-02 DIAGNOSIS — Z1211 Encounter for screening for malignant neoplasm of colon: Secondary | ICD-10-CM

## 2023-02-02 DIAGNOSIS — E6609 Other obesity due to excess calories: Secondary | ICD-10-CM | POA: Diagnosis not present

## 2023-02-02 DIAGNOSIS — F209 Schizophrenia, unspecified: Secondary | ICD-10-CM

## 2023-02-02 DIAGNOSIS — M8580 Other specified disorders of bone density and structure, unspecified site: Secondary | ICD-10-CM

## 2023-02-02 DIAGNOSIS — E1169 Type 2 diabetes mellitus with other specified complication: Secondary | ICD-10-CM | POA: Diagnosis not present

## 2023-02-02 DIAGNOSIS — Z1159 Encounter for screening for other viral diseases: Secondary | ICD-10-CM

## 2023-02-02 DIAGNOSIS — Z6834 Body mass index (BMI) 34.0-34.9, adult: Secondary | ICD-10-CM

## 2023-02-02 DIAGNOSIS — E782 Mixed hyperlipidemia: Secondary | ICD-10-CM | POA: Diagnosis not present

## 2023-02-02 DIAGNOSIS — E876 Hypokalemia: Secondary | ICD-10-CM | POA: Diagnosis not present

## 2023-02-02 NOTE — Progress Notes (Signed)
Careteam: Patient Care Team: [name], NP as PCP - General (Nurse Practitioner)  PLACE OF SERVICE:  [facility] CLINIC  Advanced Directive information Does Patient Have a Medical Advance Directive?: Yes, Type of Advance Directive: Out of facility DNR (pink MOST or yellow form), Pre-existing out of facility DNR order (yellow form or pink MOST form): Yellow form placed in chart (order not valid for inpatient use), Does patient want to make changes to medical advance directive?: No - Patient declined  No Known Allergies  Chief Complaint  Patient presents with   Medical Management of Chronic Issues    3 month follow-up and discuss recent labs (copy printed). Discuss need for diabetic kidney evaluation, cologuard, shingrix, and covid boosters. Here with brother (in the lobby)      HPI: Patient is a 72 y.o. female for routine follow up.   Htn- blood pressure up today. She ate ham yesterday. Reports she took blood pressure medication today. Has a blood pressure cuff at home but does not use it since she an not see.   Hyperlipidemia- on lipitor.   Reports mood has been good- talks to psych routinely, next appt in July   Smokes occasionally. Reports she is ready to "let them go" Review of Systems:  Review of Systems  Constitutional:  Negative for chills, fever and weight loss.  HENT:  Negative for tinnitus.   Respiratory:  Negative for cough, sputum production and shortness of breath.   Cardiovascular:  Negative for chest pain, palpitations and leg swelling.  Gastrointestinal:  Negative for abdominal pain, constipation, diarrhea and heartburn.  Genitourinary:  Negative for dysuria, frequency and urgency.  Musculoskeletal:  Negative for back pain, falls, joint pain and myalgias.  Skin: Negative.   Neurological:  Negative for dizziness and headaches.  Psychiatric/Behavioral:  Negative for depression and memory loss. The patient does not have insomnia.     Past Medical History:   Diagnosis Date   Chlamydia trachomatis infection of unspecified genitourinary site 12/18/2011   Cough    Disorder of bone and cartilage, unspecified    Edema    Hypercalcemia 02/14/2015   Hyperlipemia    Hyperparathyroidism    Hypertension    Osteopenia    Other abnormal blood chemistry    Schizophrenia (HCC)    "little bit" (02/23/2015)   Tachycardia, unspecified 05/29/2010   Tobacco use disorder    Unspecified glaucoma(365.9)    Vitamin D deficiency    Past Surgical History:  Procedure Laterality Date   BREAST EXCISIONAL BIOPSY Left    EYE SURGERY Left 02/2022   NO PAST SURGERIES     PARATHYROIDECTOMY N/A 03/15/2015   Procedure: PARATHYROIDECTOMY AND NECK EXPLORATION;  Surgeon: Darnell Level, MD;  Location: [facility] OR;  Service: General;  Laterality: N/A;   REFRACTIVE SURGERY Left 08/2021   Social History:   reports that she has been smoking cigarettes. She has a 5.64 pack-year smoking history. She has never used smokeless tobacco. She reports current alcohol use. She reports that she does not use drugs.  Family History  Problem Relation Age of Onset   Cancer Mother    Cancer Brother        Throat   Hypertension Other    Diabetes Other     Medications: Patient's Medications  New Prescriptions   No medications on file  Previous Medications   ATENOLOL (TENORMIN) 50 MG TABLET    Take 1 tablet (50 mg total) by mouth daily.   ATORVASTATIN (LIPITOR) 40 MG TABLET  Take 1 tablet by mouth once daily   CALCIUM CARBONATE (OSCAL) 1500 (600 CA) MG TABS TABLET    Take 1,500 mg by mouth daily.   CHOLECALCIFEROL (VITAMIN D3) 25 MCG (1000 UT) TABLET    Take 1,000 Units by mouth daily.   MAGNESIUM GLUCONATE (MAGONATE) 500 MG TABLET    Take 1 tablet (500 mg total) by mouth daily.   PREDNISOLONE ACETATE (PRED FORTE) 1 % OPHTHALMIC SUSPENSION    Place 1 drop into the left eye 2 (two) times daily.   SODIUM CHLORIDE (MURO 128) 5 % OPHTHALMIC SOLUTION    Place 1 drop into the left eye 4 (four)  times daily.   THIORIDAZINE (MELLARIL) 100 MG TABLET    Take 100 mg by mouth at bedtime.  Modified Medications   No medications on file  Discontinued Medications   No medications on file    Physical Exam:  Vitals:   02/02/23 1338 02/02/23 1342  BP: (!) 144/78 (!) 140/78  Pulse: 92   Temp: (!) 97.3 F (36.3 C)   TempSrc: Temporal   SpO2: 99%   Weight: 176 lb (79.8 kg)   Height: 5\' 2"  (1.575 m)    Body mass index is 32.19 kg/m. Wt Readings from Last 3 Encounters:  02/02/23 176 lb (79.8 kg)  11/03/22 176 lb (79.8 kg)  08/25/22 186 lb 9.6 oz (84.6 kg)    Physical Exam Constitutional:      General: She is not in acute distress.    Appearance: She is well-developed. She is not diaphoretic.  HENT:     Head: Normocephalic and atraumatic.     Mouth/Throat:     Pharynx: No oropharyngeal exudate.  Eyes:     Comments: blind  Cardiovascular:     Rate and Rhythm: Normal rate and regular rhythm.     Heart sounds: Normal heart sounds.  Pulmonary:     Effort: Pulmonary effort is normal.     Breath sounds: Normal breath sounds.  Abdominal:     General: Bowel sounds are normal.     Palpations: Abdomen is soft.  Musculoskeletal:     Cervical back: Normal range of motion and neck supple.     Right lower leg: No edema.     Left lower leg: No edema.  Skin:    General: Skin is warm and dry.  Neurological:     Mental Status: She is alert.  Psychiatric:        Mood and Affect: Mood normal.    Labs reviewed: Basic Metabolic Panel: Recent Labs    04/22/22 1021 08/20/22 0935 01/30/23 1004  NA 142 143 143  K 3.9 3.8 3.1*  CL 107 106 106  CO2 24 24 25   GLUCOSE 126* 138* 105*  BUN 12 12 10   CREATININE 1.10* 0.94 0.98  CALCIUM 9.8 9.2 9.0   Liver Function Tests: Recent Labs    04/22/22 1021 08/20/22 0935 01/30/23 1004  AST 20 26 19   ALT 22 24 18   BILITOT 0.4 0.3 0.4  PROT 7.1 6.8 6.3   No results for input(s): "LIPASE", "AMYLASE" in the last 8760 hours. No  results for input(s): "AMMONIA" in the last 8760 hours. CBC: Recent Labs    04/22/22 1021 01/30/23 1004  WBC 6.0 8.9  NEUTROABS 2,712 5,313  HGB 13.3 12.6  HCT 39.7 37.8  MCV 86.7 84.6  PLT 173 163   Lipid Panel: Recent Labs    04/22/22 1021 08/20/22 0935 01/30/23 1004  CHOL 231* 172  128  HDL 53 53 49*  LDLCALC 138* 95 56  TRIG 261* 147 148  CHOLHDL 4.4 3.2 2.6   TSH: No results for input(s): "TSH" in the last 8760 hours. A1C: Lab Results  Component Value Date   HGBA1C 6.2 (H) 01/30/2023     Assessment/Plan 1. Type 2 diabetes mellitus with other specified complication, without long-term current use of insulin (HCC) -Encouraged dietary compliance, routine foot care/monitoring and to keep up with diabetic eye exams through ophthalmology  - Microalbumin/Creatinine Ratio, Urine  2. Mixed hyperlipidemia -LDL improved on lipitor, continue medication with dietary modifications  3. Schizophrenia, unspecified type (HCC) -stable on thioridazine, followed by psych  4. Class 1 obesity due to excess calories with serious comorbidity and body mass index (BMI) of 34.0 to 34.9 in adult --education provided on healthy weight loss through increase in physical activity and proper nutrition   5. Essential hypertension -elevated today but generally controlled -will have her take a home and notify us of her home readings Goal <140/90 -dash diet recommended  6. Encounter for colorectal cancer screening - Cologuard  7. Smoker -encouraged cessation .  8. Osteopenia, unspecified location -Recommended to take calcium 600 mg twice daily with Vitamin D 2000 units daily and weight bearing activity 30 mins/5 days a week  9. Need for hepatitis C screening test - Hepatitis C antibody  10. Hypokalemia -noted on recent labs, will follow up - BASIC METABOLIC PANEL WITH GFR  Return in about 4 months (around 06/05/2023) for routine follow up .:  Ettie Krontz K. Biagio [facility] [phone]

## 2023-02-02 NOTE — Patient Instructions (Signed)
To check blood pressure weekly, notify if blood pressure staying over 140/90

## 2023-02-03 ENCOUNTER — Other Ambulatory Visit: Payer: Self-pay

## 2023-02-03 LAB — MICROALBUMIN / CREATININE URINE RATIO
Creatinine, Urine: 210 mg/dL (ref 20–275)
Microalb Creat Ratio: 14 mg/g creat (ref <30)
Microalb, Ur: 2.9 mg/dL

## 2023-02-03 LAB — HEPATITIS C ANTIBODY: Hepatitis C Ab: NONREACTIVE

## 2023-02-03 LAB — BASIC METABOLIC PANEL WITH GFR
BUN: 13 mg/dL (ref 7–25)
CO2: 24 mmol/L (ref 20–32)
Calcium: 9.2 mg/dL (ref 8.6–10.4)
Chloride: 105 mmol/L (ref 98–110)
Creat: 0.9 mg/dL (ref 0.60–1.00)
Glucose, Bld: 105 mg/dL — ABNORMAL HIGH (ref 65–99)
Potassium: 3.3 mmol/L — ABNORMAL LOW (ref 3.5–5.3)
Sodium: 145 mmol/L (ref 135–146)
eGFR: 69 mL/min/{1.73_m2} (ref >=60)

## 2023-02-11 ENCOUNTER — Other Ambulatory Visit: Payer: Self-pay | Admitting: Nurse Practitioner

## 2023-02-11 DIAGNOSIS — R000 Tachycardia, unspecified: Secondary | ICD-10-CM

## 2023-02-11 DIAGNOSIS — I10 Essential (primary) hypertension: Secondary | ICD-10-CM

## 2023-02-17 DIAGNOSIS — Z1211 Encounter for screening for malignant neoplasm of colon: Secondary | ICD-10-CM | POA: Diagnosis not present

## 2023-02-17 DIAGNOSIS — Z1212 Encounter for screening for malignant neoplasm of rectum: Secondary | ICD-10-CM | POA: Diagnosis not present

## 2023-02-21 LAB — COLOGUARD: COLOGUARD: NEGATIVE

## 2023-03-31 DIAGNOSIS — H44511 Absolute glaucoma, right eye: Secondary | ICD-10-CM | POA: Diagnosis not present

## 2023-03-31 DIAGNOSIS — H401123 Primary open-angle glaucoma, left eye, severe stage: Secondary | ICD-10-CM | POA: Diagnosis not present

## 2023-03-31 DIAGNOSIS — H2589 Other age-related cataract: Secondary | ICD-10-CM | POA: Diagnosis not present

## 2023-04-22 ENCOUNTER — Other Ambulatory Visit: Payer: Self-pay | Admitting: Nurse Practitioner

## 2023-04-22 DIAGNOSIS — E782 Mixed hyperlipidemia: Secondary | ICD-10-CM

## 2023-06-01 ENCOUNTER — Ambulatory Visit (INDEPENDENT_AMBULATORY_CARE_PROVIDER_SITE_OTHER): Payer: Medicare Other | Admitting: Nurse Practitioner

## 2023-06-01 ENCOUNTER — Encounter: Payer: Self-pay | Admitting: Nurse Practitioner

## 2023-06-01 VITALS — BP 152/72 | HR 75 | Temp 97.1°F | Ht 62.0 in | Wt 172.0 lb

## 2023-06-01 DIAGNOSIS — F209 Schizophrenia, unspecified: Secondary | ICD-10-CM

## 2023-06-01 DIAGNOSIS — E782 Mixed hyperlipidemia: Secondary | ICD-10-CM

## 2023-06-01 DIAGNOSIS — Z23 Encounter for immunization: Secondary | ICD-10-CM | POA: Diagnosis not present

## 2023-06-01 DIAGNOSIS — E1169 Type 2 diabetes mellitus with other specified complication: Secondary | ICD-10-CM

## 2023-06-01 DIAGNOSIS — R000 Tachycardia, unspecified: Secondary | ICD-10-CM

## 2023-06-01 DIAGNOSIS — F17200 Nicotine dependence, unspecified, uncomplicated: Secondary | ICD-10-CM

## 2023-06-01 DIAGNOSIS — I10 Essential (primary) hypertension: Secondary | ICD-10-CM | POA: Diagnosis not present

## 2023-06-01 MED ORDER — ATENOLOL 100 MG PO TABS
100.0000 mg | ORAL_TABLET | Freq: Every day | ORAL | 0 refills | Status: DC
Start: 2023-06-01 — End: 2023-08-17

## 2023-06-01 NOTE — Patient Instructions (Addendum)
Increase atenolol to 100 mg daily   Recommend to stop smoking

## 2023-06-01 NOTE — Progress Notes (Signed)
Careteam: Patient Care Team: [name], NP as PCP - General (Nurse Practitioner)  PLACE OF SERVICE:  [facility] CLINIC  Advanced Directive information Does Patient Have a Medical Advance Directive?: Yes, Type of Advance Directive: Out of facility DNR (pink MOST or yellow form), Pre-existing out of facility DNR order (yellow form or pink MOST form): Yellow form placed in chart (order not valid for inpatient use), Does patient want to make changes to medical advance directive?: No - Patient declined  No Known Allergies  Chief Complaint  Patient presents with   Medical Management of Chronic Issues    4 month follow-up. Discuss need for shingrix, covid booster, and flu vaccine. Discuss magnesium dosage, patient questions if she should be on 600 mg versus 500. Here with sister     HPI: Patient is a 72 y.o. female presents for a 37-month follow-up. Pt with hx of blindness due to glaucoma, hyperlipidemia, schizophrenia, osteoporosis, GERD, Htn.  No current concerns.  BP is elevated today. Has been eating a "little too much" pork lately, she loves sausage and bacon. Has been trying to limit herself to 2 pieces. Is also eating more fruits and vegetables. States she had taken some blood pressures at home but forgot her readings at home.  Taking medications as prescribed. Keeping up appointments with psych.  She is still smoking, 1 pack lasts her about 2-3 days.  Uses a cane for her eyesight (not for balance), had surgery on her left eye. Denies falls.  Review of Systems:  Review of Systems  Constitutional:  Negative for chills, fever and weight loss.  Respiratory:  Negative for cough and shortness of breath.   Cardiovascular:  Negative for chest pain and palpitations.  Gastrointestinal:  Negative for abdominal pain, constipation, diarrhea, nausea and vomiting.  Genitourinary:  Negative for dysuria, frequency and urgency.  Musculoskeletal:  Negative for back pain and neck pain.   Neurological:  Negative for dizziness, weakness and headaches.  Psychiatric/Behavioral:  Negative for depression and hallucinations. The patient is not nervous/anxious and does not have insomnia.     Past Medical History:  Diagnosis Date   Chlamydia trachomatis infection of unspecified genitourinary site 12/18/2011   Cough    Disorder of bone and cartilage, unspecified    Edema    Hypercalcemia 02/14/2015   Hyperlipemia    Hyperparathyroidism    Hypertension    Osteopenia    Other abnormal blood chemistry    Schizophrenia (HCC)    "little bit" (02/23/2015)   Tachycardia, unspecified 05/29/2010   Tobacco use disorder    Unspecified glaucoma(365.9)    Vitamin D deficiency    Past Surgical History:  Procedure Laterality Date   BREAST EXCISIONAL BIOPSY Left    EYE SURGERY Left 02/2022   NO PAST SURGERIES     PARATHYROIDECTOMY N/A 03/15/2015   Procedure: PARATHYROIDECTOMY AND NECK EXPLORATION;  Surgeon: Darnell Level, MD;  Location: [facility] OR;  Service: General;  Laterality: N/A;   REFRACTIVE SURGERY Left 08/2021   Social History:   reports that she has been smoking cigarettes. She has a 5.6 pack-year smoking history. She has never used smokeless tobacco. She reports current alcohol use. She reports that she does not use drugs.  Family History  Problem Relation Age of Onset   Cancer Mother    Cancer Brother        Throat   Hypertension Other    Diabetes Other     Medications: Patient's Medications  New Prescriptions   No  medications on file  Previous Medications   ATORVASTATIN (LIPITOR) 40 MG TABLET    Take 1 tablet by mouth once daily   CALCIUM PO    Take 600 mg by mouth daily.   CHOLECALCIFEROL (VITAMIN D3 SUPER STRENGTH) 50 MCG (2000 UT) TABS    Take 1 tablet by mouth daily.   MAGNESIUM GLUCONATE (MAGONATE) 500 MG TABLET    Take 1 tablet (500 mg total) by mouth daily.   PREDNISOLONE ACETATE (PRED FORTE) 1 % OPHTHALMIC SUSPENSION    Place 1 drop into the left eye 2 (two)  times daily.   SODIUM CHLORIDE (MURO 128) 5 % OPHTHALMIC SOLUTION    Place 1 drop into the left eye 4 (four) times daily.   THIORIDAZINE (MELLARIL) 100 MG TABLET    Take 100 mg by mouth at bedtime.  Modified Medications   Modified Medication Previous Medication   ATENOLOL (TENORMIN) 100 MG TABLET atenolol (TENORMIN) 50 MG tablet      Take 1 tablet (100 mg total) by mouth daily.    Take 1 tablet by mouth once daily  Discontinued Medications   No medications on file    Physical Exam:  Vitals:   06/01/23 1311 06/01/23 1314  BP: (!) 152/70 (!) 152/72  Pulse: 75   Temp: (!) 97.1 F (36.2 C)   TempSrc: Temporal   SpO2: 98%   Weight: 78 kg   Height: 5\' 2"  (1.575 m)    Body mass index is 31.46 kg/m. Wt Readings from Last 3 Encounters:  06/01/23 78 kg  02/02/23 79.8 kg  11/03/22 79.8 kg    Physical Exam Constitutional:      Appearance: Normal appearance.  Cardiovascular:     Rate and Rhythm: Normal rate and regular rhythm.     Pulses: Normal pulses.     Heart sounds: Normal heart sounds.  Pulmonary:     Effort: Pulmonary effort is normal.     Breath sounds: Normal breath sounds.  Abdominal:     General: Bowel sounds are normal.     Palpations: Abdomen is soft.  Musculoskeletal:        General: Normal range of motion.     Cervical back: Normal range of motion.  Skin:    General: Skin is warm and dry.  Neurological:     General: No focal deficit present.     Mental Status: She is alert and oriented to person, place, and time. Mental status is at baseline.  Psychiatric:        Mood and Affect: Mood normal.        Behavior: Behavior normal.     Labs reviewed: Basic Metabolic Panel: Recent Labs    08/20/22 0935 01/30/23 1004 02/02/23 1419  NA 143 143 145  K 3.8 3.1* 3.3*  CL 106 106 105  CO2 24 25 24   GLUCOSE 138* 105* 105*  BUN 12 10 13   CREATININE 0.94 0.98 0.90  CALCIUM 9.2 9.0 9.2   Liver Function Tests: Recent Labs    08/20/22 0935 01/30/23 1004   AST 26 19  ALT 24 18  BILITOT 0.3 0.4  PROT 6.8 6.3   No results for input(s): "LIPASE", "AMYLASE" in the last 8760 hours. No results for input(s): "AMMONIA" in the last 8760 hours. CBC: Recent Labs    01/30/23 1004  WBC 8.9  NEUTROABS 5,313  HGB 12.6  HCT 37.8  MCV 84.6  PLT 163   Lipid Panel: Recent Labs    08/20/22 0935 01/30/23  1004  CHOL 172 128  HDL 53 49*  LDLCALC 95 56  TRIG 147 148  CHOLHDL 3.2 2.6   TSH: No results for input(s): "TSH" in the last 8760 hours. A1C: Lab Results  Component Value Date   HGBA1C 6.2 (H) 01/30/2023     Assessment/Plan 1. Type 2 diabetes mellitus with other specified complication, without long-term current use of insulin (HCC) -Encouraged dietary compliance, routine foot care/monitoring and to keep up with diabetic eye exams through ophthalmology  - Hemoglobin A1c  2. Mixed hyperlipidemia -Encouraged dietary modifications Continues on statin.   3. Schizophrenia, unspecified type (HCC) -mood and behaviors stable on current regimen, Continue thioridazine/Mellaril  4. Essential hypertension -Encouraged dietary modifications, avoid high sodium food, NAS.  - COMPLETE METABOLIC PANEL WITH GFR - CBC with Differential/Platelet -Increase atenolol due to uncontrolled htn.  - atenolol (TENORMIN) 100 MG tablet; Take 1 tablet (100 mg total) by mouth daily.  Dispense: 30 tablet; Refill: 0 -Recheck in 4 weeks  5. Smoker -Smoking cessation encouraged  6. Need for influenza vaccination - Flu Vaccine Trivalent High Dose (Fluad)  7. Tachycardia - atenolol (TENORMIN) 100 MG tablet; Take 1 tablet (100 mg total) by mouth daily.  Dispense: 30 tablet; Refill: 0   Return in about 4 weeks (around 06/29/2023) for blood pressure check .  Rollen Sox, FNP-MSN Student I personally was present during the history, physical exam and medical decision-making activities of this service and have verified that the service and findings  are accurately documented in the student's note  Abbey Chatters, NP

## 2023-06-02 LAB — CBC WITH DIFFERENTIAL/PLATELET
Absolute Monocytes: 705 {cells}/uL (ref 200–950)
Basophils Absolute: 17 {cells}/uL (ref 0–200)
Basophils Relative: 0.2 %
Eosinophils Absolute: 26 {cells}/uL (ref 15–500)
Eosinophils Relative: 0.3 %
HCT: 39.4 % (ref 35.0–45.0)
Hemoglobin: 13.3 g/dL (ref 11.7–15.5)
Lymphs Abs: 2700 {cells}/uL (ref 850–3900)
MCH: 28.5 pg (ref 27.0–33.0)
MCHC: 33.8 g/dL (ref 32.0–36.0)
MCV: 84.5 fL (ref 80.0–100.0)
MPV: 12.4 fL (ref 7.5–12.5)
Monocytes Relative: 8.2 %
Neutro Abs: 5151 {cells}/uL (ref 1500–7800)
Neutrophils Relative %: 59.9 %
Platelets: 164 10*3/uL (ref 140–400)
RBC: 4.66 10*6/uL (ref 3.80–5.10)
RDW: 13.7 % (ref 11.0–15.0)
Total Lymphocyte: 31.4 %
WBC: 8.6 10*3/uL (ref 3.8–10.8)

## 2023-06-02 LAB — COMPLETE METABOLIC PANEL WITH GFR
AG Ratio: 1.5 (calc) (ref 1.0–2.5)
ALT: 17 U/L (ref 6–29)
AST: 16 U/L (ref 10–35)
Albumin: 4.1 g/dL (ref 3.6–5.1)
Alkaline phosphatase (APISO): 63 U/L (ref 37–153)
BUN: 7 mg/dL (ref 7–25)
CO2: 26 mmol/L (ref 20–32)
Calcium: 9.5 mg/dL (ref 8.6–10.4)
Chloride: 107 mmol/L (ref 98–110)
Creat: 0.93 mg/dL (ref 0.60–1.00)
Globulin: 2.8 g/dL (ref 1.9–3.7)
Glucose, Bld: 138 mg/dL — ABNORMAL HIGH (ref 65–99)
Potassium: 3.4 mmol/L — ABNORMAL LOW (ref 3.5–5.3)
Sodium: 144 mmol/L (ref 135–146)
Total Bilirubin: 0.6 mg/dL (ref 0.2–1.2)
Total Protein: 6.9 g/dL (ref 6.1–8.1)
eGFR: 66 mL/min/{1.73_m2} (ref >=60)

## 2023-06-02 LAB — HEMOGLOBIN A1C
Hgb A1c MFr Bld: 6.4 %{Hb} — ABNORMAL HIGH (ref <5.7)
Mean Plasma Glucose: 137 mg/dL
eAG (mmol/L): 7.6 mmol/L

## 2023-06-03 ENCOUNTER — Telehealth: Payer: Medicare Other

## 2023-06-03 NOTE — Telephone Encounter (Signed)
-----   Message from [name] sent at 06/02/2023  9:53 AM EDT ----- Blood sugars and A1c elevated, need to limit sugars and starchy foods Potassium borderline low- increase potassium in diet. Oranges, banana and sweet potatoes.  Blood counts stable.

## 2023-06-29 ENCOUNTER — Encounter: Payer: Self-pay | Admitting: Nurse Practitioner

## 2023-06-29 ENCOUNTER — Ambulatory Visit (INDEPENDENT_AMBULATORY_CARE_PROVIDER_SITE_OTHER): Payer: Medicare Other | Admitting: Nurse Practitioner

## 2023-06-29 VITALS — BP 142/78 | HR 80 | Temp 97.9°F | Ht 62.0 in | Wt 179.0 lb

## 2023-06-29 DIAGNOSIS — R000 Tachycardia, unspecified: Secondary | ICD-10-CM

## 2023-06-29 DIAGNOSIS — F17200 Nicotine dependence, unspecified, uncomplicated: Secondary | ICD-10-CM

## 2023-06-29 DIAGNOSIS — E1169 Type 2 diabetes mellitus with other specified complication: Secondary | ICD-10-CM | POA: Diagnosis not present

## 2023-06-29 DIAGNOSIS — I10 Essential (primary) hypertension: Secondary | ICD-10-CM | POA: Diagnosis not present

## 2023-06-29 MED ORDER — LOSARTAN POTASSIUM 25 MG PO TABS: 25.0000 mg | ORAL_TABLET | Freq: Every day | ORAL | 0 refills | Status: DC

## 2023-06-29 NOTE — Patient Instructions (Signed)
Continue all medications and ADD losartan 25 mg daily   Goal bp is LESS THAN 140/90  Continue to work on cutting back on salt/sodium in diet.   Decrease sugar/starchy/carbohydrates in diet.

## 2023-06-29 NOTE — Progress Notes (Signed)
Careteam: Patient Care Team: [name], NP as PCP - General (Nurse Practitioner)  PLACE OF SERVICE:  [facility] CLINIC  Advanced Directive information    No Known Allergies  Chief Complaint  Patient presents with   Follow-up    4 week blood pressure follow-up.      HPI: Patient is a 72 y.o. female presents for blood pressure follow-up. Trying to do some more walking and she enjoys dancing; reports she's "not stiff so she can still move well." Staying hydrated. Not adding salt to her food, but eating things such as hot dogs, states she has cut back on eating pork.  Reports smoking less, 1-2 cigarettes per day, she's busier doing things with her sisters so she doesn't have time to smoke; she used to smoke more because she was just bored sitting around all day.   No current concerns.   Review of Systems:  Review of Systems  Respiratory:  Negative for cough and shortness of breath.   Cardiovascular:  Negative for chest pain, palpitations and leg swelling.  Neurological:  Negative for dizziness, weakness and headaches.    Past Medical History:  Diagnosis Date   Chlamydia trachomatis infection of unspecified genitourinary site 12/18/2011   Cough    Disorder of bone and cartilage, unspecified    Edema    Hypercalcemia 02/14/2015   Hyperlipemia    Hyperparathyroidism    Hypertension    Osteopenia    Other abnormal blood chemistry    Schizophrenia (HCC)    "little bit" (02/23/2015)   Tachycardia, unspecified 05/29/2010   Tobacco use disorder    Unspecified glaucoma(365.9)    Vitamin D deficiency    Past Surgical History:  Procedure Laterality Date   BREAST EXCISIONAL BIOPSY Left    EYE SURGERY Left 02/2022   NO PAST SURGERIES     PARATHYROIDECTOMY N/A 03/15/2015   Procedure: PARATHYROIDECTOMY AND NECK EXPLORATION;  Surgeon: Darnell Level, MD;  Location: [facility] OR;  Service: General;  Laterality: N/A;   REFRACTIVE SURGERY Left 08/2021   Social History:   reports that  she has been smoking cigarettes. She has a 5.6 pack-year smoking history. She has never used smokeless tobacco. She reports current alcohol use. She reports that she does not use drugs.  Family History  Problem Relation Age of Onset   Cancer Mother    Cancer Brother        Throat   Hypertension Other    Diabetes Other     Medications: Patient's Medications  New Prescriptions   LOSARTAN (COZAAR) 25 MG TABLET    Take 1 tablet (25 mg total) by mouth daily.  Previous Medications   ATENOLOL (TENORMIN) 100 MG TABLET    Take 1 tablet (100 mg total) by mouth daily.   ATORVASTATIN (LIPITOR) 40 MG TABLET    Take 1 tablet by mouth once daily   CALCIUM PO    Take 600 mg by mouth daily.   CHOLECALCIFEROL (VITAMIN D3 SUPER STRENGTH) 50 MCG (2000 UT) TABS    Take 1 tablet by mouth daily.   MAGNESIUM GLUCONATE (MAGONATE) 500 MG TABLET    Take 1 tablet (500 mg total) by mouth daily.   PREDNISOLONE ACETATE (PRED FORTE) 1 % OPHTHALMIC SUSPENSION    Place 1 drop into the left eye 2 (two) times daily.   SODIUM CHLORIDE (MURO 128) 5 % OPHTHALMIC SOLUTION    Place 1 drop into the left eye 4 (four) times daily.   THIORIDAZINE (MELLARIL) 100 MG  TABLET    Take 100 mg by mouth at bedtime.  Modified Medications   No medications on file  Discontinued Medications   No medications on file    Physical Exam:  Vitals:   06/29/23 1107 06/29/23 1324 06/29/23 1355  BP: (!) 144/80 138/82 (!) 142/78  Pulse: 80    Temp: 97.9 F (36.6 C)    TempSrc: Temporal    SpO2: 98%    Weight: 81.2 kg    Height: 5\' 2"  (1.575 m)     Body mass index is 32.74 kg/m. Wt Readings from Last 3 Encounters:  06/29/23 81.2 kg  06/01/23 78 kg  02/02/23 79.8 kg    Physical Exam Constitutional:      Appearance: Normal appearance.  Cardiovascular:     Rate and Rhythm: Normal rate and regular rhythm.     Pulses: Normal pulses.     Heart sounds: Normal heart sounds.  Pulmonary:     Effort: Pulmonary effort is normal.      Breath sounds: Normal breath sounds.  Abdominal:     General: Bowel sounds are normal.     Palpations: Abdomen is soft.  Musculoskeletal:     Right lower leg: No edema.     Left lower leg: No edema.  Skin:    General: Skin is warm and dry.  Neurological:     Mental Status: She is alert. Mental status is at baseline.  Psychiatric:        Mood and Affect: Mood normal.        Behavior: Behavior normal.    Labs reviewed: Basic Metabolic Panel: Recent Labs    01/30/23 1004 02/02/23 1419 06/01/23 1343  NA 143 145 144  K 3.1* 3.3* 3.4*  CL 106 105 107  CO2 25 24 26   GLUCOSE 105* 105* 138*  BUN 10 13 7   CREATININE 0.98 0.90 0.93  CALCIUM 9.0 9.2 9.5   Liver Function Tests: Recent Labs    08/20/22 0935 01/30/23 1004 06/01/23 1343  AST 26 19 16   ALT 24 18 17   BILITOT 0.3 0.4 0.6  PROT 6.8 6.3 6.9   No results for input(s): "LIPASE", "AMYLASE" in the last 8760 hours. No results for input(s): "AMMONIA" in the last 8760 hours. CBC: Recent Labs    01/30/23 1004 06/01/23 1343  WBC 8.9 8.6  NEUTROABS 5,313 5,151  HGB 12.6 13.3  HCT 37.8 39.4  MCV 84.6 84.5  PLT 163 164   Lipid Panel: Recent Labs    08/20/22 0935 01/30/23 1004  CHOL 172 128  HDL 53 49*  LDLCALC 95 56  TRIG 147 148  CHOLHDL 3.2 2.6   TSH: No results for input(s): "TSH" in the last 8760 hours. A1C: Lab Results  Component Value Date   HGBA1C 6.4 (H) 06/01/2023    Assessment/Plan 1. Essential hypertension -Improved, not at goal yet; goal<140/90 -Encouraged dietary modifications-DASH diet and physical activity as tolerated -Continue atenolol -Start losartan - losartan (COZAAR) 25 MG tablet; Take 1 tablet (25 mg total) by mouth daily.  Dispense: 90 tablet; Refill: 0 - COMPLETE METABOLIC PANEL WITH GFR; Future - CBC with Differential/Platelet; Future  2. Tachycardia -Stable, improved HR with increase in atenolol, continue current dose  3. Smoker -Smoking cessation encouraged, states  she can quit completely if she wanted to  4. Type 2 diabetes mellitus with other specified complication, without long-term current use of insulin (HCC) -Encouraged dietary modifications (decrease starches/sugars) and physical activity as tolerated - losartan (COZAAR) 25 MG  tablet; Take 1 tablet (25 mg total) by mouth daily.  Dispense: 90 tablet; Refill: 0 - Hemoglobin A1c; Future   Return in about 3 months (around 09/29/2023) for routine follow up- lab prior .  Rollen Sox, FNP-MSN Student I personally was present during the history, physical exam and medical decision-making activities of this service and have verified that the service and findings are accurately documented in the student's note Abbey Chatters, NP

## 2023-07-21 ENCOUNTER — Other Ambulatory Visit: Payer: Self-pay | Admitting: Nurse Practitioner

## 2023-07-21 DIAGNOSIS — E782 Mixed hyperlipidemia: Secondary | ICD-10-CM

## 2023-08-04 DIAGNOSIS — H2589 Other age-related cataract: Secondary | ICD-10-CM | POA: Diagnosis not present

## 2023-08-04 DIAGNOSIS — H401123 Primary open-angle glaucoma, left eye, severe stage: Secondary | ICD-10-CM | POA: Diagnosis not present

## 2023-08-04 DIAGNOSIS — H44511 Absolute glaucoma, right eye: Secondary | ICD-10-CM | POA: Diagnosis not present

## 2023-08-15 ENCOUNTER — Other Ambulatory Visit: Payer: Self-pay | Admitting: Nurse Practitioner

## 2023-08-15 DIAGNOSIS — R000 Tachycardia, unspecified: Secondary | ICD-10-CM

## 2023-08-15 DIAGNOSIS — I10 Essential (primary) hypertension: Secondary | ICD-10-CM

## 2023-08-17 ENCOUNTER — Encounter (INDEPENDENT_AMBULATORY_CARE_PROVIDER_SITE_OTHER): Payer: Medicare Other | Admitting: Nurse Practitioner

## 2023-08-17 NOTE — Progress Notes (Signed)
This encounter was created in error - please disregard.

## 2023-08-17 NOTE — Telephone Encounter (Signed)
Patient is requesting refill on medication Atenolol 100mg . Patient medication last refilled 06/01/2023. Patient was given 30 tablets. Medication pend and sent to PCP Janyth Contes Janene Harvey, NP for approval.

## 2023-09-18 ENCOUNTER — Other Ambulatory Visit: Payer: Self-pay | Admitting: Nurse Practitioner

## 2023-09-18 DIAGNOSIS — R000 Tachycardia, unspecified: Secondary | ICD-10-CM

## 2023-09-18 DIAGNOSIS — I10 Essential (primary) hypertension: Secondary | ICD-10-CM

## 2023-09-18 MED ORDER — ATENOLOL 100 MG PO TABS: 100.0000 mg | ORAL_TABLET | Freq: Every day | ORAL | 0 refills | Status: DC

## 2023-09-18 NOTE — Telephone Encounter (Signed)
Patient was given 30 day supply. Medication pend and sent to PCP Janyth Contes Janene Harvey, NP for approval.

## 2023-09-21 ENCOUNTER — Encounter: Payer: Medicare Other | Admitting: Nurse Practitioner

## 2023-09-21 NOTE — Progress Notes (Signed)
   This service is provided via telemedicine  No vital signs collected/recorded due to the encounter was a telemedicine visit.   Location of patient (ex: home, work):  Home  Patient consents to a telephone visit: Yes  Location of the provider (ex: office, home):  [facility], Office   Name of any referring provider:  N/A  Names of all persons participating in the telemedicine service and their role in the encounter:  S.Chrae B/CMA, Abbey Chatters, NP, Louie Casa (sister), and Patient   Time spent on call:  11 min with medical assistant

## 2023-09-21 NOTE — Progress Notes (Signed)
This encounter was created in error - please disregard.

## 2023-09-22 ENCOUNTER — Other Ambulatory Visit: Payer: Self-pay | Admitting: Nurse Practitioner

## 2023-09-22 DIAGNOSIS — I10 Essential (primary) hypertension: Secondary | ICD-10-CM

## 2023-09-22 DIAGNOSIS — E1169 Type 2 diabetes mellitus with other specified complication: Secondary | ICD-10-CM

## 2023-09-29 ENCOUNTER — Other Ambulatory Visit: Payer: Medicare Other

## 2023-09-29 DIAGNOSIS — E1169 Type 2 diabetes mellitus with other specified complication: Secondary | ICD-10-CM

## 2023-09-29 DIAGNOSIS — I10 Essential (primary) hypertension: Secondary | ICD-10-CM

## 2023-09-30 LAB — CBC WITH DIFFERENTIAL/PLATELET
Absolute Lymphocytes: 3315 {cells}/uL (ref 850–3900)
Absolute Monocytes: 747 {cells}/uL (ref 200–950)
Basophils Absolute: 30 {cells}/uL (ref 0–200)
Basophils Relative: 0.4 %
Eosinophils Absolute: 104 {cells}/uL (ref 15–500)
Eosinophils Relative: 1.4 %
HCT: 41.8 % (ref 35.0–45.0)
Hemoglobin: 13.6 g/dL (ref 11.7–15.5)
MCH: 28.3 pg (ref 27.0–33.0)
MCHC: 32.5 g/dL (ref 32.0–36.0)
MCV: 87.1 fL (ref 80.0–100.0)
MPV: 12.3 fL (ref 7.5–12.5)
Monocytes Relative: 10.1 %
Neutro Abs: 3204 {cells}/uL (ref 1500–7800)
Neutrophils Relative %: 43.3 %
Platelets: 168 10*3/uL (ref 140–400)
RBC: 4.8 10*6/uL (ref 3.80–5.10)
RDW: 13.1 % (ref 11.0–15.0)
Total Lymphocyte: 44.8 %
WBC: 7.4 10*3/uL (ref 3.8–10.8)

## 2023-09-30 LAB — COMPLETE METABOLIC PANEL WITH GFR
AG Ratio: 1.4 (calc) (ref 1.0–2.5)
ALT: 21 U/L (ref 6–29)
AST: 21 U/L (ref 10–35)
Albumin: 4.2 g/dL (ref 3.6–5.1)
Alkaline phosphatase (APISO): 62 U/L (ref 37–153)
BUN/Creatinine Ratio: 13 (calc) (ref 6–22)
BUN: 15 mg/dL (ref 7–25)
CO2: 26 mmol/L (ref 20–32)
Calcium: 9.5 mg/dL (ref 8.6–10.4)
Chloride: 104 mmol/L (ref 98–110)
Creat: 1.15 mg/dL — ABNORMAL HIGH (ref 0.60–1.00)
Globulin: 3 g/dL (ref 1.9–3.7)
Glucose, Bld: 108 mg/dL — ABNORMAL HIGH (ref 65–99)
Potassium: 4.3 mmol/L (ref 3.5–5.3)
Sodium: 141 mmol/L (ref 135–146)
Total Bilirubin: 0.4 mg/dL (ref 0.2–1.2)
Total Protein: 7.2 g/dL (ref 6.1–8.1)
eGFR: 51 mL/min/{1.73_m2} — ABNORMAL LOW (ref >=60)

## 2023-09-30 LAB — HEMOGLOBIN A1C
Hgb A1c MFr Bld: 6.2 %{Hb} — ABNORMAL HIGH (ref <5.7)
Mean Plasma Glucose: 131 mg/dL
eAG (mmol/L): 7.3 mmol/L

## 2023-10-02 ENCOUNTER — Ambulatory Visit: Payer: Medicare Other | Admitting: Nurse Practitioner

## 2023-10-08 ENCOUNTER — Telehealth: Payer: Self-pay

## 2023-10-08 DIAGNOSIS — F209 Schizophrenia, unspecified: Secondary | ICD-10-CM

## 2023-10-08 NOTE — Telephone Encounter (Signed)
Patient would need a referral stating she is being seen by Venia Minks, DNP for mental health provider at [facility] that she has been seeing from 05/15/2021. The Fax number is [phone] and call baclk  [phone] call back. Mon-Thursday 9-5. Shelby Dubin is the Facilities manager.

## 2023-10-08 NOTE — Telephone Encounter (Signed)
Who is requesting this information? I do not have these records she will have to call [facility] for that.

## 2023-10-09 NOTE — Telephone Encounter (Signed)
The person requesting this information is the Barrister's clerk at [employer]. She states the patient has been seeing the NP for mental health there but is now needing a referral.

## 2023-10-12 ENCOUNTER — Encounter: Payer: Medicare Other | Admitting: Nurse Practitioner

## 2023-10-12 ENCOUNTER — Other Ambulatory Visit: Payer: Self-pay | Admitting: Nurse Practitioner

## 2023-10-12 DIAGNOSIS — I10 Essential (primary) hypertension: Secondary | ICD-10-CM

## 2023-10-12 DIAGNOSIS — R000 Tachycardia, unspecified: Secondary | ICD-10-CM

## 2023-10-12 NOTE — Telephone Encounter (Signed)
Referral placed.

## 2023-10-12 NOTE — Progress Notes (Signed)
   This service is provided via telemedicine  No vital signs collected/recorded due to the encounter was a telemedicine visit.   Location of patient (ex: home, work):  Home  Patient consents to a telephone visit: Yes  Location of the provider (ex: office, home):  [facility] and Adult [facility], Office   Name of any referring provider:  N/A  Names of all persons participating in the telemedicine service and their role in the encounter:  S.Chrae B/CMA, Abbey Chatters, NP, Louie Casa (patients sister), and Patient   Time spent on call:  9 min with medical assistant

## 2023-10-12 NOTE — Progress Notes (Signed)
 This encounter was created in error - please disregard.

## 2023-10-12 NOTE — Addendum Note (Signed)
Addended by: [name] on: 10/12/2023 10:59 AM   Modules accepted: Orders

## 2023-10-16 ENCOUNTER — Ambulatory Visit (INDEPENDENT_AMBULATORY_CARE_PROVIDER_SITE_OTHER): Payer: Medicare Other | Admitting: Nurse Practitioner

## 2023-10-16 ENCOUNTER — Encounter: Payer: Self-pay | Admitting: Nurse Practitioner

## 2023-10-16 VITALS — BP 124/80 | HR 76 | Temp 97.6°F | Resp 17 | Ht 62.0 in | Wt 178.6 lb

## 2023-10-16 DIAGNOSIS — I10 Essential (primary) hypertension: Secondary | ICD-10-CM | POA: Diagnosis not present

## 2023-10-16 DIAGNOSIS — F17200 Nicotine dependence, unspecified, uncomplicated: Secondary | ICD-10-CM | POA: Diagnosis not present

## 2023-10-16 DIAGNOSIS — E782 Mixed hyperlipidemia: Secondary | ICD-10-CM | POA: Diagnosis not present

## 2023-10-16 DIAGNOSIS — F209 Schizophrenia, unspecified: Secondary | ICD-10-CM

## 2023-10-16 DIAGNOSIS — E1169 Type 2 diabetes mellitus with other specified complication: Secondary | ICD-10-CM

## 2023-10-16 DIAGNOSIS — R000 Tachycardia, unspecified: Secondary | ICD-10-CM

## 2023-10-16 NOTE — Telephone Encounter (Signed)
Attempted to call [facility] to see if they have received the referral LWM for [name] the nursing manager to return the call

## 2023-10-16 NOTE — Progress Notes (Signed)
Careteam: Patient Care Team: [name], NP as PCP - General (Nurse Practitioner)  PLACE OF SERVICE:  [facility]  Advanced Directive information Does Patient Have a Medical Advance Directive?: Yes, Type of Advance Directive: Out of facility DNR (pink MOST or yellow form), Pre-existing out of facility DNR order (yellow form or pink MOST form): Yellow form placed in chart (order not valid for inpatient use), Does patient want to make changes to medical advance directive?: No - Patient declined  No Known Allergies  Chief Complaint  Patient presents with   Medical Management of Chronic Issues    3 month follow up and discuss covid vaccine and Medicare Annual Wellness Visit.     HPI: Patient is a 73 y.o. female for routine follow up.   She reports she feels great. Once she gets up and gets going she does good. She lives with a house mate to help her.  Her sisters also help her.   Continues to smoke every once in a while.   Referral made for psychiatrist early this month- they called to request new referral.  Mood has been doing well.    Review of Systems:  Review of Systems  Constitutional:  Negative for chills, fever and weight loss.  HENT:  Negative for tinnitus.   Eyes:        Legally blind  Respiratory:  Negative for cough, sputum production and shortness of breath.   Cardiovascular:  Negative for chest pain, palpitations and leg swelling.  Gastrointestinal:  Negative for abdominal pain, constipation, diarrhea and heartburn.  Genitourinary:  Negative for dysuria, frequency and urgency.  Musculoskeletal:  Negative for back pain, falls, joint pain and myalgias.  Skin: Negative.   Neurological:  Negative for dizziness and headaches.  Psychiatric/Behavioral:  Negative for depression and memory loss. The patient does not have insomnia.    Past Medical History:  Diagnosis Date   Chlamydia trachomatis infection of unspecified genitourinary site 12/18/2011   Cough     Disorder of bone and cartilage, unspecified    Edema    Hypercalcemia 02/14/2015   Hyperlipemia    Hyperparathyroidism    Hypertension    Osteopenia    Other abnormal blood chemistry    Schizophrenia (HCC)    "little bit" (02/23/2015)   Tachycardia, unspecified 05/29/2010   Tobacco use disorder    Unspecified glaucoma(365.9)    Vitamin D deficiency    Past Surgical History:  Procedure Laterality Date   BREAST EXCISIONAL BIOPSY Left    EYE SURGERY Left 02/2022   NO PAST SURGERIES     PARATHYROIDECTOMY N/A 03/15/2015   Procedure: PARATHYROIDECTOMY AND NECK EXPLORATION;  Surgeon: Darnell Level, MD;  Location: [facility] OR;  Service: General;  Laterality: N/A;   REFRACTIVE SURGERY Left 08/2021   Social History:   reports that she has been smoking cigarettes. She has a 5.6 pack-year smoking history. She has never used smokeless tobacco. She reports current alcohol use. She reports that she does not use drugs.  Family History  Problem Relation Age of Onset   Cancer Mother    Cancer Brother        Throat   Hypertension Other    Diabetes Other     Medications: Patient's Medications  New Prescriptions   No medications on file  Previous Medications   ATENOLOL (TENORMIN) 100 MG TABLET    Take 1 tablet by mouth once daily   ATORVASTATIN (LIPITOR) 40 MG TABLET    Take 1 tablet by  mouth once daily   CALCIUM PO    Take 600 mg by mouth daily.   CHOLECALCIFEROL (VITAMIN D3 SUPER STRENGTH) 50 MCG (2000 UT) TABS    Take 1 tablet by mouth daily.   LOSARTAN (COZAAR) 25 MG TABLET    Take 1 tablet by mouth once daily   MAGNESIUM GLUCONATE (MAGONATE) 500 MG TABLET    Take 1 tablet (500 mg total) by mouth daily.   PREDNISOLONE ACETATE (PRED FORTE) 1 % OPHTHALMIC SUSPENSION    Place 1 drop into the left eye 2 (two) times daily.   SODIUM CHLORIDE (MURO 128) 5 % OPHTHALMIC SOLUTION    Place 1 drop into the left eye 4 (four) times daily.   THIORIDAZINE (MELLARIL) 100 MG TABLET    Take 100 mg by mouth at  bedtime.  Modified Medications   No medications on file  Discontinued Medications   No medications on file    Physical Exam:  Vitals:   10/16/23 1405  BP: 124/80  Pulse: 76  Resp: 17  Temp: 97.6 F (36.4 C)  SpO2: 99%  Weight: 178 lb 9.6 oz (81 kg)  Height: 5\' 2"  (1.575 m)   Body mass index is 32.67 kg/m. Wt Readings from Last 3 Encounters:  10/16/23 178 lb 9.6 oz (81 kg)  06/29/23 179 lb (81.2 kg)  06/01/23 172 lb (78 kg)    Physical Exam Constitutional:      General: She is not in acute distress.    Appearance: She is well-developed. She is not diaphoretic.  HENT:     Head: Normocephalic and atraumatic.     Mouth/Throat:     Pharynx: No oropharyngeal exudate.  Eyes:     Conjunctiva/sclera: Conjunctivae normal.     Pupils: Pupils are equal, round, and reactive to light.  Cardiovascular:     Rate and Rhythm: Normal rate and regular rhythm.     Heart sounds: Normal heart sounds.  Pulmonary:     Effort: Pulmonary effort is normal.     Breath sounds: Normal breath sounds.  Abdominal:     General: Bowel sounds are normal.     Palpations: Abdomen is soft.  Musculoskeletal:     Cervical back: Normal range of motion and neck supple.     Right lower leg: No edema.     Left lower leg: No edema.  Skin:    General: Skin is warm and dry.  Neurological:     Mental Status: She is alert.  Psychiatric:        Mood and Affect: Mood normal.     Labs reviewed: Basic Metabolic Panel: Recent Labs    02/02/23 1419 06/01/23 1343 09/29/23 1119  NA 145 144 141  K 3.3* 3.4* 4.3  CL 105 107 104  CO2 24 26 26   GLUCOSE 105* 138* 108*  BUN 13 7 15   CREATININE 0.90 0.93 1.15*  CALCIUM 9.2 9.5 9.5   Liver Function Tests: Recent Labs    01/30/23 1004 06/01/23 1343 09/29/23 1119  AST 19 16 21   ALT 18 17 21   BILITOT 0.4 0.6 0.4  PROT 6.3 6.9 7.2   No results for input(s): "LIPASE", "AMYLASE" in the last 8760 hours. No results for input(s): "AMMONIA" in the last  8760 hours. CBC: Recent Labs    01/30/23 1004 06/01/23 1343 09/29/23 1119  WBC 8.9 8.6 7.4  NEUTROABS 5,313 5,151 3,204  HGB 12.6 13.3 13.6  HCT 37.8 39.4 41.8  MCV 84.6 84.5 87.1  PLT 163  164 168   Lipid Panel: Recent Labs    01/30/23 1004  CHOL 128  HDL 49*  LDLCALC 56  TRIG 161  CHOLHDL 2.6   TSH: No results for input(s): "TSH" in the last 8760 hours. A1C: Lab Results  Component Value Date   HGBA1C 6.2 (H) 09/29/2023     Assessment/Plan 1. Essential hypertension (Primary) -Blood pressure well controlled, goal bp <140/90 Continue current medications and dietary modifications follow metabolic panel - BASIC METABOLIC PANEL WITH GFR  2. Tachycardia Well controlled on atenolol  3. Type 2 diabetes mellitus with other specified complication, without long-term current use of insulin (HCC) -diet controlled -Encouraged dietary compliance, routine foot care/monitoring and to keep up with diabetic eye exams through ophthalmology   4. Mixed hyperlipidemia -continues on lipitor  5. Schizophrenia, unspecified type (HCC) -mood has been stable, continues on thioridazine and followed by psych  6. Smoker -smoking cessation encouraged    Return in about 6 months (around 04/14/2024) for routine follow up, labs at appt .  Janene Harvey. Biagio Borg [facility] [phone]

## 2023-10-17 ENCOUNTER — Other Ambulatory Visit: Payer: Self-pay | Admitting: Nurse Practitioner

## 2023-10-17 DIAGNOSIS — E782 Mixed hyperlipidemia: Secondary | ICD-10-CM

## 2023-10-17 LAB — BASIC METABOLIC PANEL WITH GFR
BUN/Creatinine Ratio: 10 (calc) (ref 6–22)
BUN: 13 mg/dL (ref 7–25)
CO2: 24 mmol/L (ref 20–32)
Calcium: 9.7 mg/dL (ref 8.6–10.4)
Chloride: 105 mmol/L (ref 98–110)
Creat: 1.24 mg/dL — ABNORMAL HIGH (ref 0.60–1.00)
Glucose, Bld: 116 mg/dL (ref 65–139)
Potassium: 4.2 mmol/L (ref 3.5–5.3)
Sodium: 141 mmol/L (ref 135–146)
eGFR: 47 mL/min/{1.73_m2} — ABNORMAL LOW (ref >=60)

## 2023-10-21 ENCOUNTER — Other Ambulatory Visit: Payer: Self-pay

## 2023-10-21 DIAGNOSIS — I10 Essential (primary) hypertension: Secondary | ICD-10-CM

## 2023-11-19 ENCOUNTER — Inpatient Hospital Stay: Admission: RE | Admit: 2023-11-19 | Payer: Medicare Other | Source: Ambulatory Visit

## 2023-12-02 DIAGNOSIS — H2589 Other age-related cataract: Secondary | ICD-10-CM | POA: Diagnosis not present

## 2023-12-02 DIAGNOSIS — H44511 Absolute glaucoma, right eye: Secondary | ICD-10-CM | POA: Diagnosis not present

## 2023-12-02 DIAGNOSIS — H401123 Primary open-angle glaucoma, left eye, severe stage: Secondary | ICD-10-CM | POA: Diagnosis not present

## 2023-12-07 ENCOUNTER — Other Ambulatory Visit: Payer: Medicare Other

## 2023-12-07 ENCOUNTER — Ambulatory Visit (INDEPENDENT_AMBULATORY_CARE_PROVIDER_SITE_OTHER): Payer: Medicare Other | Admitting: Nurse Practitioner

## 2023-12-07 VITALS — BP 134/88 | HR 64 | Temp 97.9°F | Ht 62.0 in | Wt 176.0 lb

## 2023-12-07 DIAGNOSIS — Z1231 Encounter for screening mammogram for malignant neoplasm of breast: Secondary | ICD-10-CM

## 2023-12-07 DIAGNOSIS — I10 Essential (primary) hypertension: Secondary | ICD-10-CM

## 2023-12-07 DIAGNOSIS — Z0000 Encounter for general adult medical examination without abnormal findings: Secondary | ICD-10-CM | POA: Diagnosis not present

## 2023-12-07 NOTE — Progress Notes (Signed)
 Subjective:   [name] is a 73 y.o. female who presents for Medicare Annual (Subsequent) preventive examination.  Visit Complete: In person AWV   Cardiac Risk Factors include: advanced age (>52men, >82 women);hypertension;sedentary lifestyle;smoking/ tobacco exposure;dyslipidemia;obesity (BMI >30kg/m2)     Objective:    Today's Vitals   12/07/23 1107  BP: 134/88  Pulse: 64  Temp: 97.9 F (36.6 C)  TempSrc: Temporal  Weight: 176 lb (79.8 kg)  Height: 5\' 2"  (1.575 m)   Body mass index is 32.19 kg/m.     12/07/2023   11:07 AM 10/16/2023    2:03 PM 09/21/2023    9:37 AM 06/01/2023   11:15 AM 02/02/2023   11:35 AM 10/02/2022    4:47 PM 08/04/2022    7:55 AM  Advanced Directives  Does Patient Have a Medical Advance Directive? Yes Yes Yes Yes Yes Yes Yes  Type of Advance Directive Out of facility DNR (pink MOST or yellow form) Out of facility DNR (pink MOST or yellow form) Out of facility DNR (pink MOST or yellow form) Out of facility DNR (pink MOST or yellow form) Out of facility DNR (pink MOST or yellow form) Out of facility DNR (pink MOST or yellow form) Out of facility DNR (pink MOST or yellow form)  Does patient want to make changes to medical advance directive? No - Patient declined No - Patient declined No - Patient declined No - Patient declined No - Patient declined No - Patient declined No - Patient declined  Pre-existing out of facility DNR order (yellow form or pink MOST form) Yellow form placed in chart (order not valid for inpatient use) Yellow form placed in chart (order not valid for inpatient use) Yellow form placed in chart (order not valid for inpatient use) Yellow form placed in chart (order not valid for inpatient use) Yellow form placed in chart (order not valid for inpatient use) Yellow form placed in chart (order not valid for inpatient use) Yellow form placed in chart (order not valid for inpatient use)    Current Medications (verified) Outpatient  Encounter Medications as of 12/07/2023  Medication Sig   atenolol (TENORMIN) 100 MG tablet Take 1 tablet by mouth once daily   atorvastatin (LIPITOR) 40 MG tablet Take 1 tablet by mouth once daily   CALCIUM PO Take 600 mg by mouth daily.   Cholecalciferol (VITAMIN D3 SUPER STRENGTH) 50 MCG (2000 UT) TABS Take 1 tablet by mouth daily.   losartan (COZAAR) 25 MG tablet Take 1 tablet by mouth once daily   magnesium gluconate (MAGONATE) 500 MG tablet Take 1 tablet (500 mg total) by mouth daily.   sodium chloride (MURO 128) 5 % ophthalmic solution Place 1 drop into the left eye 4 (four) times daily.   thioridazine (MELLARIL) 100 MG tablet Take 100 mg by mouth at bedtime.   prednisoLONE acetate (PRED FORTE) 1 % ophthalmic suspension Place 1 drop into the left eye 2 (two) times daily. (Patient not taking: Reported on 12/07/2023)   No facility-administered encounter medications on file as of 12/07/2023.    Allergies (verified) Patient has no known allergies.   History: Past Medical History:  Diagnosis Date   Chlamydia trachomatis infection of unspecified genitourinary site 12/18/2011   Cough    Disorder of bone and cartilage, unspecified    Edema    Hypercalcemia 02/14/2015   Hyperlipemia    Hyperparathyroidism    Hypertension    Osteopenia    Other abnormal blood chemistry  Schizophrenia (HCC)    "little bit" (02/23/2015)   Tachycardia, unspecified 05/29/2010   Tobacco use disorder    Unspecified glaucoma(365.9)    Vitamin D deficiency    Past Surgical History:  Procedure Laterality Date   BREAST EXCISIONAL BIOPSY Left    EYE SURGERY Left 02/2022   NO PAST SURGERIES     PARATHYROIDECTOMY N/A 03/15/2015   Procedure: PARATHYROIDECTOMY AND NECK EXPLORATION;  Surgeon: Darnell Level, MD;  Location: [facility] OR;  Service: General;  Laterality: N/A;   REFRACTIVE SURGERY Left 08/2021   Family History  Problem Relation Age of Onset   Cancer Mother    Cancer Brother        Throat   Hypertension  Other    Diabetes Other    Social History   Socioeconomic History   Marital status: Widowed    Spouse name: Not on file   Number of children: Not on file   Years of education: Not on file   Highest education level: Not on file  Occupational History   Not on file  Tobacco Use   Smoking status: Some Days    Current packs/day: 0.12    Average packs/day: 0.1 packs/day for 47.0 years (5.6 ttl pk-yrs)    Types: Cigarettes   Smokeless tobacco: Never   Tobacco comments:    Smokes every now and then, not everyday  Vaping Use   Vaping status: Never Used  Substance and Sexual Activity   Alcohol use: Yes    Comment: 1 beer off/on   Drug use: Never   Sexual activity: Yes  Other Topics Concern   Not on file  Social History Narrative   Not on file   Social Drivers of Health   Financial Resource Strain: Low Risk  (07/17/2020)   Received from [facility], [facility]   Overall Financial Resource Strain (CARDIA)    Difficulty of Paying Living Expenses: Not hard at all  Food Insecurity: Low Risk  (09/06/2020)   Received from [facility], [facility]   Food Insecurity    Within the past 12 months, did the food you bought just not last and you didn't have money to get more?: No    Within the past 12 months, did you worry that your food would run out before you got money to buy more?: No  Transportation Needs: Low Risk  (09/06/2020)   [facility] from [facility], [facility]   Transportation Needs    Lack of transportation: No  Physical Activity: Inactive (02/17/2018)   Exercise Vital Sign    Days of Exercise per Week: 0 days    Minutes of Exercise per Session: 0 min  Stress: Stress Concern Present (07/17/2020)   Received from [facility], [facility] - Occupational Stress Questionnaire    Feeling of Stress : To some extent  Social Connections: Unknown (02/04/2022)   Received  from [facility], [facility]   Social Network    Social Network: Not on file    Tobacco Counseling Ready to quit: Not Answered Counseling given: Not Answered Tobacco comments: Smokes every now and then, not everyday   Clinical Intake:  Pre-visit preparation completed: Yes  Pain : No/denies pain     BMI - recorded: 32 Nutritional Risks: None Diabetes: No  How often do you need to have someone help you when you read instructions, pamphlets, or other written materials from your doctor or pharmacy?:  5 - Always         Activities of Daily Living    12/07/2023   11:36 AM  In your present state of health, do you have any difficulty performing the following activities:  Hearing? 0  Vision? 1  Difficulty concentrating or making decisions? 0  Walking or climbing stairs? 1  Dressing or bathing? 0  Doing errands, shopping? 0  Preparing Food and eating ? N  Using the Toilet? N  In the past six months, have you accidently leaked urine? N  Do you have problems with loss of bowel control? N  Managing your Medications? N  Managing your Finances? N  Housekeeping or managing your Housekeeping? N    Patient Care Team: [name], NP as PCP - General (Nurse Practitioner)  Indicate any recent Medical Services you may have received from other than [facility] providers in the past year (date may be approximate).     Assessment:   This is a routine wellness examination for Latora.  Hearing/Vision screen Hearing Screening - Comments:: No hearing issues  Vision Screening - Comments:: Last eye exam less than 12 months ago, March 2025. Dr. Nada Libman    Goals Addressed   None    Depression Screen    12/07/2023   11:31 AM 10/16/2023    2:01 PM 06/01/2023    1:08 PM 02/02/2023    1:36 PM 08/25/2022    1:23 PM 08/04/2022    8:38 AM 04/25/2022    2:39 PM  PHQ 2/9 Scores  PHQ - 2 Score 0 1 0 0 2 0 0  PHQ- 9 Score     3      Fall Risk    12/07/2023   11:31 AM 10/16/2023     2:01 PM 06/01/2023    1:08 PM 02/02/2023    1:36 PM 08/25/2022    1:23 PM  Fall Risk   Falls in the past year? 0 0 0 0 0  Number falls in past yr: 0 0 0 0   Injury with Fall? 0 0 0 0   Risk for fall due to : No Fall Risks  No Fall Risks No Fall Risks   Follow up Falls evaluation completed  Falls evaluation completed Falls evaluation completed Falls evaluation completed    MEDICARE RISK AT HOME: Medicare Risk at Home Any stairs in or around the home?: No Home free of loose throw rugs in walkways, pet beds, electrical cords, etc?: Yes Adequate lighting in your home to reduce risk of falls?: Yes Life alert?: No Use of a cane, walker or w/c?: Yes Grab bars in the bathroom?: No Shower chair or bench in shower?: No Elevated toilet seat or a handicapped toilet?: Yes  TIMED UP AND GO:  Was the test performed?  No    Cognitive Function:    02/17/2018    9:54 AM 01/02/2016    3:05 PM 03/15/2014    2:35 PM  MMSE - Mini Mental State Exam  Not completed:  --   Orientation to time 5 5 5   Orientation to Place 5 5 5   Registration 3 3 3   Attention/ Calculation 4 4 4   Recall 3 2 1   Language- name 2 objects 2 2 2   Language- repeat 1 1 1   Language- follow 3 step command 3 3 3   Language- read & follow direction 1 1 1   Write a sentence 1 1 1   Copy design 1 0 0  Total score 29 27 26  12/07/2023   11:38 AM 08/04/2022    8:41 AM 07/09/2021    4:27 PM  6CIT Screen  What Year? 0 points 0 points 0 points  What month? 0 points 0 points 0 points  What time? 0 points 0 points 0 points  Count back from 20 0 points 0 points 0 points  Months in reverse 4 points 0 points 4 points  Repeat phrase 4 points 6 points 2 points  Total Score 8 points 6 points 6 points    Immunizations Immunization History  Administered Date(s) Administered   Fluad Quad(high Dose 65+) 05/11/2019, 07/08/2021, 08/25/2022   Fluad Trivalent(High Dose 65+) 06/01/2023   Influenza, High Dose Seasonal PF 08/20/2017    Influenza,inj,Quad PF,6+ Mos 06/14/2013, 07/18/2014, 05/15/2016   PFIZER(Purple Top)SARS-COV-2 Vaccination 01/05/2020, 01/26/2020   PNEUMOCOCCAL CONJUGATE-20 04/25/2022   Pneumococcal Conjugate-13 03/15/2014, 02/17/2018   Pneumococcal Polysaccharide-23 01/02/2016   Td 09/23/1995, 04/21/2023   Tdap 12/18/2011, 07/10/2019, 04/21/2023   Unspecified SARS-COV-2 Vaccination 02/18/2023   Zoster Recombinant(Shingrix) 02/18/2023, 04/21/2023    TDAP status: Up to date  Flu Vaccine status: Up to date  Pneumococcal vaccine status: Up to date  Covid-19 vaccine status: Information provided on how to obtain vaccines.   Qualifies for Shingles Vaccine? Yes   Zostavax completed No   Shingrix Completed?: Yes  Screening Tests Health Maintenance  Topic Date Due   COVID-19 Vaccine (4 - 2024-25 season) 05/24/2023   Diabetic kidney evaluation - Urine ACR  02/02/2024   MAMMOGRAM  10/09/2024   Diabetic kidney evaluation - eGFR measurement  10/15/2024   Medicare Annual Wellness (AWV)  12/06/2024   Fecal DNA (Cologuard)  02/16/2026   DTaP/Tdap/Td (6 - Td or Tdap) 04/20/2033   Pneumonia Vaccine 71+ Years old  Completed   INFLUENZA VACCINE  Completed   DEXA SCAN  Completed   Hepatitis C Screening  Completed   Zoster Vaccines- Shingrix  Completed   HPV VACCINES  Aged Out    Health Maintenance  Health Maintenance Due  Topic Date Due   COVID-19 Vaccine (4 - 2024-25 season) 05/24/2023    Colorectal cancer screening: Type of screening: Cologuard. Completed 2024. Repeat every 3 years  Mammogram status: Completed 09/2022. Repeat every year  Bone Density status: Ordered 01/2023. Pt provided with contact info and advised to call to schedule appt.  Lung Cancer Screening: (Low Dose CT Chest recommended if Age 83-80 years, 20 pack-year currently smoking OR have quit w/in 15years.) does qualify.   Lung Cancer Screening Referral: done  Additional Screening:  Hepatitis C Screening: does qualify;  Completed   Vision Screening: Recommended annual ophthalmology exams for early detection of glaucoma and other disorders of the eye. Is the patient up to date with their annual eye exam?  Yes  Who is the provider or what is the name of the office in which the patient attends annual eye exams? [facility] If pt is not established with a provider, would they like to be referred to a provider to establish care? No .   Dental Screening: Recommended annual dental exams for proper oral hygiene   Community Resource Referral / Chronic Care Management: CRR required this visit?  No   CCM required this visit?  No     Plan:     I have personally reviewed and noted the following in the patient's chart:   Medical and social history Use of alcohol, tobacco or illicit drugs  Current medications and supplements including opioid prescriptions. Patient is not currently taking opioid prescriptions.  Functional ability and status Nutritional status Physical activity Advanced directives List of other physicians Hospitalizations, surgeries, and ER visits in previous 12 months Vitals Screenings to include cognitive, depression, and falls Referrals and appointments  In addition, I have reviewed and discussed with patient certain preventive protocols, quality metrics, and best practice recommendations. A written personalized care plan for preventive services as well as general preventive health recommendations were provided to patient.     [name], NP   12/07/2023

## 2023-12-07 NOTE — Patient Instructions (Signed)
  Ms. Patricelli , Thank you for taking time to come for your Medicare Wellness Visit. I appreciate your ongoing commitment to your health goals. Please review the following plan we discussed and let me know if I can assist you in the future.   Call and reschedule lung cancer screening and call to schedule mammogram  [phone]   This is a list of the screening recommended for you and due dates:  Health Maintenance  Topic Date Due   COVID-19 Vaccine (4 - 2024-25 season) 05/24/2023   Yearly kidney health urinalysis for diabetes  02/02/2024   Mammogram  10/09/2024   Yearly kidney function blood test for diabetes  10/15/2024   Medicare Annual Wellness Visit  12/06/2024   Cologuard (Stool DNA test)  02/16/2026   DTaP/Tdap/Td vaccine (6 - Td or Tdap) 04/20/2033   Pneumonia Vaccine  Completed   Flu Shot  Completed   DEXA scan (bone density measurement)  Completed   Hepatitis C Screening  Completed   Zoster (Shingles) Vaccine  Completed   HPV Vaccine  Aged Out

## 2023-12-08 LAB — BASIC METABOLIC PANEL WITH GFR
BUN/Creatinine Ratio: 9 (calc) (ref 6–22)
BUN: 10 mg/dL (ref 7–25)
CO2: 24 mmol/L (ref 20–32)
Calcium: 9 mg/dL (ref 8.6–10.4)
Chloride: 106 mmol/L (ref 98–110)
Creat: 1.06 mg/dL — ABNORMAL HIGH (ref 0.60–1.00)
Glucose, Bld: 186 mg/dL — ABNORMAL HIGH (ref 65–139)
Potassium: 3.9 mmol/L (ref 3.5–5.3)
Sodium: 140 mmol/L (ref 135–146)
eGFR: 56 mL/min/{1.73_m2} — ABNORMAL LOW (ref >=60)

## 2023-12-28 ENCOUNTER — Other Ambulatory Visit: Payer: Self-pay | Admitting: Nurse Practitioner

## 2023-12-28 DIAGNOSIS — E1169 Type 2 diabetes mellitus with other specified complication: Secondary | ICD-10-CM

## 2023-12-28 DIAGNOSIS — I10 Essential (primary) hypertension: Secondary | ICD-10-CM

## 2024-01-26 DIAGNOSIS — H2589 Other age-related cataract: Secondary | ICD-10-CM | POA: Diagnosis not present

## 2024-01-26 DIAGNOSIS — H59012 Keratopathy (bullous aphakic) following cataract surgery, left eye: Secondary | ICD-10-CM | POA: Diagnosis not present

## 2024-01-26 DIAGNOSIS — H401123 Primary open-angle glaucoma, left eye, severe stage: Secondary | ICD-10-CM | POA: Diagnosis not present

## 2024-01-26 DIAGNOSIS — H44511 Absolute glaucoma, right eye: Secondary | ICD-10-CM | POA: Diagnosis not present

## 2024-02-29 ENCOUNTER — Telehealth: Payer: Self-pay | Admitting: Acute Care

## 2024-02-29 ENCOUNTER — Telehealth: Payer: Self-pay

## 2024-02-29 ENCOUNTER — Other Ambulatory Visit: Payer: Self-pay

## 2024-02-29 DIAGNOSIS — Z87891 Personal history of nicotine dependence: Secondary | ICD-10-CM

## 2024-02-29 DIAGNOSIS — F17200 Nicotine dependence, unspecified, uncomplicated: Secondary | ICD-10-CM

## 2024-02-29 DIAGNOSIS — Z122 Encounter for screening for malignant neoplasm of respiratory organs: Secondary | ICD-10-CM

## 2024-02-29 NOTE — Telephone Encounter (Signed)
 Copied from CRM [id number]. Topic: Clinical - Request for Lab/Test Order >> Feb 26, 2024  4:03 PM Tisa Forester wrote: Reason for CRM: want to know if [facility] want patient to get a chest xray reason she is a smoker , and if need an order to send to  imaging , address [address]  Patient call back number [phone]

## 2024-02-29 NOTE — Telephone Encounter (Signed)
 I called patient and left a detailed voicemail with providers response and I emphasized that she can expect a phone call from someone to schedule CT

## 2024-02-29 NOTE — Telephone Encounter (Signed)
 She is due for her yearly CT chest due to smoking hx, I have placed referral for this and she should be hearing from pulmonary team

## 2024-02-29 NOTE — Telephone Encounter (Signed)
 Left patient VM to call for rescheduling of her annual LDCT.  Call [phone] to schedule

## 2024-02-29 NOTE — Telephone Encounter (Signed)
 Last office visit was 12/07/2023.   Please advise

## 2024-03-04 ENCOUNTER — Ambulatory Visit
Admission: RE | Admit: 2024-03-04 | Discharge: 2024-03-04 | Disposition: A | Discharge status: Home / Self Care | Source: Ambulatory Visit | Attending: Nurse Practitioner | Admitting: Nurse Practitioner

## 2024-03-04 DIAGNOSIS — Z1231 Encounter for screening mammogram for malignant neoplasm of breast: Secondary | ICD-10-CM

## 2024-03-30 ENCOUNTER — Other Ambulatory Visit: Payer: Self-pay | Admitting: Nurse Practitioner

## 2024-03-30 DIAGNOSIS — E1169 Type 2 diabetes mellitus with other specified complication: Secondary | ICD-10-CM

## 2024-03-30 DIAGNOSIS — I10 Essential (primary) hypertension: Secondary | ICD-10-CM

## 2024-03-30 DIAGNOSIS — R000 Tachycardia, unspecified: Secondary | ICD-10-CM

## 2024-03-30 NOTE — Telephone Encounter (Signed)
 Copied from CRM [id number]. Topic: Clinical - Medication Refill >> Mar 30, 2024  3:59 PM Cherylann S wrote: Medication: atenolol  (TENORMIN ) 100 MG tablet  losartan  (COZAAR ) 25 MG tablet  Has the patient contacted their pharmacy? Yes (Agent: If no, request that the patient contact the pharmacy for the refill. If patient does not wish to contact the pharmacy document the reason why and proceed with request.) (Agent: If yes, when and what did the pharmacy advise?)  This is the patient's preferred pharmacy:  [facility] [address], [place] - [address] [place] [address] Phone: [phone] Fax: [phone]  Is this the correct pharmacy for this prescription? Yes If no, delete pharmacy and type the correct one.   Has the prescription been filled recently? No  Is the patient out of the medication? Yes  Has the patient been seen for an appointment in the last year OR does the patient have an upcoming appointment? Yes  Can we respond through MyChart? No  Agent: Please be advised that Rx refills may take up to 3 business days. We ask that you follow-up with your pharmacy.

## 2024-03-31 NOTE — Telephone Encounter (Signed)
 Duplicate. Was sent into pharmacy on 03/30/2024

## 2024-04-18 ENCOUNTER — Encounter: Payer: Self-pay | Admitting: Nurse Practitioner

## 2024-04-18 ENCOUNTER — Ambulatory Visit (INDEPENDENT_AMBULATORY_CARE_PROVIDER_SITE_OTHER): Payer: Medicare Other | Admitting: Nurse Practitioner

## 2024-04-18 VITALS — BP 140/84 | HR 76 | Temp 97.8°F | Resp 12 | Ht 62.0 in | Wt 173.6 lb

## 2024-04-18 DIAGNOSIS — F209 Schizophrenia, unspecified: Secondary | ICD-10-CM

## 2024-04-18 DIAGNOSIS — I10 Essential (primary) hypertension: Secondary | ICD-10-CM

## 2024-04-18 DIAGNOSIS — F17200 Nicotine dependence, unspecified, uncomplicated: Secondary | ICD-10-CM | POA: Diagnosis not present

## 2024-04-18 DIAGNOSIS — M8580 Other specified disorders of bone density and structure, unspecified site: Secondary | ICD-10-CM | POA: Diagnosis not present

## 2024-04-18 DIAGNOSIS — H409 Unspecified glaucoma: Secondary | ICD-10-CM

## 2024-04-18 DIAGNOSIS — E1169 Type 2 diabetes mellitus with other specified complication: Secondary | ICD-10-CM | POA: Diagnosis not present

## 2024-04-18 DIAGNOSIS — E119 Type 2 diabetes mellitus without complications: Secondary | ICD-10-CM | POA: Insufficient documentation

## 2024-04-18 DIAGNOSIS — R000 Tachycardia, unspecified: Secondary | ICD-10-CM | POA: Insufficient documentation

## 2024-04-18 DIAGNOSIS — E782 Mixed hyperlipidemia: Secondary | ICD-10-CM | POA: Diagnosis not present

## 2024-04-18 NOTE — Progress Notes (Signed)
 Careteam: Patient Care Team: [name], NP as PCP - General (Nurse Practitioner)  PLACE OF SERVICE:  [facility]  Advanced Directive information    No Known Allergies  Chief Complaint  Patient presents with   Medical Management of Chronic Issues    6 month follow up / no concerns// pt bp is high pt stated she has taken her medication.     HPI:  Discussed the use of AI scribe software for clinical note transcription with the patient, who gave verbal consent to proceed.  History of Present Illness [name] is a 73 year old female with hypertension and osteopenia who presents for a routine follow-up.  She did not check her blood pressure at home this week but takes her medications as prescribed. She is currently on atenolol  100 mg twice daily and losartan  25 mg daily for blood pressure management. No addition of salt to her diet, and she avoids frozen dinners and canned foods. Occasionally, she smokes when stressed.  She has a history of osteopenia and is taking calcium  and vitamin D  supplements. She also takes Lipitor 40 mg for cholesterol management.   She exercises at home  She is currently using eye drops as prescribed and has an upcoming appointment with the eye doctor in September. She reports being 'totally blind' but can see a little, though not clearly due to glaucoma.  She has a history of schizophrenia for which she takes Thorazine and is still seeing a psychiatrist. No hallucinations or feelings of wanting to harm herself or others.  She lives independently with a roommate and sometimes has her blood pressure checked by visitors, including her sister, who is currently dealing with her own health issues.    Review of Systems:  Review of Systems  Constitutional:  Negative for chills, fever and weight loss.  HENT:  Negative for tinnitus.   Eyes:        Blind, walks with cane   Respiratory:  Negative for cough, sputum production and shortness of  breath.   Cardiovascular:  Negative for chest pain, palpitations and leg swelling.  Gastrointestinal:  Negative for abdominal pain, constipation, diarrhea and heartburn.  Genitourinary:  Negative for dysuria, frequency and urgency.  Musculoskeletal:  Negative for back pain, falls, joint pain and myalgias.  Skin: Negative.   Neurological:  Negative for dizziness and headaches.  Psychiatric/Behavioral:  Negative for depression and memory loss. The patient does not have insomnia.     Past Medical History:  Diagnosis Date   Chlamydia trachomatis infection of unspecified genitourinary site 12/18/2011   Cough    Disorder of bone and cartilage, unspecified    Edema    Hypercalcemia 02/14/2015   Hyperlipemia    Hyperparathyroidism    Hypertension    Osteopenia    Other abnormal blood chemistry    Schizophrenia (HCC)    little bit (02/23/2015)   Tachycardia, unspecified 05/29/2010   Tobacco use disorder    Unspecified glaucoma(365.9)    Vitamin D  deficiency    Past Surgical History:  Procedure Laterality Date   BREAST EXCISIONAL BIOPSY Left    EYE SURGERY Left 02/2022   NO PAST SURGERIES     PARATHYROIDECTOMY N/A 03/15/2015   Procedure: PARATHYROIDECTOMY AND NECK EXPLORATION;  Surgeon: Krystal Spinner, MD;  Location: [facility] OR;  Service: General;  Laterality: N/A;   REFRACTIVE SURGERY Left 08/2021   Social History:   reports that she has been smoking cigarettes. She has a 5.6 pack-year smoking history.  She has never used smokeless tobacco. She reports current alcohol use. She reports that she does not use drugs.  Family History  Problem Relation Age of Onset   Cancer Mother    Throat cancer Brother    Hypertension Other    Diabetes Other    BRCA 1/2 Neg Hx    Breast cancer Neg Hx     Medications: Patient's Medications  New Prescriptions   No medications on file  Previous Medications   ATENOLOL  (TENORMIN ) 100 MG TABLET    Take 1 tablet by mouth once daily   ATORVASTATIN   (LIPITOR) 40 MG TABLET    Take 1 tablet by mouth once daily   CALCIUM  PO    Take 600 mg by mouth daily.   CHOLECALCIFEROL (VITAMIN D3 SUPER STRENGTH) 50 MCG (2000 UT) TABS    Take 1 tablet by mouth daily.   LOSARTAN  (COZAAR ) 25 MG TABLET    Take 1 tablet by mouth once daily   MAGNESIUM  GLUCONATE (MAGONATE) 500 MG TABLET    Take 1 tablet (500 mg total) by mouth daily.   PREDNISOLONE ACETATE (PRED FORTE) 1 % OPHTHALMIC SUSPENSION    Place 1 drop into the left eye 2 (two) times daily.   SODIUM CHLORIDE  (MURO 128) 5 % OPHTHALMIC SOLUTION    Place 1 drop into the left eye 4 (four) times daily.   THIORIDAZINE  (MELLARIL ) 100 MG TABLET    Take 100 mg by mouth at bedtime.  Modified Medications   No medications on file  Discontinued Medications   No medications on file    Physical Exam:  Vitals:   04/18/24 1059 04/18/24 1104  BP: (!) 142/82 (!) 140/84  Pulse: 76   Resp: 12   Temp: 97.8 F (36.6 C)   SpO2: 95%   Weight: 173 lb 9.6 oz (78.7 kg)   Height: 5' 2 (1.575 m)    Body mass index is 31.75 kg/m. Wt Readings from Last 3 Encounters:  04/18/24 173 lb 9.6 oz (78.7 kg)  12/07/23 176 lb (79.8 kg)  10/16/23 178 lb 9.6 oz (81 kg)    Physical Exam Constitutional:      General: She is not in acute distress.    Appearance: She is well-developed. She is not diaphoretic.  HENT:     Head: Normocephalic and atraumatic.     Mouth/Throat:     Pharynx: No oropharyngeal exudate.  Eyes:     Conjunctiva/sclera: Conjunctivae normal.     Pupils: Pupils are equal, round, and reactive to light.  Cardiovascular:     Rate and Rhythm: Normal rate and regular rhythm.     Heart sounds: Normal heart sounds.  Pulmonary:     Effort: Pulmonary effort is normal.     Breath sounds: Normal breath sounds.  Abdominal:     General: Bowel sounds are normal.     Palpations: Abdomen is soft.  Musculoskeletal:     Cervical back: Normal range of motion and neck supple.     Right lower leg: No edema.      Left lower leg: No edema.  Skin:    General: Skin is warm and dry.  Neurological:     Mental Status: She is alert.  Psychiatric:        Mood and Affect: Mood normal.     Labs reviewed: Basic Metabolic Panel: Recent Labs    09/29/23 1119 10/16/23 1422 12/07/23 1203  NA 141 141 140  K 4.3 4.2 3.9  CL 104 105  106  CO2 26 24 24   GLUCOSE 108* 116 186*  BUN 15 13 10   CREATININE 1.15* 1.24* 1.06*  CALCIUM  9.5 9.7 9.0   Liver Function Tests: Recent Labs    06/01/23 1343 09/29/23 1119  AST 16 21  ALT 17 21  BILITOT 0.6 0.4  PROT 6.9 7.2   No results for input(s): LIPASE, AMYLASE in the last 8760 hours. No results for input(s): AMMONIA in the last 8760 hours. CBC: Recent Labs    06/01/23 1343 09/29/23 1119  WBC 8.6 7.4  NEUTROABS 5,151 3,204  HGB 13.3 13.6  HCT 39.4 41.8  MCV 84.5 87.1  PLT 164 168   Lipid Panel: No results for input(s): CHOL, HDL, LDLCALC, TRIG, CHOLHDL, LDLDIRECT in the last 8760 hours. TSH: No results for input(s): TSH in the last 8760 hours. A1C: Lab Results  Component Value Date   HGBA1C 6.2 (H) 09/29/2023     Assessment/Plan  Essential hypertension Assessment & Plan: -Blood pressure elevated today but feel like generally well controlled at home Wants to check at home before medication change -No changes to medications today  -will have pt continue to monitor home bp goal <140/90, to notify if readings remain high on 3 different days  -follow metabolic panel    Orders: -     CBC with Differential/Platelet -     Comprehensive metabolic panel with GFR  Type 2 diabetes mellitus with other specified complication, without long-term current use of insulin (HCC) Assessment & Plan: Diet controlled, Encouraged dietary compliance, routine foot care/monitoring and to keep up with diabetic eye exams through ophthalmology    Orders: -     Microalbumin / creatinine urine ratio -     Hemoglobin  A1c  Tachycardia Assessment & Plan: Controlled on atenolol  100 mg BID   Mixed hyperlipidemia Assessment & Plan: Continues on atorvastatin  and dietary modifications   Orders: -     Lipid panel  Schizophrenia, unspecified type (HCC) Assessment & Plan: Controlled on medication, continues with close psych follow up.    Smoker Assessment & Plan: Encouraged cessation   Osteopenia, unspecified location Assessment & Plan: Recommended to take calcium  600 mg twice daily with Vitamin D  2000 units daily and weight bearing activity 30 mins/5 days a week    Glaucoma of both eyes, unspecified glaucoma type Assessment & Plan: Blind due to hx of noncompliance with drops.  -continues with close follow up with ophthalmology      Return in about 6 months (around 10/19/2024) for routine follow up.  Khamani Fairley K. Caro BODILY [facility] [phone]

## 2024-04-18 NOTE — Assessment & Plan Note (Signed)
 Diet controlled, Encouraged dietary compliance, routine foot care/monitoring and to keep up with diabetic eye exams through ophthalmology

## 2024-04-18 NOTE — Assessment & Plan Note (Signed)
Recommended to take calcium 600 mg twice daily with Vitamin D 2000 units daily and weight bearing activity 30 mins/5 days a week   

## 2024-04-18 NOTE — Patient Instructions (Signed)
 To check blood pressure 1 hour AFTER you have had your medication Make sure you have been sitting at least 5 mins  Record and let us  know.  Goal <140/90  Check blood pressure once a week for next 3 weeks and have someone call office to report the readings.

## 2024-04-18 NOTE — Assessment & Plan Note (Signed)
 Continues on atorvastatin  and dietary modifications

## 2024-04-18 NOTE — Assessment & Plan Note (Signed)
-  Blood pressure elevated today but feel like generally well controlled at home Wants to check at home before medication change -No changes to medications today  -will have pt continue to monitor home bp goal <140/90, to notify if readings remain high on 3 different days  -follow metabolic panel

## 2024-04-18 NOTE — Assessment & Plan Note (Signed)
 Blind due to hx of noncompliance with drops.  -continues with close follow up with ophthalmology

## 2024-04-18 NOTE — Assessment & Plan Note (Signed)
 Encouraged cessation.

## 2024-04-18 NOTE — Assessment & Plan Note (Signed)
 Controlled on medication, continues with close psych follow up.

## 2024-04-18 NOTE — Assessment & Plan Note (Signed)
 Controlled on atenolol  100 mg BID

## 2024-04-19 ENCOUNTER — Ambulatory Visit: Payer: Self-pay | Admitting: Nurse Practitioner

## 2024-04-19 LAB — CBC WITH DIFFERENTIAL/PLATELET
Absolute Lymphocytes: 2788 {cells}/uL (ref 850–3900)
Absolute Monocytes: 469 {cells}/uL (ref 200–950)
Basophils Absolute: 20 {cells}/uL (ref 0–200)
Basophils Relative: 0.3 %
Eosinophils Absolute: 68 {cells}/uL (ref 15–500)
Eosinophils Relative: 1 %
HCT: 42.5 % (ref 35.0–45.0)
Hemoglobin: 13.4 g/dL (ref 11.7–15.5)
MCH: 28 pg (ref 27.0–33.0)
MCHC: 31.5 g/dL — ABNORMAL LOW (ref 32.0–36.0)
MCV: 88.9 fL (ref 80.0–100.0)
MPV: 11.8 fL (ref 7.5–12.5)
Monocytes Relative: 6.9 %
Neutro Abs: 3454 {cells}/uL (ref 1500–7800)
Neutrophils Relative %: 50.8 %
Platelets: 181 Thousand/uL (ref 140–400)
RBC: 4.78 Million/uL (ref 3.80–5.10)
RDW: 13 % (ref 11.0–15.0)
Total Lymphocyte: 41 %
WBC: 6.8 Thousand/uL (ref 3.8–10.8)

## 2024-04-19 LAB — HEMOGLOBIN A1C
Hgb A1c MFr Bld: 5.9 % — ABNORMAL HIGH (ref <5.7)
Mean Plasma Glucose: 123 mg/dL
eAG (mmol/L): 6.8 mmol/L

## 2024-04-19 LAB — COMPREHENSIVE METABOLIC PANEL WITH GFR
AG Ratio: 1.5 (calc) (ref 1.0–2.5)
ALT: 16 U/L (ref 6–29)
AST: 18 U/L (ref 10–35)
Albumin: 4.1 g/dL (ref 3.6–5.1)
Alkaline phosphatase (APISO): 57 U/L (ref 37–153)
BUN: 14 mg/dL (ref 7–25)
CO2: 27 mmol/L (ref 20–32)
Calcium: 9.5 mg/dL (ref 8.6–10.4)
Chloride: 105 mmol/L (ref 98–110)
Creat: 0.9 mg/dL (ref 0.60–1.00)
Globulin: 2.8 g/dL (ref 1.9–3.7)
Glucose, Bld: 119 mg/dL — ABNORMAL HIGH (ref 65–99)
Potassium: 3.4 mmol/L — ABNORMAL LOW (ref 3.5–5.3)
Sodium: 143 mmol/L (ref 135–146)
Total Bilirubin: 0.6 mg/dL (ref 0.2–1.2)
Total Protein: 6.9 g/dL (ref 6.1–8.1)
eGFR: 68 mL/min/1.73m2 (ref >=60)

## 2024-04-19 LAB — LIPID PANEL
Cholesterol: 132 mg/dL (ref <200)
HDL: 52 mg/dL (ref >=50)
LDL Cholesterol (Calc): 59 mg/dL
Non-HDL Cholesterol (Calc): 80 mg/dL (ref <130)
Total CHOL/HDL Ratio: 2.5 (calc) (ref <5.0)
Triglycerides: 127 mg/dL (ref <150)

## 2024-04-27 ENCOUNTER — Encounter: Payer: Self-pay | Admitting: Acute Care

## 2024-06-01 DIAGNOSIS — H401123 Primary open-angle glaucoma, left eye, severe stage: Secondary | ICD-10-CM | POA: Diagnosis not present

## 2024-06-01 DIAGNOSIS — H44511 Absolute glaucoma, right eye: Secondary | ICD-10-CM | POA: Diagnosis not present

## 2024-06-01 DIAGNOSIS — H2589 Other age-related cataract: Secondary | ICD-10-CM | POA: Diagnosis not present

## 2024-07-19 NOTE — Progress Notes (Signed)
 Lasha C Hoffer                                          MRN: [id number]   07/19/2024   The [facility] Quality Team Specialist reviewed this patient medical record for the purposes of chart review for care gap closure. The following were reviewed: chart review for care gap closure-kidney health evaluation for diabetes:eGFR  and uACR.    [facility] Quality Team

## 2024-08-01 ENCOUNTER — Other Ambulatory Visit: Payer: Self-pay | Admitting: Nurse Practitioner

## 2024-08-01 DIAGNOSIS — R000 Tachycardia, unspecified: Secondary | ICD-10-CM

## 2024-08-01 DIAGNOSIS — I10 Essential (primary) hypertension: Secondary | ICD-10-CM

## 2024-09-20 ENCOUNTER — Other Ambulatory Visit: Payer: Self-pay | Admitting: Nurse Practitioner

## 2024-09-20 DIAGNOSIS — I10 Essential (primary) hypertension: Secondary | ICD-10-CM

## 2024-09-20 DIAGNOSIS — E1169 Type 2 diabetes mellitus with other specified complication: Secondary | ICD-10-CM

## 2024-10-11 ENCOUNTER — Other Ambulatory Visit: Payer: Self-pay | Admitting: Nurse Practitioner

## 2024-10-11 DIAGNOSIS — E782 Mixed hyperlipidemia: Secondary | ICD-10-CM

## 2024-10-21 ENCOUNTER — Ambulatory Visit: Payer: Self-pay | Admitting: Nurse Practitioner

## 2024-12-09 ENCOUNTER — Ambulatory Visit: Payer: Self-pay | Admitting: Nurse Practitioner
